# Patient Record
Sex: Female | Born: 1980 | Race: White | Hispanic: Yes | Marital: Married | Smoking: Never smoker
Health system: Southern US, Community
[De-identification: ages and names within clinical notes are randomized; demographics above are authoritative.]

## PROBLEM LIST (undated history)

## (undated) DIAGNOSIS — D649 Anemia, unspecified: Secondary | ICD-10-CM

## (undated) DIAGNOSIS — IMO0002 Reserved for concepts with insufficient information to code with codable children: Secondary | ICD-10-CM

## (undated) DIAGNOSIS — M329 Systemic lupus erythematosus, unspecified: Secondary | ICD-10-CM

---

## 2002-04-29 ENCOUNTER — Encounter (HOSPITAL_COMMUNITY): Admission: RE | Admit: 2002-04-29 | Discharge: 2002-05-01 | Payer: Self-pay | Admitting: *Deleted

## 2002-05-02 ENCOUNTER — Inpatient Hospital Stay (HOSPITAL_COMMUNITY): Admission: AD | Admit: 2002-05-02 | Discharge: 2002-05-05 | Payer: Self-pay | Admitting: Obstetrics and Gynecology

## 2004-11-24 DIAGNOSIS — Z8742 Personal history of other diseases of the female genital tract: Secondary | ICD-10-CM

## 2004-11-24 HISTORY — DX: Personal history of other diseases of the female genital tract: Z87.42

## 2005-10-08 ENCOUNTER — Ambulatory Visit (HOSPITAL_COMMUNITY): Admission: RE | Admit: 2005-10-08 | Discharge: 2005-10-08 | Payer: Self-pay | Admitting: *Deleted

## 2005-11-24 HISTORY — PX: DILATION AND CURETTAGE OF UTERUS: SHX78

## 2006-02-17 ENCOUNTER — Ambulatory Visit: Payer: Self-pay | Admitting: Obstetrics and Gynecology

## 2006-02-17 ENCOUNTER — Inpatient Hospital Stay (HOSPITAL_COMMUNITY): Admission: AD | Admit: 2006-02-17 | Discharge: 2006-02-19 | Payer: Self-pay | Admitting: *Deleted

## 2006-11-23 ENCOUNTER — Ambulatory Visit: Payer: Self-pay | Admitting: Gynecology

## 2006-11-23 ENCOUNTER — Encounter (INDEPENDENT_AMBULATORY_CARE_PROVIDER_SITE_OTHER): Payer: Self-pay | Admitting: *Deleted

## 2006-11-23 ENCOUNTER — Ambulatory Visit (HOSPITAL_COMMUNITY): Admission: AD | Admit: 2006-11-23 | Discharge: 2006-11-23 | Payer: Self-pay | Admitting: Obstetrics & Gynecology

## 2006-12-09 ENCOUNTER — Ambulatory Visit: Payer: Self-pay | Admitting: Obstetrics & Gynecology

## 2007-11-25 HISTORY — PX: LAPAROSCOPIC CHOLECYSTECTOMY: SUR755

## 2008-06-08 ENCOUNTER — Ambulatory Visit: Payer: Self-pay | Admitting: Nurse Practitioner

## 2008-06-08 ENCOUNTER — Ambulatory Visit: Payer: Self-pay | Admitting: *Deleted

## 2008-06-08 DIAGNOSIS — K5900 Constipation, unspecified: Secondary | ICD-10-CM | POA: Insufficient documentation

## 2008-06-08 DIAGNOSIS — K219 Gastro-esophageal reflux disease without esophagitis: Secondary | ICD-10-CM | POA: Insufficient documentation

## 2008-06-13 DIAGNOSIS — Z8711 Personal history of peptic ulcer disease: Secondary | ICD-10-CM

## 2008-06-13 LAB — CONVERTED CEMR LAB
AST: 17 units/L (ref 0–37)
Albumin: 4.6 g/dL (ref 3.5–5.2)
Alkaline Phosphatase: 79 units/L (ref 39–117)
Basophils Absolute: 0 10*3/uL (ref 0.0–0.1)
Basophils Relative: 0 % (ref 0–1)
CO2: 20 meq/L (ref 19–32)
Calcium: 9 mg/dL (ref 8.4–10.5)
Glucose, Bld: 85 mg/dL (ref 70–99)
HCT: 40.3 % (ref 36.0–46.0)
Helicobacter Pylori Antibody-IgG: 6.3 — ABNORMAL HIGH
Hemoglobin: 13.4 g/dL (ref 12.0–15.0)
Lymphocytes Relative: 31 % (ref 12–46)
Lymphs Abs: 2.9 10*3/uL (ref 0.7–4.0)
MCHC: 33.3 g/dL (ref 30.0–36.0)
MCV: 87.6 fL (ref 78.0–100.0)
Monocytes Relative: 5 % (ref 3–12)
Neutro Abs: 5.8 10*3/uL (ref 1.7–7.7)
Neutrophils Relative %: 63 % (ref 43–77)
Platelets: 201 10*3/uL (ref 150–400)
Potassium: 4.9 meq/L (ref 3.5–5.3)
RBC: 4.6 M/uL (ref 3.87–5.11)
Sodium: 139 meq/L (ref 135–145)
TSH: 1.099 microintl units/mL (ref 0.350–4.50)
Total Protein: 8 g/dL (ref 6.0–8.3)
WBC: 9.1 10*3/uL (ref 4.0–10.5)

## 2008-06-15 ENCOUNTER — Ambulatory Visit: Payer: Self-pay | Admitting: Nurse Practitioner

## 2008-06-21 ENCOUNTER — Ambulatory Visit (HOSPITAL_COMMUNITY): Admission: RE | Admit: 2008-06-21 | Discharge: 2008-06-21 | Payer: Self-pay | Admitting: Internal Medicine

## 2008-06-29 ENCOUNTER — Ambulatory Visit: Payer: Self-pay | Admitting: Nurse Practitioner

## 2008-08-21 ENCOUNTER — Ambulatory Visit (HOSPITAL_COMMUNITY): Admission: RE | Admit: 2008-08-21 | Discharge: 2008-08-21 | Payer: Self-pay | Admitting: General Surgery

## 2008-08-21 ENCOUNTER — Encounter (INDEPENDENT_AMBULATORY_CARE_PROVIDER_SITE_OTHER): Payer: Self-pay | Admitting: General Surgery

## 2008-08-21 DIAGNOSIS — Z9089 Acquired absence of other organs: Secondary | ICD-10-CM

## 2008-11-01 ENCOUNTER — Ambulatory Visit: Payer: Self-pay | Admitting: Nurse Practitioner

## 2008-11-01 LAB — CONVERTED CEMR LAB: Helicobacter Pylori Antibody-IgG: 3.1 — ABNORMAL HIGH

## 2008-11-02 ENCOUNTER — Encounter (INDEPENDENT_AMBULATORY_CARE_PROVIDER_SITE_OTHER): Payer: Self-pay | Admitting: Nurse Practitioner

## 2008-11-02 LAB — CONVERTED CEMR LAB: Helicobacter pylori, IgM: <0.89 (ref <0.89)

## 2008-11-06 ENCOUNTER — Encounter (INDEPENDENT_AMBULATORY_CARE_PROVIDER_SITE_OTHER): Payer: Self-pay | Admitting: Nurse Practitioner

## 2009-03-30 ENCOUNTER — Ambulatory Visit: Payer: Self-pay | Admitting: Nurse Practitioner

## 2009-03-30 DIAGNOSIS — J309 Allergic rhinitis, unspecified: Secondary | ICD-10-CM | POA: Insufficient documentation

## 2009-05-23 ENCOUNTER — Ambulatory Visit: Payer: Self-pay | Admitting: Nurse Practitioner

## 2009-05-23 ENCOUNTER — Encounter (INDEPENDENT_AMBULATORY_CARE_PROVIDER_SITE_OTHER): Payer: Self-pay | Admitting: Nurse Practitioner

## 2009-05-23 LAB — CONVERTED CEMR LAB
Bilirubin Urine: NEGATIVE
Blood in Urine, dipstick: NEGATIVE
Glucose, Urine, Semiquant: NEGATIVE
KOH Prep: NEGATIVE
Ketones, urine, test strip: NEGATIVE
Nitrite: NEGATIVE
Protein, U semiquant: NEGATIVE
Specific Gravity, Urine: >=1.03
Urobilinogen, UA: 0.2
WBC Urine, dipstick: NEGATIVE
pH: 5

## 2009-05-24 LAB — CONVERTED CEMR LAB
ALT: 16 units/L (ref 0–35)
AST: 17 units/L (ref 0–37)
Albumin: 4.4 g/dL (ref 3.5–5.2)
Alkaline Phosphatase: 78 units/L (ref 39–117)
BUN: 13 mg/dL (ref 6–23)
Basophils Absolute: 0 10*3/uL (ref 0.0–0.1)
Basophils Relative: 0 % (ref 0–1)
CO2: 23 meq/L (ref 19–32)
Calcium: 8.3 mg/dL — ABNORMAL LOW (ref 8.4–10.5)
Chlamydia, DNA Probe: NEGATIVE
Chloride: 107 meq/L (ref 96–112)
Cholesterol: 153 mg/dL (ref 0–200)
Creatinine, Ser: 0.62 mg/dL (ref 0.40–1.20)
Eosinophils Absolute: 0.1 10*3/uL (ref 0.0–0.7)
Eosinophils Relative: 2 % (ref 0–5)
GC Probe Amp, Genital: NEGATIVE
Glucose, Bld: 91 mg/dL (ref 70–99)
HCT: 36.6 % (ref 36.0–46.0)
HDL: 39 mg/dL — ABNORMAL LOW (ref >39)
Hemoglobin: 12.4 g/dL (ref 12.0–15.0)
LDL Cholesterol: 92 mg/dL (ref 0–99)
Lymphocytes Relative: 42 % (ref 12–46)
Lymphs Abs: 3.2 10*3/uL (ref 0.7–4.0)
MCHC: 33.9 g/dL (ref 30.0–36.0)
MCV: 85.5 fL (ref 78.0–100.0)
Monocytes Absolute: 0.4 10*3/uL (ref 0.1–1.0)
Monocytes Relative: 5 % (ref 3–12)
Neutro Abs: 3.9 10*3/uL (ref 1.7–7.7)
Neutrophils Relative %: 51 % (ref 43–77)
Platelets: 198 10*3/uL (ref 150–400)
Potassium: 4 meq/L (ref 3.5–5.3)
RBC: 4.28 M/uL (ref 3.87–5.11)
RDW: 13.3 % (ref 11.5–15.5)
Sodium: 140 meq/L (ref 135–145)
TSH: 1.41 microintl units/mL (ref 0.350–4.500)
Total Bilirubin: 0.3 mg/dL (ref 0.3–1.2)
Total CHOL/HDL Ratio: 3.9
Total Protein: 7.4 g/dL (ref 6.0–8.3)
Triglycerides: 111 mg/dL (ref <150)
VLDL: 22 mg/dL (ref 0–40)
WBC: 7.6 10*3/uL (ref 4.0–10.5)

## 2009-05-25 ENCOUNTER — Encounter (INDEPENDENT_AMBULATORY_CARE_PROVIDER_SITE_OTHER): Payer: Self-pay | Admitting: Nurse Practitioner

## 2009-05-29 ENCOUNTER — Encounter (INDEPENDENT_AMBULATORY_CARE_PROVIDER_SITE_OTHER): Payer: Self-pay | Admitting: Nurse Practitioner

## 2009-05-29 DIAGNOSIS — E559 Vitamin D deficiency, unspecified: Secondary | ICD-10-CM | POA: Insufficient documentation

## 2009-05-29 LAB — CONVERTED CEMR LAB: Vit D, 25-Hydroxy: 23 ng/mL — ABNORMAL LOW (ref 30–89)

## 2009-10-11 ENCOUNTER — Ambulatory Visit: Payer: Self-pay | Admitting: Nurse Practitioner

## 2009-10-12 ENCOUNTER — Encounter (INDEPENDENT_AMBULATORY_CARE_PROVIDER_SITE_OTHER): Payer: Self-pay | Admitting: Nurse Practitioner

## 2009-10-12 LAB — CONVERTED CEMR LAB: Vit D, 25-Hydroxy: 41 ng/mL (ref 30–89)

## 2010-02-14 ENCOUNTER — Ambulatory Visit: Payer: Self-pay | Admitting: Nurse Practitioner

## 2010-06-03 ENCOUNTER — Ambulatory Visit: Payer: Self-pay | Admitting: Nurse Practitioner

## 2010-06-03 ENCOUNTER — Other Ambulatory Visit: Admission: RE | Admit: 2010-06-03 | Discharge: 2010-06-03 | Payer: Self-pay | Admitting: Internal Medicine

## 2010-06-03 LAB — CONVERTED CEMR LAB
Bilirubin Urine: NEGATIVE
Blood in Urine, dipstick: NEGATIVE
Glucose, Urine, Semiquant: NEGATIVE
KOH Prep: NEGATIVE
Ketones, urine, test strip: NEGATIVE
Nitrite: NEGATIVE
Protein, U semiquant: NEGATIVE
pH: 5

## 2010-06-05 ENCOUNTER — Encounter (INDEPENDENT_AMBULATORY_CARE_PROVIDER_SITE_OTHER): Payer: Self-pay | Admitting: Nurse Practitioner

## 2010-06-05 LAB — CONVERTED CEMR LAB
ALT: 21 units/L (ref 0–35)
AST: 23 units/L (ref 0–37)
Alkaline Phosphatase: 90 units/L (ref 39–117)
Basophils Absolute: 0 10*3/uL (ref 0.0–0.1)
Basophils Relative: 0 % (ref 0–1)
CO2: 23 meq/L (ref 19–32)
Calcium: 9.2 mg/dL (ref 8.4–10.5)
Chlamydia, DNA Probe: NEGATIVE
Chloride: 105 meq/L (ref 96–112)
Creatinine, Ser: 0.55 mg/dL (ref 0.40–1.20)
Eosinophils Absolute: 0.1 10*3/uL (ref 0.0–0.7)
Eosinophils Relative: 1 % (ref 0–5)
GC Probe Amp, Genital: NEGATIVE
Glucose, Bld: 83 mg/dL (ref 70–99)
HCT: 38 % (ref 36.0–46.0)
Hemoglobin: 12.7 g/dL (ref 12.0–15.0)
MCHC: 33.4 g/dL (ref 30.0–36.0)
MCV: 88.4 fL (ref 78.0–100.0)
Monocytes Absolute: 0.5 10*3/uL (ref 0.1–1.0)
Monocytes Relative: 6 % (ref 3–12)
Neutro Abs: 4.4 10*3/uL (ref 1.7–7.7)
Neutrophils Relative %: 54 % (ref 43–77)
Pap Smear: NEGATIVE
Potassium: 4.5 meq/L (ref 3.5–5.3)
RBC: 4.3 M/uL (ref 3.87–5.11)
RDW: 13 % (ref 11.5–15.5)
Sodium: 139 meq/L (ref 135–145)
TSH: 1.112 microintl units/mL (ref 0.350–4.500)
Total Bilirubin: 0.4 mg/dL (ref 0.3–1.2)
Total Protein: 7.6 g/dL (ref 6.0–8.3)
Vit D, 25-Hydroxy: 18 ng/mL — ABNORMAL LOW (ref 30–89)
WBC: 8.3 10*3/uL (ref 4.0–10.5)

## 2010-06-07 ENCOUNTER — Encounter (INDEPENDENT_AMBULATORY_CARE_PROVIDER_SITE_OTHER): Payer: Self-pay | Admitting: *Deleted

## 2010-09-24 ENCOUNTER — Ambulatory Visit (HOSPITAL_COMMUNITY): Admission: RE | Admit: 2010-09-24 | Discharge: 2010-09-24 | Payer: Self-pay | Admitting: Family Medicine

## 2010-12-24 NOTE — Assessment & Plan Note (Signed)
Summary: Complete Physical Exam   Vital Signs:  Patient profile:   30 year old female LMP:     05/20/2010 Height:      60. inches Weight:      130.0 pounds BMI:     25.48 BSA:     1.56 Temp:     97.8 degrees F oral Pulse rate:   56 / minute Pulse rhythm:   regular Resp:     16 per minute BP sitting:   132 / 63  (left arm) Cuff size:   regular  Vitals Entered By: Levon Hedger (June 03, 2010 2:32 PM) CC: CPP Is Patient Diabetic? No Pain Assessment Patient in pain? no       Does patient need assistance? Functional Status Self care Ambulation Normal LMP (date): 05/20/2010 LMP - Character: normal     Menstrual flow (days): 5 Enter LMP: 05/20/2010 Last PAP Result  Specimen Adequacy: Satisfactory for evaluation.   Interpretation/Result:Negative for intraepithelial Lesion or Malignancy.      CC:  CPP.  History of Present Illness:  Pt into the office for a complete physical exam   PAP - last done 1 year ago in this office -normal. never had an abnormal pap 2 children ages 81 and 2.  Pt would like another child. married, no current contraception  Mammogram - never had a mammogram. No family history of breast cancer  Optho - no current eye glasses  Dental - no current dental exam - last 4 years ago.  Pt is doing well today - no acute complaints  Community liason/Spanish interpreter present today with pt -   Habits & Providers  Alcohol-Tobacco-Diet     Alcohol drinks/day: 0     Tobacco Status: never  Exercise-Depression-Behavior     Does Patient Exercise: no     Have you felt down or hopeless? no     Have you felt little pleasure in things? no     Depression Counseling: not indicated; screening negative for depression     Drug Use: no     Seat Belt Use: 100     Sun Exposure: occasionally  Comments: PHQ- 3  Allergies (verified): No Known Drug Allergies  Review of Systems General:  Denies fever. Eyes:  Denies blurring. ENT:  Denies  earache. CV:  Denies chest pain or discomfort. Resp:  Denies cough. GI:  Denies abdominal pain, indigestion, nausea, and vomiting. GU:  Denies discharge. MS:  Denies joint pain. Derm:  Denies rash. Neuro:  Denies headaches. Psych:  Denies anxiety and depression.  Physical Exam  General:  alert.   Head:  normocephalic.  hair evenly distributed over scalp Eyes:  pupils equal, pupils round, and pupils reactive to light.   Ears:  R ear normal and L ear normal.  bil TM with bony landmarks present Nose:  no nasal discharge.   Mouth:  pharynx pink and moist.   Neck:  supple.   Chest Wall:  no mass.   Breasts:  skin/areolae normal, no masses, and no abnormal thickening.   Lungs:  normal breath sounds.   Heart:  normal rate and regular rhythm.   Abdomen:  striae BS c 4, nontender Rectal:  no external abnormalities.   Msk:  normal ROM.   Pulses:  R radial normal.   Extremities:  no edema Neurologic:  alert & oriented X3.   Skin:  color normal.   Psych:  Oriented X3.    Pelvic Exam  Vulva:      normal  appearance.   Urethra and Bladder:      Urethra--normal.   Vagina:      physiologic discharge.   Cervix:      midposition.   Uterus:      smooth.   Adnexa:      nontender bilaterally.      Impression & Recommendations:  Problem # 1:  ROUTINE GYNECOLOGICAL EXAMINATION (ICD-V72.31) labs done except lipids as pt is NOT fasting PAP done self breast exams reviewed with pt rec optho and dental exams PHQ- 9 = 3 Orders: UA Dipstick w/o Micro (manual) (96045) KOH/ WET Mount [phone]) Pap Smear, Thin Prep ( Collection of) [phone]) T- GC Chlamydia (82956) T-Comprehensive Metabolic Panel (21308-65784) T-CBC w/Diff (69629-52841) T-HIV Antibody  (Reflex) (32440-10272) T-Syphilis Test (RPR) (53664-40347) T-TSH (42595-63875)  Complete Medication List: 1)  Allegra 180 Mg Tabs (Fexofenadine hcl) .... One tablet by mouth daily 2)  Fluticasone Propionate 50 Mcg/act Susp (Fluticasone  propionate) .... One spray in each nostril two times a day  Other Orders: T-Vitamin D 25-Hydroxy & 1,25 Dihydroxy (6433)  Patient Instructions: 1)  You will be notified of the results. 2)  Follow up as needed.  Laboratory Results   Urine Tests  Date/Time Received: June 03, 2010 2:49 PM   Routine Urinalysis   Glucose: negative   (Normal Range: Negative) Bilirubin: negative   (Normal Range: Negative) Ketone: negative   (Normal Range: Negative) Spec. Gravity: 1.010   (Normal Range: 1.003-1.035) Blood: negative   (Normal Range: Negative) pH: 5.0   (Normal Range: 5.0-8.0) Protein: negative   (Normal Range: Negative) Urobilinogen: 0.2   (Normal Range: 0-1) Nitrite: negative   (Normal Range: Negative) Leukocyte Esterace: trace   (Normal Range: Negative)    Date/Time Received: June 03, 2010 3:48 PM   Wet Mount/KOH Source: vaginal WBC/hpf: 1-5 Bacteria/hpf: rare Clue cells/hpf: none Yeast/hpf: none Trichomonas/hpf: none

## 2010-12-24 NOTE — Assessment & Plan Note (Signed)
Summary: Acute - Allergic Rhintis   Vital Signs:  Patient profile:   30 year old female Weight:      126.3 pounds BMI:     24.76 BSA:     1.54 Pulse rate:   70 / minute Pulse rhythm:   regular BP sitting:   109 / 73  (left arm) Cuff size:   regular  Vitals Entered By: Levon Hedger (February 14, 2010 9:54 AM) CC: left ear pain and blurry vision x 20 days ago Is Patient Diabetic? No Pain Assessment Patient in pain? no       Does patient need assistance? Functional Status Self care Ambulation Normal Comments pt is not taking any medications   CC:  left ear pain and blurry vision x 20 days ago.  History of Present Illness:  Pt into the office with c/o left ear pain. start 3 - 4 weeks ago no drainage no hearing loss +dizziness Left eye tearing at times but no pain +sneeze -cough slight headache  slight nasal congestion no fever Pt takes tylenol as needed for pain Pt seen in 03/2009 with similar symptoms. improved with medications and symtpoms returned about 1 month ago.  Social - still employed at [employer]  Allergies (verified): No Known Drug Allergies  Review of Systems General:  Denies fever. Eyes:  Complains of itching; left eye. ENT:  Complains of earache and nasal congestion; denies hoarseness and sore throat. CV:  Denies chest pain or discomfort. Resp:  Denies cough. Allergy:  Complains of sneezing.  Physical Exam  General:  alert.   Head:  normocephalic.   Eyes:  injected conjunctiva Ears:  bil ears with clear fluid - no erythema Mouth:  fair dentition.   Lungs:  normal breath sounds.   Heart:  normal rate and regular rhythm.   Msk:  normal ROM.   Neurologic:  alert & oriented X3.     Impression & Recommendations:  Problem # 1:  ALLERGIC RHINITIS (ICD-477.9)  advised pt of dx she can take allegra otc also can use nasal spray two times a day   Her updated medication list for this problem includes:    Allegra 180 Mg Tabs (Fexofenadine  hcl) ..... One tablet by mouth daily    Fluticasone Propionate 50 Mcg/act Susp (Fluticasone propionate) ..... One spray in each nostril two times a day  Complete Medication List: 1)  Allegra 180 Mg Tabs (Fexofenadine hcl) .... One tablet by mouth daily 2)  Fluticasone Propionate 50 Mcg/act Susp (Fluticasone propionate) .... One spray in each nostril two times a day  Patient Instructions: 1)  Symptoms are from allergies. This time of the year with flowers blooming, trees budding and pollen getting ready to fall cause some irritation to your sinuses. 2)  Allegra 180mg  by mouth daily - you can buy this over the counter 3)  Flonase - one spray in each nostril two times a day (hold head down) 4)  Follow up as needed Prescriptions: FLUTICASONE PROPIONATE 50 MCG/ACT SUSP (FLUTICASONE PROPIONATE) One spray in each nostril two times a day  #1 x 1   Entered and Authorized by:   Lehman Prom FNP   Signed by:   Lehman Prom FNP on 02/14/2010   Method used:   Print then Give to Patient   RxID:   [id number]

## 2010-12-24 NOTE — Letter (Signed)
Summary: Handout Printed  Printed Handout:  - Seasonal (Allergic) Rhinitis 

## 2010-12-24 NOTE — Letter (Signed)
Summary: *HSN Results Follow up  [facility]  [address] [place], [place] [address]   Phone: [phone]  Fax: [phone]      06/07/2010   Kyree BARBER [address] [place], [place]  [address]   Dear  Ms. Yvonda GUERRERO-VAZQUEZ,                            ____S.Drinkard,FNP   ____D. Gore,FNP       ____B. McPherson,MD   ____V. Rankins,MD    ____E. Mulberry,MD    ____N. Daphine Deutscher, FNP  ____D. Reche Dixon, MD    ____K. Philipp Deputy, MD    ____Other     This letter is to inform you that your recent test(s):  _______Pap Smear    _______Lab Test     _______X-ray    _______ is within acceptable limits  ___X____ requires a medication change  _______ requires a follow-up lab visit  _______ requires a follow-up visit with your provider   Comments:  We have been trying to reach you at [phone].  Please contact the office at your earliest convenience.       _________________________________________________________ If you have any questions, please contact our office                     Sincerely,  Levon Hedger [facility]

## 2010-12-24 NOTE — Progress Notes (Signed)
Summary: Office Visit/DEPRESSION SCREENING  Office Visit/DEPRESSION SCREENING   Imported By: Arta Bruce 06/04/2010 09:58:28  _____________________________________________________________________  External Attachment:    Type:   Image     Comment:   External Document

## 2010-12-24 NOTE — Letter (Signed)
Summary: *HSN Results Follow up  [facility]  [address] [place], [place] [address]   Phone: [phone]  Fax: [phone]      06/05/2010   Audrey [name] [address] [place], [place]  [address]   Dear  [name],                            ____S.Drinkard,FNP   ____D. Gore,FNP       ____B. McPherson,MD   ____V. Rankins,MD    ____E. Mulberry,MD    __X__N. Daphine Deutscher, FNP  ____D. Reche Dixon, MD    ____K. Philipp Deputy, MD    ____Other     This letter is to inform you that your recent test(s):  ___X____Pap Smear    ___X____Lab Test     _______X-ray    ___X____ is within acceptable limits  _______ requires a medication change  _______ requires a follow-up lab visit  _______ requires a follow-up visit with your provider   Comments: Labs done during recent office visit show that your vitamin D level is low.  You can take over the counter vitamin D supplement by mouth  two times a day.  Pap Smear results normal.     _________________________________________________________ If you have any questions, please contact our office [phone].                    Sincerely,    Lehman Prom FNP [facility]

## 2011-02-18 ENCOUNTER — Inpatient Hospital Stay (HOSPITAL_COMMUNITY)
Admission: AD | Admit: 2011-02-18 | Discharge: 2011-02-20 | DRG: 775 | Disposition: A | Discharge status: Home / Self Care | Payer: Medicaid Other | Source: Ambulatory Visit | Attending: Obstetrics & Gynecology | Admitting: Obstetrics & Gynecology

## 2011-02-18 ENCOUNTER — Inpatient Hospital Stay (HOSPITAL_COMMUNITY): Admission: AD | Admit: 2011-02-18 | Payer: Self-pay | Admitting: Family Medicine

## 2011-02-18 LAB — CBC
HCT: 40.2 % (ref 36.0–46.0)
Hemoglobin: 13.9 g/dL (ref 12.0–15.0)
MCHC: 34.6 g/dL (ref 30.0–36.0)
MCV: 87.8 fL (ref 78.0–100.0)
Platelets: 184 10*3/uL (ref 150–400)
RBC: 4.58 MIL/uL (ref 3.87–5.11)
RDW: 13.8 % (ref 11.5–15.5)
WBC: 9.4 10*3/uL (ref 4.0–10.5)

## 2011-02-18 LAB — RPR: RPR Ser Ql: REACTIVE — AB

## 2011-02-18 LAB — RPR TITER: RPR Titer: 1:1 {titer}

## 2011-02-19 LAB — T.PALLIDUM AB, IGG: T pallidum Antibodies (TP-PA): 0.03 S/CO (ref <0.90)

## 2011-04-08 NOTE — Op Note (Signed)
Audrey [name], Audrey [name]      ACCOUNT NO.:  [id number]   MEDICAL RECORD NO.:  [id number]          PATIENT TYPE:  AMB   LOCATION:  DAY                          FACILITY:  [facility]   PHYSICIAN:  Almond Lint, MD       DATE OF BIRTH:  05/24/1981   DATE OF PROCEDURE:  08/20/2008  DATE OF DISCHARGE:                               OPERATIVE REPORT   PREOPERATIVE DIAGNOSIS:  Symptomatic cholelithiasis.   POSTOPERATIVE DIAGNOSIS:  Chronic cholecystitis   PROCEDURE:  Laparoscopic cholecystectomy with intraoperative  cholangiogram.   SURGEON:  Almond Lint, MD   ASSISTANT:  Dr. Lurene Shadow.   ANESTHESIA:  General and local.   FINDINGS:  Chronically inflamed gallbladder with a large stone lodged in  the neck and pus in the gallbladder.   SPECIMEN:  Gallbladder to pathology.   ESTIMATED BLOOD LOSS:  25 mL.   COMPLICATIONS:  None.   DESCRIPTION OF PROCEDURE:  [name] is a 30 year old female who was  identified in the holding area and taken to the operating room where she  was placed supine on the operating room table.  General anesthesia was  induced.  Her abdomen was prepped and draped in a sterile fashion.  The  infraumbilical skin was infiltrated with local anesthesia.  A vertical  incision was made in the midline.  A Tresa Endo was used to spread the skin  and a curved clamp used to elevate the umbilical stalk.  A Kocher was  then placed on either side of the midline and the fascia entered sharply  with a knife.  A hemostat was inserted into the fascial opening and it  was ascertained that still the peritoneum that had been not yet been  entered.  This was elevated with two hemostats and the peritoneum was  then divided.  A pursestring suture was created with a 0 Vicryl on a UR6  needle.  The [facility] trocar was then advanced into the abdomen and  pneumoperitoneum was achieved to a pressure of 50 mmHg.  An 11 mm  epigastric port and two 5 mm ports in the right upper quadrant were  placed under direct visualization after infiltration with lidocaine.   The gallbladder was grasped and elevated superiorly at the fundus.  It  was noted to be extremely inflamed and quite tense.  The infundibulum  was then retracted laterally.  The [place] dissector was used to strip  the peritoneum at the infundibulum of the gallbladder.  A tubular  structure directly under the gallbladder was identified at the  infundibulum.  This structure was skeletonized in order to see it  directly entering the gallbladder.  This was clipped on the specimen  side and a ductotomy was made with endoshears.  The catheter was  advanced through the abdominal wall and into the ductotomy.  This was  clipped into place and a cholangiogram was shot after allowing  pneumoperitoneum to evacuate and the patient to be placed flat.  The  cholangiogram demonstrated right and left hepatic ducts and a reasonably  short cystic duct.  The common bile duct filled to the duodenum.   The pneumoperitoneum was again achieved and the  clip was removed holding  the cholangiocatheter in place.  The cystic duct was then clipped twice  on the patient's side.  The scissors were used to transect the duct the  rest of the way and the hook cautery was used to dissect further up the  gallbladder toward the gallbladder fossa.  The artery was located and  was clipped twice on the patient's side and once on the specimen side.  The hook electrocautery was used to take the gallbladder from the  gallbladder fossa.  The gallbladder was entered at one point and there  was noted to be very purulent bile in the gallbladder.  This was also  noted to be quite hyperemic in several spots had to be coagulated with  the Bovie.  Once the gallbladder was completely removed from the  gallbladder fossa, it was placed in an EndoCatch bag and removed through  the umbilicus.  The [facility] trocar was reintroduced after doing this and  the gallbladder fossa  inspected.  There was one site of bleeding in the  gallbladder fossa which was coagulated.  This was then irrigated  copiously, as well as over the liver.  There was no additional bleeding  seen.  The pursestring was used to close the fascial defect at the  umbilicus and digital palpation confirmed that this indeed did the  trick.  The skin was closed using 4-0 Monocryl.  The incisions were  cleaned and dried and then dressed with Dermabond.  The patient was  awakened from anesthesia and taken to the PACU in stable condition.      Almond Lint, MD  Electronically Signed     FB/MEDQ  D:  08/21/2008  T:  08/21/2008  Job:  [id number]

## 2011-04-11 NOTE — Op Note (Signed)
Audrey [name], Audrey [name] NO.:  [id number]   MEDICAL RECORD NO.:  [id number]          PATIENT TYPE:  MAT   LOCATION:  [facility]C                          [facility]ILITY:  [facility]   PHYSICIAN:  Ginger Carne, MD  DATE OF BIRTH:  Mar 10, 1981   DATE OF PROCEDURE:  11/23/2006  DATE OF DISCHARGE:                               OPERATIVE REPORT   .   PREOPERATIVE DIAGNOSIS:  Second trimester incomplete abortion.   POSTOPERATIVE DIAGNOSIS:  Second trimester incomplete abortion.   PROCEDURE:  Aspiration, dilatation and extraction.   SURGEON:  Blima Rich, M.D.   ASSISTANT:  None.   COMPLICATIONS:  None immediate.   ESTIMATED BLOOD LOSS:  150-200 mL.   ANESTHESIA:  General.   OPERATIVE FINDINGS:  The fetus on observation was approximately 18 weeks  in size was at the perineum. The umbilical cord was attached, and this  was cut to facilitate removal of said fetus.  Products of conception  including placenta were in the uterus which palpated to 18 weeks.  Both  adnexa palpable and found to be normal.  The cervix smooth without  erosions or lesions.  Products of conception and fetus and placenta to  pathology.   OPERATIVE PROCEDURE:  The patient prepped and draped in usual fashion  and placed in the lithotomy position.  Betadine solution used for  antiseptic, and the patient was catheterized prior to procedure.  After  adequate general anesthesia, tenaculum placed on the anterior lip of the  cervix.  The fetus was delivered without difficulty after cutting the  umbilical cord.  Using a #16 suction curette, suction and sharp  curettage followed.  All products of conception including placenta were  removed.  Cavity smooth at the end of the procedure.  No active bleeding  noted.  No injuries noted.  The patient tolerated the procedure well and  returned to post anesthesia recovery room in excellent condition.  All  specimen to pathology.      Ginger Carne, MD  Electronically Signed     SHB/MEDQ  D:  11/23/2006  T:  11/23/2006  Job:  [id number]

## 2011-04-11 NOTE — Group Therapy Note (Signed)
NAME:  Audrey [name], Audrey [name] NO.:  [id number]   MEDICAL RECORD NO.:  [id number]          PATIENT TYPE:  WOC   LOCATION:  [facility]                   FACILITY:  [facility]   PHYSICIAN:  Dorthula Perfect, MD     DATE OF BIRTH:  07-29-1981   DATE OF SERVICE:  12/09/2006                                  CLINIC NOTE   A 30 year old Hispanic female who is back for a post op surgical check.  She is gravida 3, para 2 and now aborta 1. She was admitted here  December 31, with an inevitable second trimester abortion. She presented  with vaginal bleeding and the placenta in the cervical os. She underwent  an aspiration dilatation and extraction by Dr. Mia Creek.   Vaginal bleeding post operatively only lasted a few days. She is  asymptomatic today. She has not had sexual intercourse.   PHYSICAL EXAMINATION:  Height 5 feet, 1 inches. Weight 123, blood  pressure 112/72.  ABDOMEN: Soft and nontender. No masses are felt.  PELVIC: External genitalia and BUS glands are normal. Vaginal vault is  epithelialized. Cervix is epithelialized. There is no bleeding noted.  The uterus is in midline and is of normal size and shape. The adnexal  structures were normal.   IMPRESSION:  Status post D&E.   DISPOSITION:  The patient will return in 4 weeks for a Mirena IUD at her  request. She and her husband are advised to use condoms in the interim.           ______________________________  Dorthula Perfect, MD     ER/MEDQ  D:  12/09/2006  T:  12/09/2006  Job:  [id number]

## 2011-08-25 LAB — PREGNANCY, URINE: Preg Test, Ur: NEGATIVE

## 2011-08-25 LAB — HEMOGLOBIN AND HEMATOCRIT, BLOOD
HCT: 39.2
Hemoglobin: 13.4

## 2015-03-11 ENCOUNTER — Emergency Department (HOSPITAL_COMMUNITY)
Admission: EM | Admit: 2015-03-11 | Discharge: 2015-03-11 | Disposition: A | Discharge status: Home / Self Care | Payer: Worker's Compensation | Attending: Emergency Medicine | Admitting: Emergency Medicine

## 2015-03-11 ENCOUNTER — Encounter (HOSPITAL_COMMUNITY): Payer: Self-pay | Admitting: Emergency Medicine

## 2015-03-11 DIAGNOSIS — Y288XXA Contact with other sharp object, undetermined intent, initial encounter: Secondary | ICD-10-CM | POA: Insufficient documentation

## 2015-03-11 DIAGNOSIS — Y93G1 Activity, food preparation and clean up: Secondary | ICD-10-CM | POA: Insufficient documentation

## 2015-03-11 DIAGNOSIS — Z23 Encounter for immunization: Secondary | ICD-10-CM | POA: Diagnosis not present

## 2015-03-11 DIAGNOSIS — Y990 Civilian activity done for income or pay: Secondary | ICD-10-CM | POA: Diagnosis not present

## 2015-03-11 DIAGNOSIS — Y92000 Kitchen of unspecified non-institutional (private) residence as  the place of occurrence of the external cause: Secondary | ICD-10-CM | POA: Diagnosis not present

## 2015-03-11 DIAGNOSIS — L988 Other specified disorders of the skin and subcutaneous tissue: Secondary | ICD-10-CM | POA: Insufficient documentation

## 2015-03-11 DIAGNOSIS — S6992XA Unspecified injury of left wrist, hand and finger(s), initial encounter: Secondary | ICD-10-CM | POA: Diagnosis present

## 2015-03-11 DIAGNOSIS — S61211A Laceration without foreign body of left index finger without damage to nail, initial encounter: Secondary | ICD-10-CM | POA: Insufficient documentation

## 2015-03-11 DIAGNOSIS — T798XXA Other early complications of trauma, initial encounter: Secondary | ICD-10-CM

## 2015-03-11 MED ORDER — SULFAMETHOXAZOLE-TRIMETHOPRIM 800-160 MG PO TABS: 1.0000 | ORAL_TABLET | Freq: Two times a day (BID) | ORAL | Status: AC

## 2015-03-11 MED ORDER — TETANUS-DIPHTH-ACELL PERTUSSIS 5-2.5-18.5 LF-MCG/0.5 IM SUSP: 0.5000 mL | Freq: Once | INTRAMUSCULAR | Status: DC

## 2015-03-11 MED ORDER — SULFAMETHOXAZOLE-TRIMETHOPRIM 800-160 MG PO TABS
1.0000 | ORAL_TABLET | Freq: Once | ORAL | Status: AC
  Administered 2015-03-11: 1 via ORAL
  Filled 2015-03-11: qty 1

## 2015-03-11 MED ORDER — TETANUS-DIPHTH-ACELL PERTUSSIS 5-2.5-18.5 LF-MCG/0.5 IM SUSP
0.5000 mL | Freq: Once | INTRAMUSCULAR | Status: AC
  Administered 2015-03-11: 0.5 mL via INTRAMUSCULAR
  Filled 2015-03-11: qty 0.5

## 2015-03-11 NOTE — ED Notes (Signed)
C/o wound infection to L hand. 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  Area is red, painful, and swollen.  Difficulty bending fingers today.

## 2015-03-11 NOTE — ED Provider Notes (Signed)
CSN: [id number]     Arrival date & time 03/11/15  2002 History   First MD Initiated Contact with Patient 03/11/15 2033     Chief Complaint  Patient presents with  . Wound Infection     (Consider location/radiation/quality/duration/timing/severity/associated sxs/prior Treatment) Patient is a 34 y.o. female presenting with skin laceration. The history is provided by the patient. No language interpreter was used.  Laceration Location:  Finger Finger laceration location:  L index finger Length (cm):  1cm Depth:  Through dermis Time since incident:  3 days Pain details:    Quality:  Aching   Severity:  Moderate   Timing:  Constant   Progression:  Worsening Foreign body present:  No foreign bodies Relieved by:  Nothing Worsened by:  Nothing tried Ineffective treatments:  None tried   History reviewed. No pertinent past medical history. History reviewed. No pertinent past surgical history. No family history on file. History  Substance Use Topics  . Smoking status: Never Smoker   . Smokeless tobacco: Not on file  . Alcohol Use: No   OB History    No data available     Review of Systems  Skin: Positive for wound.  All other systems reviewed and are negative.     Allergies  Review of patient's allergies indicates not on file.  Home Medications   Prior to Admission medications   Not on File   BP 129/61 mmHg  Pulse 74  Temp(Src) 97.7 F (36.5 C) (Oral)  Resp 16  Ht 5\' 2"  (1.575 m)  Wt 141 lb (63.957 kg)  BMI 25.78 kg/m2  SpO2 100%  LMP 03/10/2015 Physical Exam  Constitutional: She is oriented to person, place, and time. She appears well-developed and well-nourished.  HENT:  Head: Normocephalic.  Musculoskeletal: She exhibits tenderness.  1cm laceration left index finger, slight erythema,  No obvious abscess,   Pt has some discomfort with making a fist.   nv and ns intact  Neurological: She is alert and oriented to person, place, and time. She has normal  reflexes.  Skin: There is erythema.  Psychiatric: She has a normal mood and affect.  Nursing note and vitals reviewed.   ED Course  Procedures (including critical care time) Labs Review Labs Reviewed - No data to display  Imaging Review No results found.   EKG Interpretation None      MDM no fever, no foreign body history, no sign of abscess Pt counseled on infection and need for follow up.  Pt advised to recheck    Final diagnoses:  Laceration of left index finger w/o foreign body w/o damage to nail, initial encounter  Wound infection, initial encounter    Bactrim     Elson AreasLeslie K Sofia, PA-C 03/11/15 2100  Jerelyn ScottMartha Linker, MD 03/11/15 2101

## 2018-02-26 LAB — POCT LIPID PANEL
HDL: 30
LDL: 52
TC: 132
TRG: 248

## 2018-02-26 LAB — GLUCOSE, POCT (MANUAL RESULT ENTRY): POC Glucose: 108 mg/dl — AB (ref 70–99)

## 2018-07-06 ENCOUNTER — Other Ambulatory Visit: Payer: Self-pay

## 2018-07-06 ENCOUNTER — Emergency Department (HOSPITAL_COMMUNITY)
Admission: EM | Admit: 2018-07-06 | Discharge: 2018-07-07 | Disposition: A | Discharge status: Home / Self Care | Payer: Self-pay | Attending: Emergency Medicine | Admitting: Emergency Medicine

## 2018-07-06 ENCOUNTER — Encounter (HOSPITAL_COMMUNITY): Payer: Self-pay

## 2018-07-06 DIAGNOSIS — J029 Acute pharyngitis, unspecified: Secondary | ICD-10-CM | POA: Insufficient documentation

## 2018-07-06 DIAGNOSIS — Z9049 Acquired absence of other specified parts of digestive tract: Secondary | ICD-10-CM | POA: Insufficient documentation

## 2018-07-06 DIAGNOSIS — M7918 Myalgia, other site: Secondary | ICD-10-CM | POA: Insufficient documentation

## 2018-07-06 LAB — CBC WITH DIFFERENTIAL/PLATELET
Abs Immature Granulocytes: 0 10*3/uL (ref 0.0–0.1)
Basophils Absolute: 0 10*3/uL (ref 0.0–0.1)
Basophils Relative: 0 %
EOS ABS: 0 10*3/uL (ref 0.0–0.7)
EOS PCT: 0 %
HEMATOCRIT: 34.7 % — AB (ref 36.0–46.0)
Hemoglobin: 11.4 g/dL — ABNORMAL LOW (ref 12.0–15.0)
IMMATURE GRANULOCYTES: 0 %
LYMPHS ABS: 1.1 10*3/uL (ref 0.7–4.0)
Lymphocytes Relative: 20 %
MCH: 27.1 pg (ref 26.0–34.0)
MCHC: 32.9 g/dL (ref 30.0–36.0)
MCV: 82.6 fL (ref 78.0–100.0)
MONO ABS: 0.2 10*3/uL (ref 0.1–1.0)
Monocytes Relative: 3 %
Neutro Abs: 4.1 10*3/uL (ref 1.7–7.7)
Neutrophils Relative %: 77 %
Platelets: 234 10*3/uL (ref 150–400)
RBC: 4.2 MIL/uL (ref 3.87–5.11)
RDW: 13.1 % (ref 11.5–15.5)
WBC: 5.3 10*3/uL (ref 4.0–10.5)

## 2018-07-06 LAB — COMPREHENSIVE METABOLIC PANEL
ALT: 21 U/L (ref 0–44)
AST: 26 U/L (ref 15–41)
Albumin: 3.5 g/dL (ref 3.5–5.0)
Alkaline Phosphatase: 77 U/L (ref 38–126)
Anion gap: 9 (ref 5–15)
BILIRUBIN TOTAL: 0.7 mg/dL (ref 0.3–1.2)
BUN: 8 mg/dL (ref 6–20)
CO2: 23 mmol/L (ref 22–32)
Calcium: 8.4 mg/dL — ABNORMAL LOW (ref 8.9–10.3)
Chloride: 99 mmol/L (ref 98–111)
Creatinine, Ser: 0.7 mg/dL (ref 0.44–1.00)
GFR calc Af Amer: >60 mL/min (ref >60)
Glucose, Bld: 94 mg/dL (ref 70–99)
POTASSIUM: 4.3 mmol/L (ref 3.5–5.1)
Sodium: 131 mmol/L — ABNORMAL LOW (ref 135–145)
Total Protein: 8.4 g/dL — ABNORMAL HIGH (ref 6.5–8.1)

## 2018-07-06 LAB — URINALYSIS, ROUTINE W REFLEX MICROSCOPIC
Bilirubin Urine: NEGATIVE
Glucose, UA: NEGATIVE mg/dL
Ketones, ur: 5 mg/dL — AB
Leukocytes, UA: NEGATIVE
Nitrite: NEGATIVE
PROTEIN: NEGATIVE mg/dL
SPECIFIC GRAVITY, URINE: 1.012 (ref 1.005–1.030)
pH: 6 (ref 5.0–8.0)

## 2018-07-06 LAB — PREGNANCY, URINE: Preg Test, Ur: NEGATIVE

## 2018-07-06 LAB — GROUP A STREP BY PCR: Group A Strep by PCR: NOT DETECTED

## 2018-07-06 MED ORDER — SODIUM CHLORIDE 0.9 % IV BOLUS
1000.0000 mL | Freq: Once | INTRAVENOUS | Status: AC
  Administered 2018-07-06: 1000 mL via INTRAVENOUS

## 2018-07-06 MED ORDER — ACETAMINOPHEN 500 MG PO TABS
500.0000 mg | ORAL_TABLET | Freq: Once | ORAL | Status: AC
  Administered 2018-07-06: 500 mg via ORAL
  Filled 2018-07-06: qty 1

## 2018-07-06 MED ORDER — DEXAMETHASONE SODIUM PHOSPHATE 10 MG/ML IJ SOLN
10.0000 mg | Freq: Once | INTRAMUSCULAR | Status: AC
  Administered 2018-07-06: 10 mg via INTRAVENOUS
  Filled 2018-07-06: qty 1

## 2018-07-06 MED ORDER — KETOROLAC TROMETHAMINE 15 MG/ML IJ SOLN
15.0000 mg | Freq: Once | INTRAMUSCULAR | Status: AC
  Administered 2018-07-06: 15 mg via INTRAVENOUS
  Filled 2018-07-06: qty 1

## 2018-07-06 NOTE — ED Provider Notes (Addendum)
[facility] Provider Note   CSN: [id number] Arrival date & time: 07/06/18  1729     History   Chief Complaint Chief Complaint  Patient presents with  . Sore Throat  . Generalized Body Aches    HPI [employer] translation services were used during this visit. Audrey [name] is a 37 y.o. female presenting for 5 days of sore throat and body aches.  Patient states that the pain began gradually and describes it as a sharp burning that is worse with swallowing.  Patient also endorses chills however denies measuring a fever at home.  Patient states that she has taken ibuprofen for her pain with some relief.  Patient describes her pain as 6/10 in severity at this time.  Patient states that she has had nausea with some vomiting this morning, she denies hematemesis, she states that she had a small amount of throw up.  She states that she has been eating and drinking since vomiting this morning without difficulty.  Denies voice changes, drooling, facial or neck swelling.  HPI  History reviewed. No pertinent past medical history.  Patient Active Problem List   Diagnosis Date Noted  . VITAMIN D DEFICIENCY 05/29/2009  . ALLERGIC RHINITIS 03/30/2009  . CHOLECYSTECTOMY, HX OF 08/21/2008  . HELICOBACTER PYLORI INFECTION, HX OF 06/13/2008  . GERD 06/08/2008  . CONSTIPATION 06/08/2008    History reviewed. No pertinent surgical history.   OB History   None      Home Medications    Prior to Admission medications   Not on File    Family History History reviewed. No pertinent family history.  Social History Social History   Tobacco Use  . Smoking status: Never Smoker  Substance Use Topics  . Alcohol use: No  . Drug use: No     Allergies   Patient has no known allergies.   Review of Systems Review of Systems  Constitutional: Positive for chills. Negative for fatigue and fever.  HENT: Positive for sore throat. Negative for  drooling, facial swelling, rhinorrhea and voice change.   Eyes: Negative.  Negative for visual disturbance.  Respiratory: Negative.  Negative for cough and shortness of breath.   Cardiovascular: Negative.  Negative for chest pain.  Gastrointestinal: Positive for nausea and vomiting. Negative for abdominal pain, blood in stool and diarrhea.  Genitourinary: Negative.  Negative for dysuria, hematuria, pelvic pain, vaginal bleeding and vaginal discharge.  Musculoskeletal: Negative.  Negative for arthralgias and myalgias.  Skin: Negative.  Negative for rash.  Neurological: Negative.  Negative for dizziness, syncope, weakness, light-headedness and headaches.     Physical Exam Updated Vital Signs BP 100/64   Pulse 81   Temp 99 F (37.2 C) (Oral)   Resp 16   LMP  (LMP Unknown)   SpO2 100%   Physical Exam  Constitutional: She is oriented to person, place, and time. She appears well-developed and well-nourished. No distress.  HENT:  Head: Normocephalic and atraumatic.  Right Ear: Hearing, tympanic membrane, external ear and ear canal normal.  Left Ear: Hearing, tympanic membrane, external ear and ear canal normal.  Nose: Nose normal.  Mouth/Throat: Uvula is midline and mucous membranes are normal. No uvula swelling. No posterior oropharyngeal edema, posterior oropharyngeal erythema or tonsillar abscesses. Tonsils are 3+ on the right. Tonsils are 1+ on the left. Tonsillar exudate.  Patient with 3+ swelling to the right tonsil, tonsils not touching the uvula, exudates present on right tonsil.   Eyes: Pupils are equal, round,  and reactive to light. EOM are normal.  Neck: Trachea normal and normal range of motion. Neck supple. No tracheal deviation present.  Cardiovascular: Normal rate, regular rhythm, normal heart sounds and intact distal pulses.  Pulmonary/Chest: Effort normal and breath sounds normal. No respiratory distress.  Abdominal: Soft. There is no tenderness. There is no rebound and  no guarding.  Musculoskeletal: Normal range of motion.  Lymphadenopathy:    She has cervical adenopathy.  Neurological: She is alert and oriented to person, place, and time.  Skin: Skin is warm and dry.  Psychiatric: She has a normal mood and affect. Her behavior is normal.     ED Treatments / Results  Labs (all labs ordered are listed, but only abnormal results are displayed) Labs Reviewed  CBC WITH DIFFERENTIAL/PLATELET - Abnormal; Notable for the following components:      Result Value   Hemoglobin 11.4 (*)    HCT 34.7 (*)    All other components within normal limits  COMPREHENSIVE METABOLIC PANEL - Abnormal; Notable for the following components:   Sodium 131 (*)    Calcium 8.4 (*)    Total Protein 8.4 (*)    All other components within normal limits  URINALYSIS, ROUTINE W REFLEX MICROSCOPIC - Abnormal; Notable for the following components:   Hgb urine dipstick SMALL (*)    Ketones, ur 5 (*)    Bacteria, UA RARE (*)    All other components within normal limits  GROUP A STREP BY PCR  CULTURE, GROUP A STREP [facility])  PREGNANCY, URINE    EKG None  Radiology Ct Soft Tissue Neck W Contrast  Result Date: 07/07/2018 CLINICAL DATA:  Sore throat for 5 days, chills and fever. Tachycardia. EXAM: CT NECK WITH CONTRAST TECHNIQUE: Multidetector CT imaging of the neck was performed using the standard protocol following the bolus administration of intravenous contrast. CONTRAST:  75mL OMNIPAQUE IOHEXOL 300 MG/ML  SOLN COMPARISON:  None. FINDINGS: PHARYNX AND LARYNX: Normal.  Widely patent airway. SALIVARY GLANDS: Symmetric aplastic submandibular glands. Normal remaining major salivary glands. THYROID: Normal. LYMPH NODES: Greater than expected number of borderline enlarged cervical lymph nodes. VASCULAR: Normal. LIMITED INTRACRANIAL: Normal. VISUALIZED ORBITS: Normal. MASTOIDS AND VISUALIZED PARANASAL SINUSES: Trace maxillary sinus mucosal thickening without air-fluid levels. Mastoid air  cells are well aerated. SKELETON: Nonacute. UPPER CHEST: Lung apices are clear. No superior mediastinal lymphadenopathy. OTHER: None. IMPRESSION: 1. No acute tonsillitis or epiglottitis.  Patent airway. 2. Borderline cervical lymphadenopathy is likely reactive. 3. Symmetric aplastic submandibular glands. Electronically Signed   By: Awilda Metro M.D.   On: 07/07/2018 00:48    Procedures Procedures (including critical care time)  Medications Ordered in ED Medications  sodium chloride 0.9 % bolus 1,000 mL (0 mLs Intravenous Stopped 07/06/18 2123)  acetaminophen (TYLENOL) tablet 500 mg (500 mg Oral Given 07/06/18 2038)  dexamethasone (DECADRON) injection 10 mg (10 mg Intravenous Given 07/06/18 2218)  ketorolac (TORADOL) 15 MG/ML injection 15 mg (15 mg Intravenous Given 07/06/18 2218)  sodium chloride 0.9 % bolus 1,000 mL (0 mLs Intravenous Stopped 07/07/18 0023)  iohexol (OMNIPAQUE) 300 MG/ML solution 75 mL (75 mLs Intravenous Contrast Given 07/07/18 0026)     Initial Impression / Assessment and Plan / ED Course  I have reviewed the triage vital signs and the nursing notes.  Pertinent labs & imaging results that were available during my care of the patient were reviewed by me and considered in my medical decision making (see chart for details).  Clinical Course as of Jul 08 123  Tue Jul 06, 2018  2159 Discussed with Dr. Rush Landmarkegeler, Decadron and Toradol for symptomatic relief.  Questionable PTA on right side, CT soft tissue neck with contrast ordered.   [BM]  Wed Jul 07, 2018  0017 On reevaluation patient states that she is feeling much better, denying any no pain at this time.  Awaiting CT scan.   [BM]  0106 IMPRESSION: 1. No acute tonsillitis or epiglottitis. Patent airway. 2. Borderline cervical lymphadenopathy is likely reactive. 3. Symmetric aplastic submandibular glands.   Electronically Signed By: Awilda Metroourtnay Bloomer M.D. On: 07/07/2018 00:48   [BM]    Clinical Course User  Index [BM] Bill SalinasMorelli, Cobey Raineri A, PA-C   Patient presenting with Sore Throat for 5 days.  Patient is afebrile, negative strep.  Patient treated with Decadron and Toradol, fluid boluses and Tylenol here in department.  Patient is resting comfortably in bed, denying any and all pain upon reevaluation.  Airway intact, patient tolerating oral liquids without difficulty.  Presents with mild cervical lymphadenopathy & dysphagia; diagnosis of viral pharyngitis. No antibiotics indicated at this time.  Strep culture culture has been sent off, patient will be contacted by [facility] if positive results.  CMP nonacute, CBC nonacute, urinalysis nonacute, pregnancy negative.  Patient does not appear dehydrated, but did discuss importance of water rehydration.  Imaging negative for tonsillitis or epiglottitis. Patient is able to drink water in ED without difficulty with intact air way.  Patient counseled on conservative home remedies for sore throat.  Patient informed that she may continue to use Tylenol for her pain.  Patient informed to drink plenty of water to avoid dehydration.  At this time there does not appear to be any evidence of an acute emergency medical condition and the patient appears stable for discharge with appropriate outpatient follow up. Diagnosis was discussed with patient who verbalizes understanding of care plan and is agreeable to discharge. I have discussed return precautions with patient and family who verbalize understanding of return precautions. Patient strongly encouraged to follow-up with their PCP. All questions answered.  Patient ambulatory at time of discharge no acute distress.  Patient case discussed with Dr. Eudelia Bunchardama after shift change from Dr. Rush Landmarkegeler.  Patient case discussed with Dr. Eudelia Bunchardama, who agrees with strep culture, discharged with Tylenol OTC and PCP follow-up.  [employer] translation services were used during this visit.  Note: Portions of this report may have been transcribed  using voice recognition software. Every effort was made to ensure accuracy; however, inadvertent computerized transcription errors may still be present.   Final Clinical Impressions(s) / ED Diagnoses   Final diagnoses:  Sore throat    ED Discharge Orders    None       Bill SalinasMorelli, Alacia Rehmann A, PA-C 07/07/18 0139    Bill SalinasMorelli, Ardell Makarewicz A, PA-C 07/07/18 0159    Bill SalinasMorelli, Thierry Dobosz A, PA-C 07/07/18 0159    Tegeler, Canary Brimhristopher J, MD 07/07/18 [phone]

## 2018-07-06 NOTE — ED Notes (Signed)
PA explained tests results to pt. / family and plan of care .

## 2018-07-06 NOTE — ED Provider Notes (Signed)
Patient placed in Quick Look pathway, seen and evaluated   Chief Complaint: sore throat bodyache  HPI:   Pt complains of a sore throat and trouble swallowing.  Pt reports body aches for over a month  ROS: fever, abdominal pain  Physical Exam:   Gen: No distress  Neuro: Awake and Alert  Skin: Warm    Focused Exam: erythema throat Lungs clear Heart rrr   Initiation of care has begun. The patient has been counseled on the process, plan, and necessity for staying for the completion/evaluation, and the remainder of the medical screening examination   Osie CheeksSofia, Tonni Mansour K, PA-C 07/06/18 Lyn [id number]    Zavitz, Joshua, MD 07/11/18 1531

## 2018-07-06 NOTE — ED Triage Notes (Signed)
Pt endorses generalized bodyaches x 1 month with sore throat x 5 days, chills and fever. 100.2 oral temp in triage. Tachy.

## 2018-07-07 ENCOUNTER — Emergency Department (HOSPITAL_COMMUNITY): Payer: Self-pay

## 2018-07-07 MED ORDER — IOHEXOL 300 MG/ML  SOLN
75.0000 mL | Freq: Once | INTRAMUSCULAR | Status: AC | PRN
  Administered 2018-07-07: 75 mL via INTRAVENOUS

## 2018-07-07 NOTE — ED Notes (Signed)
Patient transported to CT 

## 2018-07-07 NOTE — Discharge Instructions (Addendum)
Please follow-up with your primary care provider regarding your visit today.  If you do not have a primary care provider please see the resources attached ability to establish one. Please return to the emergency department for any new or worsening symptoms or if your symptoms do not improve. Please continue to use over-the-counter Tylenol for your pain as directed on the packaging. Your throat culture has been sent off to see if it grows bacteria, if it grows bacteria that require antibiotics he will be contacted by [facility]. Please be sure to drink plenty of water to avoid dehydration.  Google translation of English to Spanish, may contain errors: 1. Haga un seguimiento con su proveedor de atencin primaria con respecto a su visita de hoy. Si no tiene un proveedor de atencin primaria, consulte la capacidad adjunta de recursos para Clinical research associateestablecer uno. 2. Regrese al departamento de emergencias por cualquier sntoma nuevo o que empeore o si sus sntomas no mejoran. 3. Contine usando Tylenol de venta libre para Chief Technology Officerel dolor segn lo indicado en el paquete. 4. Su cultivo de garganta ha sido enviado para ver si crece bacterias, si crece bacterias que requieren antibiticos, [facility] se pondr en contacto con l. 5. Asegrese de beber mucha agua para evitar la deshidratacin.   SOLICITE AYUDA SI: Tiene bultos grandes y dolorosos al tacto en el cuello. Tiene una erupcin cutnea. Cuando tose elimina una expectoracin verde, amarillo amarronado o con [place]ang[place].  SOLICITE AYUDA DE INMEDIATO SI: Presenta rigidez en el cuello. Babea o no puede tragar lquidos. Vomita o no puede retener los American International Groupmedicamentos ni los lquidos. Siente un dolor intenso que no se alivia con medicamentos. Tiene problemas para Industrial/product designerrespirar (y no debido a la nariz tapada).  RESOURCE GUIDE  Chronic Pain Problems: Contact [facility]  [phone] Patients need to be referred by their primary care doctor.  Insufficient Money for  Medicine: Contact United Way:  call "211" or [facility] [phone].  No Primary Care Doctor: Call [facility] Connect  [phone] - can help you locate a primary care doctor that  accepts your insurance, provides certain services, etc. Physician Referral Service- [phone]  Agencies that provide inexpensive medical care: [facility]  [phone] [facility]  [phone] [facility]  [phone] [facility]  [phone] [facility]  [phone] [facility]  [phone]  Medicaid-accepting [place] Providers: [facility]- [address] [place] [address], [address]  [phone], Mon-Fri 9am-7pm, Sat 9am-1pm [facility]- [address], [address] [place]  [phone] [facility]- [address], [address] [place]  [phone] [facility]- [address] [place] [address]  [phone] Renaye RakersVeita Bland- [address], [address], [phone]  Only accepts [place] patients after they have their name  applied to their card  Self Pay (no insurance) in [place] [facility] [place]: Sickle Cell Patients: Dr Willey BladeEric Dean, [facility]  [address], [phone] [facility]- [address]  [phone]       Redge Gainer-     [facility] [place]- [address], [address]       -     [facility]- see information above (Speak to CitigroupPam H if you do not have insurance)       -  [facility]- [address], [phone]       -  [facility] [phone],  [phone]       -  [facility]- [address] [place] [address], [phone]       -  Dr Julio Sickssei-Bonsu-  [address], [address], [place], [phone]       -  [facility]- [address], [phone]       -  [facility] [place]- [address] [place] [address], [phone], also [address], [phone]       -    [facility]- [address], [phone], 1st & 3rd  Saturday   every month, 10am-1pm  1) Find a Doctor and Pay Out of Pocket Although you won't have to find out who is covered by your insurance plan, it is a good idea to ask around and get recommendations. You will then need to call the office and see if the doctor you have chosen will accept you as a new patient and what types of options they offer for patients who are self-pay. Some doctors offer discounts or will set up payment plans for their patients who do not have insurance, but you will need to ask so you aren't surprised when you get to your appointment.  2) Contact Your Local Health Department Not all health departments have doctors that can see patients for sick visits, but many do, so it is worth a call to see if yours does. If you don't know where your local health department is, you can check in your phone book. The CDC also has a tool to help you locate your state's health department, and many state websites also have listings of all of their local health departments.  3) Find a Walk-in Clinic If your illness is not likely to be very severe or complicated, you may want to try a walk in clinic. These are popping up all over the country in pharmacies, drugstores, and shopping centers. They're usually staffed by nurse practitioners or physician assistants that have been trained to treat common illnesses and complaints. They're usually fairly quick and inexpensive. However, if you have serious medical issues or chronic medical problems, these are probably not your best option  STD Testing [facility] Department of [facility] [place], STD Clinic, [address], [place], phone [phone] or [phone].  Monday - Friday, call for an appointment. [facility] Department of [facility] [place], STD Clinic, [place], [place], phone [phone] or [phone].  Monday - Friday, call for an appointment.  Abuse/Neglect: [facility] Child Abuse Hotline 269-667-0992(336)  805-745-2434 [facility] Child Abuse Hotline [phone] (After Hours)  Emergency Shelter:  [facility] [phone]) [phone]  Maternity Homes: [facility] [phone] Rebeca AlertFlorence [facility] Services [phone]  MRSA Hotline #:   670-082-7515(715) 578-8798  [facility] Resources  [facility] [place]  United Way [facility]. [address]                 [address]         [address]  [address]                                               [place]  [facility] Phone:  [phone]                                  Phone:  [phone]                   Phone:  [phone]  [facility], [place]- [phone]       -     [facility] in [place], [address]                                  [phone], [place] [phone] or [phone] (After Hours)   [place]  Substance Abuse Resources: Alcohol and Drug Services  [phone] [place] [phone] [facility] [place] [phone] [phone] Residential & Outpatient Substance Abuse Program  [phone]  Psychological Services: [place]  [phone] [place], [place]. [address], [place], [place]: [phone] or [phone], PicCapture.uy  Dental Assistance  If unable to pay or uninsured, contact:  [facility] or [facility]. to become qualified for the adult dental clinic.  Patients with Medicaid: [facility] [phone] [address], [place] [address], [phone]  If unable to pay, or uninsured, contact [facility] [phone]) or [place] [phone] in  [place], [place] in [facility]) to become qualified for the adult dental clinic   Other [place]- [place], [place], [place], [address], [place], [place], 2nd and 4th Thursday of the month at 6:30am.  10 clients each day by appointment, can sometimes see walk-in patients if someone does not show for an appointment. [facility]- [address] [place], [place], [address], [place], [place], [place], [address], [place] Department- [phone] [facility] [phone]

## 2018-07-09 LAB — CULTURE, GROUP A STREP (THRC)

## 2018-07-20 ENCOUNTER — Ambulatory Visit: Payer: Self-pay | Admitting: Internal Medicine

## 2018-07-20 ENCOUNTER — Encounter: Payer: Self-pay | Admitting: Internal Medicine

## 2018-07-20 VITALS — BP 102/62 | HR 70 | Resp 12 | Ht 59.5 in | Wt 129.0 lb

## 2018-07-20 DIAGNOSIS — K219 Gastro-esophageal reflux disease without esophagitis: Secondary | ICD-10-CM

## 2018-07-20 DIAGNOSIS — M6281 Muscle weakness (generalized): Secondary | ICD-10-CM

## 2018-07-20 DIAGNOSIS — M791 Myalgia, unspecified site: Secondary | ICD-10-CM

## 2018-07-20 MED ORDER — FAMOTIDINE 20 MG PO TABS: ORAL_TABLET | ORAL | 2 refills | Status: DC

## 2018-07-20 NOTE — Progress Notes (Signed)
Subjective:    Patient ID: Audrey [name], female    DOB: 1981/07/09, 37 y.o.   MRN: [id number]  HPI   Here to establish  Was seen in ED 2 weeks ago with body aches and sore throat.  Started with pain in body for 2 months.  Feels like her muscles were and are sore in her thighs and lower leg muscles.  Started in her arms, however. Also in her upper arms and feels numbness into her hands.  Actually feels like the numbness starts on posterior shoulder with pain and extends down radial arms bilaterally. Was taking Ibuprofen 400 mg 3 times daily.  Sometimes without meals or food.  Was taking this for 2 months regularly. Subsequently developed pain in throat.  Noted problem swallowing foods and saliva--felt like she has balls in throat.  She would also note pain in her chest when swallowing food.   She states the throat discomfort is better.   States she was given medication in an IV in ED and for 3 days afterward, she felt better.  Received Dexamethasone 10 mg IV and Toradol. Also underwent CT of neck and soft tissues which was really unremarkable other than some reactive cervical lymphadenopathy and bilateral aplastic submandibular glands. She states her pains in her legs and arms are back.  She has to lie down at times as she cannot handle the pain. Her throat no longer hurts, though she still feels like she has "little balls in her throat"  Like something is in the way when she swallows.    Difficulties sitting in low chair and getting back up.  Difficulties putting up her hair due to the arm discomfort.  Has noted hair loss, dry tender skin, possibly some swelling of lower legs at times.  Occasional constipation.   No travel in recent months.  Not a camping family.  She is not outdoors much.  No history of rash or tick bite.  Does not work outside the home.  No new exposures in past months. No known family history of inflammatory arthritis or connective tissue type  disease.  Current Meds  Medication Sig  . Cholecalciferol (VITAMIN D3) 2000 units TABS Take by mouth daily.  Marland Kitchen ibuprofen (ADVIL,MOTRIN) 200 MG tablet Take 200 mg by mouth every 6 (six) hours as needed.    No Known Allergies   History reviewed. No pertinent past medical history.   Past Surgical History:  Procedure Laterality Date  . CHOLECYSTECTOMY  2009   Laparoscopic  . DILATION AND CURETTAGE OF UTERUS  2007   following SAB    Social History   Socioeconomic History  . Marital status: Single    Spouse name: Not on file  . Number of children: 3  . Years of education: Not on file  . Highest education level: Not on file  Occupational History  . Occupation: Housewife  Social Needs  . Financial resource strain: Not on file  . Food insecurity:    Worry: Not on file    Inability: Not on file  . Transportation needs:    Medical: Not on file    Non-medical: Not on file  Tobacco Use  . Smoking status: Never Smoker  . Smokeless tobacco: Never Used  Substance and Sexual Activity  . Alcohol use: No  . Drug use: No  . Sexual activity: Not on file  Lifestyle  . Physical activity:    Days per week: Not on file    Minutes per session: Not on  file  . Stress: Not on file  Relationships  . Social connections:    Talks on phone: Not on file    Gets together: Not on file    Attends religious service: Not on file    Active member of club or organization: Not on file    Attends meetings of clubs or organizations: Not on file    Relationship status: Not on file  . Intimate partner violence:    Fear of current or ex partner: Not on file    Emotionally abused: Not on file    Physically abused: Not on file    Forced sexual activity: Not on file  Other Topics Concern  . Not on file  Social History Narrative   Lives with husband and 3 children kids.   Review of Systems     Objective:   Physical Exam  Mild discomfort HEENT: Face puffy, maybe a bit flushed.  PERRL, EOMI, TMs  pearly gray, throat without injection or exudate.  Tonsils not enlarged.   Neck:  Supple, No adenopathy, no thyromegaly Chest:  CTA CV:  RRR with normal S1 and S2, No S3, S4 or murmur.  Carotid, radial and DP/PT pulses normal and equal Abd:  S, NT, No HSM or mass, + BS MS:  Tender on palpation of proximal musculature of arms and legs.  No erythema, rash or swelling.  Mild difficulty in standing from low sitting position. No swelling or redness of joints.  Full ROM Neuro:  A & O x 3, CN  II-XII grossly intact.  Unable to obtain DTRs.  Difficulty relaxing limbs for exam.  Motor 4/5 throughout, but appears limited due to discomfort. Skin:  No rash.      Assessment & Plan:  1.  ? Primary muscular inflammatory process.  Appears to possibly be affecting proximal muscles more.  CK, Sed rate, ANA, TSH  2.  GERD:  Vs gastritis with NSAID intake for pain:  Famotidine 40 mg once daily at bedtime.  Return in 1 week for follow up

## 2018-07-20 NOTE — Progress Notes (Signed)
Patient ID: Audrey [name], female   DOB: 1981-03-03, 37 y.o.   MRN: [id number]  Met briefly to introduce self to patient and share that LCSW provides counseling services at the clinic. Reviewed her PHQ-9 with her and reflected that she had checked "several days" for several symptoms; patient reported that none of the symptoms were urgent.

## 2018-07-21 LAB — CK: CK TOTAL: 21 U/L — AB (ref 24–173)

## 2018-07-21 LAB — ANA: Anti Nuclear Antibody(ANA): POSITIVE — AB

## 2018-07-21 LAB — TSH: TSH: 1.46 u[IU]/mL (ref 0.450–4.500)

## 2018-07-21 LAB — SEDIMENTATION RATE: SED RATE: 39 mm/h — AB (ref 0–32)

## 2018-07-29 ENCOUNTER — Encounter: Payer: Self-pay | Admitting: Internal Medicine

## 2018-07-29 ENCOUNTER — Ambulatory Visit: Payer: Self-pay | Admitting: Internal Medicine

## 2018-07-29 VITALS — BP 120/68 | HR 86 | Temp 98.8°F | Resp 12 | Ht 59.5 in | Wt 128.0 lb

## 2018-07-29 DIAGNOSIS — M1990 Unspecified osteoarthritis, unspecified site: Secondary | ICD-10-CM

## 2018-07-29 DIAGNOSIS — M791 Myalgia, unspecified site: Secondary | ICD-10-CM

## 2018-07-29 DIAGNOSIS — R768 Other specified abnormal immunological findings in serum: Secondary | ICD-10-CM

## 2018-07-29 DIAGNOSIS — K219 Gastro-esophageal reflux disease without esophagitis: Secondary | ICD-10-CM

## 2018-07-29 MED ORDER — PREDNISONE 20 MG PO TABS: ORAL_TABLET | ORAL | 1 refills | Status: DC

## 2018-07-29 NOTE — Progress Notes (Signed)
   Subjective:    Patient ID: Audrey [name], female    DOB: July 14, 1981, 37 y.o.   MRN: [id number]  HPI   1.  GERD:  Continuing with the ibuprofen for muscle pain.  She did get the Famotidine immediately and taking 40 mg at bedtime.  She does not note any improvement of sense of food and acid coming up after eating.   Sleeps flat currently. She has only been on meds for a week. No difficulty swallowing.  2.  Muscle pain and weakness--Previous history seemed to place the problem more prominently in proximal muscle groups, especially her thighs. However, today, clarifies she has had pain in her hands with stiffness since the beginning.  The joint discomfort seems to wax and wane. Describes morning stiffness as well for about 2 hours.   Bilateral PIP and DIPs hurt, but MCPs do not.  Her wrists and knees as well as ankles and  toes hurt as well.   At times hurts to walk The joints appear swollen to her, but not red.    Labs from last week with low CK level at 21 Sed Rate mildly increased at 39 TSH normal at 1.460 ANA without reflex +   Current Meds  Medication Sig  . Cholecalciferol (VITAMIN D3) 2000 units TABS Take by mouth daily.  . famotidine (PEPCID) 20 MG tablet 2 tabs by mouth at bedtime  . ibuprofen (ADVIL,MOTRIN) 200 MG tablet Take 200 mg by mouth every 6 (six) hours as needed.   No Known Allergies        Review of Systems     Objective:   Physical Exam  Mild discomfort HEENT:  Face puffy, flushed Neck:  Supple, No adenopathy Chest:  CTA CV:  RRR without murmur or rub.  Radial and DP pulses normal and equal MS:  Mildly decreased ROM of wrists with possibly mild synovial thickening.  No erythema.  MCPs of hands and MTPs of feet without swelling or erythema.  Possibly mild synovial thickening of PIPs of fingers/toes.  Mild erythema. Tender over PIPs diffusely (fingers) Ankle NT without palpable effusion or erythema     Assessment & Plan:  1.  Inflammatory  joint disease with + ANA. Cost prohibition with ENA panel.  Will start with ANA titre and fluorescence, as well as DS DNA and  RF Prednisone 20 mg daily. Likely referral to Rheumatology at [facility] Follow up in 1 week.   2.  GERD:  Continue with Famotidine and Stop NSAIDS

## 2018-07-30 LAB — ANA: ANA: POSITIVE — AB

## 2018-07-30 LAB — ANTI-DNA ANTIBODY, DOUBLE-STRANDED: dsDNA Ab: 13 IU/mL — ABNORMAL HIGH (ref 0–9)

## 2018-07-30 LAB — RHEUMATOID FACTOR: Rhuematoid fact SerPl-aCnc: <10 IU/mL (ref 0.0–13.9)

## 2018-08-06 ENCOUNTER — Other Ambulatory Visit: Payer: Self-pay

## 2018-08-30 ENCOUNTER — Other Ambulatory Visit: Payer: Self-pay

## 2018-08-30 ENCOUNTER — Telehealth: Payer: Self-pay | Admitting: Internal Medicine

## 2018-08-30 MED ORDER — PREDNISONE 20 MG PO TABS: ORAL_TABLET | ORAL | 1 refills | Status: DC

## 2018-08-30 NOTE — Telephone Encounter (Signed)
Rx  sent  to  [facility]

## 2018-08-30 NOTE — Telephone Encounter (Signed)
Patient needs refill of Rx prednisone (DELTASONE) 20 mg tablet

## 2018-10-07 ENCOUNTER — Ambulatory Visit: Payer: Self-pay | Admitting: Internal Medicine

## 2018-10-07 ENCOUNTER — Encounter: Payer: Self-pay | Admitting: Internal Medicine

## 2018-10-07 VITALS — BP 124/82 | HR 88 | Resp 12 | Ht 59.5 in | Wt 128.0 lb

## 2018-10-07 DIAGNOSIS — R739 Hyperglycemia, unspecified: Secondary | ICD-10-CM

## 2018-10-07 DIAGNOSIS — M1990 Unspecified osteoarthritis, unspecified site: Secondary | ICD-10-CM | POA: Insufficient documentation

## 2018-10-07 MED ORDER — PREDNISONE 20 MG PO TABS: ORAL_TABLET | ORAL | 1 refills | Status: DC

## 2018-10-07 NOTE — Progress Notes (Signed)
Subjective:    Patient ID: Audrey [name], female    DOB: 11/28/1980, 37 y.o.   MRN: [id number]  HPI   Here as she is having worsening inflammation of hands and feet for past week.  Has been taking 20 mg prednisone since 07/28/2018, awaiting referral for Rheumatology.  She states the prednisone has helped her pain and swelling significantly.  Had a bit of discomfort in her feet prior to a week ago, but 1 week ago, gradually developed increasing pain and swelling of 2nd and 3rd MCPs of bilateral hands, her knees bilaterally, and at MTPs diffusely of toes bilaterally. No definite fever, but has had sense of being hot and cold.  Mild throat pain.  No cough.  No dyspnea.  No dysuria.  No gross hematuria.  No rash.  She may get redness over maxillary and glabellar areas of face at times. No one is ill at home. What appears to be an inflammatory process of soft tissue of hands and feet and discomfort in proximal muscle groups.   Was referred [facility] in September, but she states she has not heard anything. Initial presentation was that of sore throat and muscle aches.  Strep testing negative in ED. Had normal WBC and mild normocytic anemia with hemoglobin 11.4 . Established here subsequently end of August.  Sed Rate mildly elevated then at 39 and CK actually low at 21.  Was complaining more so of proximal muscle pain and weakness. Returned in September with more complaints of hands and feet, soft tissue and joints.  ANA returned elevated.  Attempted to order titre and fluorescence, but the same qualitative ANA performed instead. DS DNA elevated at 13 (normal to 9) RF negative. UA with small Hgb  Looking back in chart, noted to have a + RPR with titre 1:1 in 2012, but T pallidum Ab negative, so false + RPR.  Patient does not remember this.  She did give birth at that time.  Current Meds  Medication Sig  . Cholecalciferol (VITAMIN D3) 2000 units TABS Take by mouth daily.  .  famotidine (PEPCID) 20 MG tablet 2 tabs by mouth at bedtime  . Multiple Vitamin (MULTIVITAMIN) tablet Take 1 tablet by mouth daily.  . predniSONE (DELTASONE) 20 MG tablet 1 tab by mouth daily with breakfast    No Known Allergies  Review of Systems     Objective:   Physical Exam NAD HEENT: PERRL, EOMI, conjunctivae without injection. TMs pearly gray, throat without injection or exudate.  Possibly a subtle malar erythema. Neck:  Supple No adenopathy Chest:  CTA CV:   MS:  2nd and 3rd MCPs of right hand, 2nd, 3rd, and 5th MCPs of left with redness, effusion/synovial thickening and tenderness.  Decreased flexion with pain.  Tender with minimal synovial thickening and decreased ROM bilateral wrists.  No erythema at wrists. Elbows tender over olecranon bilaterally, but no erythema, limitation of ROM or swelling. Knees with tenderness over medial and lateral joint lines.  No erythema or effusion appreciated. Tender with decreased ROM of bilateral ankles, but no effusion or erythema. Swelling and erythema diffusely across MTPs bilaterally with feet. Walks gingerly due to pain in feet.  Skin:  No rash other than mild erythema of malar area.       Assessment & Plan:  Inflammatory arthritis with flare on 20 mg prednisone.  This is first time MCPs/MTPs involved. Increase to 30 mg of prednisone and check CBC, CMP.  Menstruating, so hold on repeat  UA. Somehow did not get referral to Rheumatology completed. Arranging with [facility] now. Cost of ENA panel prohibitive.  Hopefully will be able to get at [facility] with financial assistance. With + DS DNA leaning toward SLE as diagnosis. Follow up in 1 1/2 weeks and will see if can decrease prednisone.  To call for new symptoms.

## 2018-10-07 NOTE — Addendum Note (Signed)
Addended by: Marcene DuosMULBERRY, Dandy Lazaro M on: 10/07/2018 01:21 PM   Modules accepted: Orders

## 2018-10-08 LAB — CBC WITH DIFFERENTIAL/PLATELET
BASOS ABS: 0 10*3/uL (ref 0.0–0.2)
Basos: 0 %
EOS (ABSOLUTE): 0 10*3/uL (ref 0.0–0.4)
EOS: 0 %
HEMATOCRIT: 36.7 % (ref 34.0–46.6)
Hemoglobin: 12 g/dL (ref 11.1–15.9)
IMMATURE GRANULOCYTES: 1 %
Immature Grans (Abs): 0 10*3/uL (ref 0.0–0.1)
Lymphocytes Absolute: 0.5 10*3/uL — ABNORMAL LOW (ref 0.7–3.1)
Lymphs: 11 %
MCH: 26.7 pg (ref 26.6–33.0)
MCHC: 32.7 g/dL (ref 31.5–35.7)
MCV: 82 fL (ref 79–97)
MONOCYTES: 3 %
MONOS ABS: 0.2 10*3/uL (ref 0.1–0.9)
Neutrophils Absolute: 3.9 10*3/uL (ref 1.4–7.0)
Neutrophils: 85 %
Platelets: 245 10*3/uL (ref 150–450)
RBC: 4.49 x10E6/uL (ref 3.77–5.28)
RDW: 13.7 % (ref 12.3–15.4)
WBC: 4.6 10*3/uL (ref 3.4–10.8)

## 2018-10-08 LAB — COMPREHENSIVE METABOLIC PANEL
ALK PHOS: 72 IU/L (ref 39–117)
ALT: 19 IU/L (ref 0–32)
AST: 27 IU/L (ref 0–40)
Albumin/Globulin Ratio: 1 — ABNORMAL LOW (ref 1.2–2.2)
Albumin: 3.7 g/dL (ref 3.5–5.5)
BUN/Creatinine Ratio: 18 (ref 9–23)
BUN: 9 mg/dL (ref 6–20)
Bilirubin Total: 0.3 mg/dL (ref 0.0–1.2)
CALCIUM: 8.8 mg/dL (ref 8.7–10.2)
CO2: 22 mmol/L (ref 20–29)
CREATININE: 0.49 mg/dL — AB (ref 0.57–1.00)
Chloride: 99 mmol/L (ref 96–106)
GFR calc Af Amer: 144 mL/min/{1.73_m2} (ref >59)
GFR, EST NON AFRICAN AMERICAN: 125 mL/min/{1.73_m2} (ref >59)
GLOBULIN, TOTAL: 3.6 g/dL (ref 1.5–4.5)
GLUCOSE: 120 mg/dL — AB (ref 65–99)
Potassium: 4.2 mmol/L (ref 3.5–5.2)
SODIUM: 136 mmol/L (ref 134–144)
Total Protein: 7.3 g/dL (ref 6.0–8.5)

## 2018-10-14 LAB — SPECIMEN STATUS REPORT

## 2018-10-14 LAB — HGB A1C W/O EAG: Hgb A1c MFr Bld: 5.7 % — ABNORMAL HIGH (ref 4.8–5.6)

## 2018-10-19 ENCOUNTER — Ambulatory Visit (INDEPENDENT_AMBULATORY_CARE_PROVIDER_SITE_OTHER): Payer: Self-pay | Admitting: Internal Medicine

## 2018-10-19 ENCOUNTER — Encounter: Payer: Self-pay | Admitting: Internal Medicine

## 2018-10-19 VITALS — BP 122/72 | HR 78 | Resp 12 | Ht 59.5 in | Wt 125.0 lb

## 2018-10-19 DIAGNOSIS — R768 Other specified abnormal immunological findings in serum: Secondary | ICD-10-CM

## 2018-10-19 DIAGNOSIS — Z23 Encounter for immunization: Secondary | ICD-10-CM

## 2018-10-19 DIAGNOSIS — M1990 Unspecified osteoarthritis, unspecified site: Secondary | ICD-10-CM

## 2018-10-19 DIAGNOSIS — M791 Myalgia, unspecified site: Secondary | ICD-10-CM

## 2018-10-19 MED ORDER — HYDROXYCHLOROQUINE SULFATE 200 MG PO TABS: ORAL_TABLET | ORAL | 3 refills | Status: AC

## 2018-10-19 MED ORDER — PREDNISONE 20 MG PO TABS: ORAL_TABLET | ORAL | 2 refills | Status: DC

## 2018-10-19 NOTE — Progress Notes (Signed)
   Subjective:    Patient ID: Audrey [name], female    DOB: 12/27/1980, 37 y.o.   MRN: [id number]  HPI   [facility] and working diagnosis of SLE with labs and physical findings/history. Was initiated on Hydroxychloroquine 400 mg daily, [facility], so she did not pick up. She is taking prednisone 30 mg daily Is having spreading rashes now on arms and chest and continues with joint discomfort.  Her joints feel a bit better than when seen by Rheum.   She is to start weaning her prednisone once she has been on Plaquenil for 1 week She is aware she will need every 6 months eye exam and [facility] is trying to get her set up with those.  Found information that she was to call the number for the clinic to get set up with that examination herself.  Current Meds  Medication Sig  . Cholecalciferol (VITAMIN D3) 2000 units TABS Take by mouth daily.  . famotidine (PEPCID) 20 MG tablet 2 tabs by mouth at bedtime  . ibuprofen (ADVIL,MOTRIN) 200 MG tablet Take 200 mg by mouth every 6 (six) hours as needed.  . Multiple Vitamin (MULTIVITAMIN) tablet Take 1 tablet by mouth daily.  . predniSONE (DELTASONE) 20 MG tablet 11/2 tab by mouth daily with breakfast  . [DISCONTINUED] predniSONE (DELTASONE) 20 MG tablet 1 tab by mouth daily with breakfast    Review of Systems     Objective:   Physical Exam  NAD HEENT: PERRL, EOMI, malar type redness Neck:  Supple, No adenopathy Chest:  CTA CV:  RRR without murmur or rub.  Radial pulses normal and equal Abd:  S, NT, No HSM or mass, + BS Skin:  Scattered raised red macules all over chest, some coalescent and on arms MS:  MCPs of 2nd and 3rd fingers with erythema and swelling and stiffness.       Assessment & Plan:  1.  Inflammatory arthritis, muscle pain, likely due to SLE with + ANA and now with rash likely associated.  Able to obtain hydroxychloroquine at an affordable price through [facility] at [employer].  Sent.   Discussed may not be able to wean prednisone so quickly after starting plaquenil.   Will have her follow up in a couple weeks to see how she is doing first.    Discussed need to ask questions at her visits if she is not understanding the plan. Gave phone number to [facility].    2.  HM:  Influenza vaccine.

## 2018-10-19 NOTE — Patient Instructions (Signed)
[id number]--hablar para examinacion de ojos --muy importante

## 2018-11-04 ENCOUNTER — Ambulatory Visit: Payer: Self-pay | Admitting: Internal Medicine

## 2018-11-04 ENCOUNTER — Encounter: Payer: Self-pay | Admitting: Internal Medicine

## 2018-11-04 VITALS — BP 122/70 | HR 76 | Resp 12 | Ht 59.5 in | Wt 122.0 lb

## 2018-11-04 DIAGNOSIS — M328 Other forms of systemic lupus erythematosus: Secondary | ICD-10-CM

## 2018-11-04 DIAGNOSIS — M1990 Unspecified osteoarthritis, unspecified site: Secondary | ICD-10-CM

## 2018-11-04 MED ORDER — PREDNISONE 10 MG PO TABS: ORAL_TABLET | ORAL | 1 refills | Status: DC

## 2018-11-04 NOTE — Progress Notes (Signed)
   Subjective:   Patient ID: Audrey [name], female    DOB: 05/23/1981, 37 y.o.   MRN: [id number]  HPI  Symmetric inflammatory arthritis of multiple joints, including fingers, toes, wrists, elbows, shoulders, ankles and knees.  She states her joint pain is better. The rash for which she was seen on 11/26 has faded on upper arms, face and chest, but she is now showing a rash on her dorsal and palmar fingers and onto distal hands overlying both dorsal and palmar MCP areas.  She states the skin lesions are not painful or pruritic.    Continues on 30 mg of prednisone and did get started on Hydroxycholoroquine on 10/19/2018  Discussed her A1C shows she is in the early prediabetic range with blood sugars. She is hungry much of the time, but has lost weight.   [facility]2017. [facility] exam on [facility]/02/2018 [facility].  She is eager to start the wean of her prednisone.  Would like to go down at least to 25 mg daily.   Current Meds  Medication Sig  . Cholecalciferol (VITAMIN D3) 2000 units TABS Take by mouth daily.  . famotidine (PEPCID) 20 MG tablet 2 tabs by mouth at bedtime  . hydroxychloroquine (PLAQUENIL) 200 MG tablet 2 tabs by mouth once daily  . Multiple Vitamin (MULTIVITAMIN) tablet Take 1 tablet by mouth daily.  . predniSONE (DELTASONE) 20 MG tablet 11/2 tab by mouth daily with breakfast   No Known Allergies    Review of Systems    Objective:  Physical Exam No definite synovial thickening or effusions of joints of fingers, wrists, elbows, ankles. New violaceous erythema overlying dorsal hands starting over MCPs of all digits, extending over entire length of dorsal fingers.  Patchy areas of erythema on palmar surfaces of hand and fingers.  Fingers with somewhat diffuse swelling. Also with patchy violaceous erythema of upper lateral arms. Lungs:  CTA CV:  RRR without murmur or rub.       Assessment & Plan:  SLE with inflammatory joint and  skin involvement:  Concerned she will not tolerate a quick wean of prednisone.  She is adamant she would like to start a taper. Went over written instructions to gradually taper by 5 mg every 7 days.   To start wean in the morning down to 25 mg daily. Has follow up with Rheumatology in January. To call if worsening symptoms.

## 2018-11-04 NOTE — Patient Instructions (Signed)
En la manana, baja la prednisone a 25 mg (una tableta de 20 mg y media tableta de 10 mg al [employer]ismo tiempo)  todos los dias.  En siete dias, baja la prednisone a 20 mg (solamente una tableta de 20 mg) todos los dias.  En 2 semanas, baja la prednisone a 15 mg (1 1/2 tabletas de 10 mg) todos los dias  En 3 semanas, baja la prednisone a 10 mg (1 tableta de 10 mg or media tableta de 20 mg, la cual que tiene mas) todos low dias.   En 4 semanas, baja la prednisone a 5 mg (1/2 tableta de 10 mg ) todos los dias.  En 5 semanas, tome 5 mg (1/2 tableta de 10 mg ) cada otro dia.  Deja de usar prednisone despues 5 semanas  llame a la clinica si hay problemas o preguntas

## 2018-11-22 ENCOUNTER — Telehealth: Payer: Self-pay

## 2018-11-22 NOTE — Telephone Encounter (Signed)
Patient called with 2 week updated. Patient states the swelling and inflammation in hands have gone down, but now she has leg pain, dry mouth and bad breath. Patient states she has follow up with rheumatology on 12/16/18. Per Dr. Delrae AlfredMulberry make sure patient keeps appointment with rheumatology for the 23rd. Patient verbalized understanding

## 2018-12-01 ENCOUNTER — Inpatient Hospital Stay (HOSPITAL_COMMUNITY)
Admission: EM | Admit: 2018-12-01 | Discharge: 2018-12-04 | DRG: 546 | Disposition: A | Discharge status: Home / Self Care | Payer: Self-pay | Attending: Internal Medicine | Admitting: Internal Medicine

## 2018-12-01 ENCOUNTER — Emergency Department (HOSPITAL_COMMUNITY): Payer: Self-pay

## 2018-12-01 ENCOUNTER — Encounter (HOSPITAL_COMMUNITY): Payer: Self-pay

## 2018-12-01 DIAGNOSIS — R109 Unspecified abdominal pain: Secondary | ICD-10-CM

## 2018-12-01 DIAGNOSIS — N83201 Unspecified ovarian cyst, right side: Secondary | ICD-10-CM | POA: Diagnosis present

## 2018-12-01 DIAGNOSIS — Z9049 Acquired absence of other specified parts of digestive tract: Secondary | ICD-10-CM

## 2018-12-01 DIAGNOSIS — D649 Anemia, unspecified: Secondary | ICD-10-CM

## 2018-12-01 DIAGNOSIS — R000 Tachycardia, unspecified: Secondary | ICD-10-CM

## 2018-12-01 DIAGNOSIS — Z7952 Long term (current) use of systemic steroids: Secondary | ICD-10-CM

## 2018-12-01 DIAGNOSIS — Z975 Presence of (intrauterine) contraceptive device: Secondary | ICD-10-CM

## 2018-12-01 DIAGNOSIS — M329 Systemic lupus erythematosus, unspecified: Principal | ICD-10-CM | POA: Diagnosis present

## 2018-12-01 DIAGNOSIS — Z800 Family history of malignant neoplasm of digestive organs: Secondary | ICD-10-CM

## 2018-12-01 DIAGNOSIS — R102 Pelvic and perineal pain: Secondary | ICD-10-CM

## 2018-12-01 DIAGNOSIS — D638 Anemia in other chronic diseases classified elsewhere: Secondary | ICD-10-CM | POA: Diagnosis present

## 2018-12-01 DIAGNOSIS — N760 Acute vaginitis: Secondary | ICD-10-CM | POA: Diagnosis present

## 2018-12-01 DIAGNOSIS — Z79899 Other long term (current) drug therapy: Secondary | ICD-10-CM

## 2018-12-01 DIAGNOSIS — Z791 Long term (current) use of non-steroidal anti-inflammatories (NSAID): Secondary | ICD-10-CM

## 2018-12-01 DIAGNOSIS — D591 Other autoimmune hemolytic anemias: Secondary | ICD-10-CM | POA: Diagnosis present

## 2018-12-01 DIAGNOSIS — Z833 Family history of diabetes mellitus: Secondary | ICD-10-CM

## 2018-12-01 HISTORY — DX: Systemic lupus erythematosus, unspecified: M32.9

## 2018-12-01 HISTORY — DX: Anemia, unspecified: D64.9

## 2018-12-01 HISTORY — DX: Reserved for concepts with insufficient information to code with codable children: IMO0002

## 2018-12-01 LAB — CBC
HCT: 30 % — ABNORMAL LOW (ref 36.0–46.0)
Hemoglobin: 9.6 g/dL — ABNORMAL LOW (ref 12.0–15.0)
MCH: 26.4 pg (ref 26.0–34.0)
MCHC: 32 g/dL (ref 30.0–36.0)
MCV: 82.6 fL (ref 80.0–100.0)
Platelets: 240 10*3/uL (ref 150–400)
RBC: 3.63 MIL/uL — ABNORMAL LOW (ref 3.87–5.11)
RDW: 13.8 % (ref 11.5–15.5)
WBC: 4.6 10*3/uL (ref 4.0–10.5)
nRBC: 0 % (ref 0.0–0.2)

## 2018-12-01 LAB — URINALYSIS, ROUTINE W REFLEX MICROSCOPIC
Bilirubin Urine: NEGATIVE
Glucose, UA: NEGATIVE mg/dL
Hgb urine dipstick: NEGATIVE
Ketones, ur: NEGATIVE mg/dL
Nitrite: NEGATIVE
Protein, ur: NEGATIVE mg/dL
Specific Gravity, Urine: 1.013 (ref 1.005–1.030)
pH: 6 (ref 5.0–8.0)

## 2018-12-01 LAB — BASIC METABOLIC PANEL
Anion gap: 9 (ref 5–15)
BUN: 9 mg/dL (ref 6–20)
CO2: 24 mmol/L (ref 22–32)
Calcium: 8.4 mg/dL — ABNORMAL LOW (ref 8.9–10.3)
Chloride: 99 mmol/L (ref 98–111)
Creatinine, Ser: 0.65 mg/dL (ref 0.44–1.00)
GFR calc non Af Amer: >60 mL/min (ref >60)
Glucose, Bld: 124 mg/dL — ABNORMAL HIGH (ref 70–99)
Potassium: 4.3 mmol/L (ref 3.5–5.1)
Sodium: 132 mmol/L — ABNORMAL LOW (ref 135–145)

## 2018-12-01 LAB — I-STAT BETA HCG BLOOD, ED (MC, WL, AP ONLY): HCG, QUANTITATIVE: 164.2 m[IU]/mL — AB (ref <5)

## 2018-12-01 LAB — I-STAT TROPONIN, ED: Troponin i, poc: 0.02 ng/mL (ref 0.00–0.08)

## 2018-12-01 MED ORDER — SODIUM CHLORIDE 0.9 % IV BOLUS
1000.0000 mL | Freq: Once | INTRAVENOUS | Status: AC
  Administered 2018-12-01: 1000 mL via INTRAVENOUS

## 2018-12-01 NOTE — ED Provider Notes (Signed)
[facility] Provider Note   CSN: [id number] Arrival date & time: 12/01/18  1921     History   Chief Complaint Chief Complaint  Patient presents with  . Weakness  . Chest Pain  . Dizziness    HPI Audrey [name] is a 38 y.o. female.  The history is provided by the patient. A language interpreter was used.  She has history of lupus and comes in because of weakness and abdominal pain.  She denies fever but has had some chills.  She has not had any sweats.  There has been some mild nausea but no vomiting.  There has been a cold cough which has been nonproductive.  There has been some pain in her chest.  Abdominal pain is across the lower abdomen.  Symptoms started about 2 days ago and are getting worse.  Last menses was December 27 and normal.  She has an IUD for contraception.  She denies any vaginal discharge.  There has been some slight vaginal spotting.  Past Medical History:  Diagnosis Date  . Lupus [facility](HCC)     Patient Active Problem List   Diagnosis Date Noted  . Inflammatory arthritis 10/07/2018  . VITAMIN D DEFICIENCY 05/29/2009  . ALLERGIC RHINITIS 03/30/2009  . CHOLECYSTECTOMY, HX OF 08/21/2008  . HELICOBACTER PYLORI INFECTION, HX OF 06/13/2008  . GERD 06/08/2008  . CONSTIPATION 06/08/2008    Past Surgical History:  Procedure Laterality Date  . CHOLECYSTECTOMY  2009   Laparoscopic  . DILATION AND CURETTAGE OF UTERUS  2007   following SAB     OB History   No obstetric history on file.      Home Medications    Prior to Admission medications   Medication Sig Start Date End Date Taking? Authorizing Provider  Cholecalciferol (VITAMIN D3) 2000 units TABS Take by mouth daily.    [provider]  famotidine (PEPCID) 20 MG tablet 2 tabs by mouth at bedtime 07/20/18   Julieanne MansonMulberry, Elizabeth, MD  hydroxychloroquine (PLAQUENIL) 200 MG tablet 2 tabs by mouth once daily 10/19/18   Julieanne MansonMulberry, Elizabeth, MD  ibuprofen  (ADVIL,MOTRIN) 200 MG tablet Take 200 mg by mouth every 6 (six) hours as needed.    [provider]  Multiple Vitamin (MULTIVITAMIN) tablet Take 1 tablet by mouth daily.    [provider]  predniSONE (DELTASONE) 10 MG tablet Taper prednisone weekly as ordered in your patient information sheets 11/04/18   Julieanne MansonMulberry, Elizabeth, MD  predniSONE (DELTASONE) 20 MG tablet 11/2 tab by mouth daily with breakfast 10/19/18   Julieanne MansonMulberry, Elizabeth, MD    Family History No family history on file.  Social History Social History   Tobacco Use  . Smoking status: Never Smoker  . Smokeless tobacco: Never Used  Substance Use Topics  . Alcohol use: No  . Drug use: No     Allergies   Patient has no known allergies.   Review of Systems Review of Systems  All other systems reviewed and are negative.    Physical Exam Updated Vital Signs BP 123/79 (BP Location: Right Wrist)   Pulse (!) 115   Temp 98.4 F (36.9 C) (Oral)   Resp 18   LMP 11/01/2018   SpO2 99%   Physical Exam Vitals signs and nursing note reviewed.    38 year old female, resting comfortably and in no acute distress. Vital signs are significant for rapid heart rate. Oxygen saturation is 99%, which is normal. Head is normocephalic and atraumatic. PERRLA,  EOMI. Oropharynx is clear. Neck is nontender and supple without adenopathy or JVD. Back is nontender and there is no CVA tenderness. Lungs are clear without rales, wheezes, or rhonchi. Chest is nontender. Heart has regular rate and rhythm without murmur. Abdomen is soft, flat, with moderate tenderness across the suprapubic area.  There is no rebound or guarding.  There are no masses or hepatosplenomegaly and peristalsis is hypoactive. Pelvic: Normal external female genitalia.  Cervix has bluish hue with moderate mucus and some very small amount of blood.  IUD string is visible.  On bimanual exam, fundus is about 6 weeks size and tender.  There is diffuse  adnexal fullness and tenderness. Extremities have no cyanosis or edema, full range of motion is present. Skin is warm and dry without rash. Neurologic: Mental status is normal, cranial nerves are intact, there are no motor or sensory deficits.  ED Treatments / Results  Labs (all labs ordered are listed, but only abnormal results are displayed) Labs Reviewed  WET PREP, GENITAL - Abnormal; Notable for the following components:      Result Value   Clue Cells Wet Prep HPF POC PRESENT (*)    WBC, Wet Prep HPF POC MANY (*)    All other components within normal limits  BASIC METABOLIC PANEL - Abnormal; Notable for the following components:   Sodium 132 (*)    Glucose, Bld 124 (*)    Calcium 8.4 (*)    All other components within normal limits  CBC - Abnormal; Notable for the following components:   RBC 3.63 (*)    Hemoglobin 9.6 (*)    HCT 30.0 (*)    All other components within normal limits  URINALYSIS, ROUTINE W REFLEX MICROSCOPIC - Abnormal; Notable for the following components:   Leukocytes, UA TRACE (*)    Bacteria, UA RARE (*)    All other components within normal limits  HEMOGLOBIN AND HEMATOCRIT, BLOOD - Abnormal; Notable for the following components:   Hemoglobin 8.2 (*)    HCT 25.3 (*)    All other components within normal limits  I-STAT BETA HCG BLOOD, ED ([facility], [facility], [facility] ONLY) - Abnormal; Notable for the following components:   I-stat hCG, quantitative 164.2 (*)    All other components within normal limits  HCG, QUANTITATIVE, PREGNANCY  RPR  HIV ANTIBODY (ROUTINE TESTING W REFLEX)  I-STAT TROPONIN, ED  POC OCCULT BLOOD, ED  POC OCCULT BLOOD, ED  TYPE AND SCREEN  ABO/RH  GC/CHLAMYDIA PROBE AMP ([place]) NOT AT [facility]    EKG EKG Interpretation  Date/Time:  Wednesday December 01 2018 20:00:44 EST Ventricular Rate:  114 PR Interval:  122 QRS Duration: 64 QT Interval:  306 QTC Calculation: 421 R Axis:   71 Text Interpretation:  Sinus tachycardia Possible Left  atrial enlargement Cannot rule out Anterior infarct , age undetermined Abnormal ECG No old tracing to compare Confirmed by Dione Booze ([id number]) on 12/01/2018 10:57:29 PM   EKG Interpretation  Date/Time:  Wednesday December 01 2018 23:28:43 EST Ventricular Rate:  112 PR Interval:  122 QRS Duration: 72 QT Interval:  316 QTC Calculation: 432 R Axis:   56 Text Interpretation:  Sinus tachycardia Probable left atrial enlargement Low voltage, precordial leads Borderline T abnormalities, anterior leads When compared with ECG of EARLIER SAME DATE No significant change was found Confirmed by Dione Booze ([id number]) on 12/01/2018 11:47:02 PM       Radiology Ct Abdomen Pelvis W Contrast  Result Date: 12/02/2018 CLINICAL DATA:  Dropping hemoglobin. EXAM: CT ABDOMEN AND PELVIS WITH CONTRAST TECHNIQUE: Multidetector CT imaging of the abdomen and pelvis was performed using the standard protocol following bolus administration of intravenous contrast. CONTRAST:  100mL OMNIPAQUE IOHEXOL 300 MG/ML  SOLN COMPARISON:  None. FINDINGS: Lower chest: Hepatobiliary: Top of the liver is not included. This is unlikely to be significant in this clinical presentation. Visualized portions of the liver appear normal. Previous cholecystectomy. Pancreas: Normal Spleen: Normal Adrenals/Urinary Tract: Adrenal glands are normal. Kidneys are normal. Bladder is normal. Stomach/Bowel: No abnormal bowel finding. No evidence of obstruction, inflammatory change or mass lesion. Previous appendectomy. No evidence of diverticulosis or diverticulitis. Vascular/Lymphatic: Normal Reproductive: Normal. IUD in place. No pelvic mass. Other: No free fluid or air. Musculoskeletal: Normal IMPRESSION: No acute or significant finding. No cause bleeding/dropping hemoglobin identified. Electronically Signed   By: Paulina FusiMark  Shogry M.D.   On: 12/02/2018 06:43   [place] Ob Less Than 14 Weeks With Ob Transvaginal  Result Date: 12/02/2018 CLINICAL DATA:  Initial evaluation  for acute pelvic pain, early pregnancy. EXAM: OBSTETRIC <14 WK US AND TRANSVAGINAL OB US TECHNIQUE: Both transabdominal and transvaginal ultrasound examinations were performed for complete evaluation of the gestation as well as the maternal uterus, adnexal regions, and pelvic cul-de-sac. Transvaginal technique was performed to assess early pregnancy. COMPARISON:  None. FINDINGS: Intrauterine gestational sac: Negative. Yolk sac:  Negative. Embryo:  Negative. Cardiac Activity: N/A Heart Rate: N/A  bpm Subchorionic hemorrhage:  None visualized. Maternal uterus/adnexae: IUD noted within the endometrial cavity. 7 mm hypoechoic cystic lesion with echogenic rim within the right ovary, most suggestive of a possible small corpus luteal cyst. This appears to be intra-ovarian in location. Right ovary otherwise unremarkable. Left ovary within normal limits. 7 mm hyperechoic lesion noted, indeterminate, but of doubtful significance in the acute setting. No free fluid within the pelvis. IMPRESSION: 1. Early pregnancy with no discrete IUP or adnexal mass identified. Finding is consistent with a pregnancy of unknown anatomic location. Differential considerations include IUP to early to visualize, recent SAB, or possibly occult ectopic pregnancy. Close clinical monitoring with serial beta HCGs and close interval follow-up ultrasound recommended as clinically warranted. 2. 7 mm hypoechoic cystic lesion with echogenic rim within the right ovary, favored to reflect a small corpus luteal cyst. Attention at follow-up recommended. 3. IUD in place within the endometrial cavity. Electronically Signed   By: Rise MuBenjamin  McClintock M.D.   On: 12/02/2018 00:49    Procedures Procedures  CRITICAL CARE Performed by: Dione Boozeavid Daxton Nydam Total critical care time: 60 minutes Critical care time was exclusive of separately billable procedures and treating other patients. Critical care was necessary to treat or prevent imminent or life-threatening  deterioration. Critical care was time spent personally by me on the following activities: development of treatment plan with patient and/or surrogate as well as nursing, discussions with consultants, evaluation of patient's response to treatment, examination of patient, obtaining history from patient or surrogate, ordering and performing treatments and interventions, ordering and review of laboratory studies, ordering and review of radiographic studies, pulse oximetry and re-evaluation of patient's condition.  Medications Ordered in ED Medications  sodium chloride 0.9 % bolus 1,000 mL (0 mLs Intravenous Stopped 12/02/18 0157)  iohexol (OMNIPAQUE) 300 MG/ML solution 100 mL (100 mLs Intravenous Contrast Given 12/02/18 0615)     Initial Impression / Assessment and Plan / ED Course  I have reviewed the triage vital signs and the nursing notes.  Pertinent labs & imaging results that were available during my care of  the patient were reviewed by me and considered in my medical decision making (see chart for details).  Multiple complaints that does sound like a influenza-like illness.  However, point-of-care hCG is positive at 164, and hemoglobin has dropped from 12.0 on 11/14-9.6 today.  This is very worrisome for ruptured ectopic pregnancy.  Ultrasound is ordered.  ECG was obtained and shows sinus tachycardia.  Old records are reviewed confirming she is followed in family medicine office for lupus.  11:45 PM Discussed with Dr. Elroy Channel, on-call for gynecology.  She is aware of possible ectopic pregnancy, will recontact after ultrasound report.  Main lab hCG has come back less than 1.  Ultrasound shows no intrauterine abnormalities seen, IUD in place.  Right ovarian cyst noted.  No free fluid.  Additional history obtained from patient, last menses was very heavy and lasted 9 days.  She also denies melena.  Does admit to some blood when she wipes if she is constipated.  Rectal exam was done and showed no stool  in the rectal vault.  Orthostatic vital signs showed no significant change in pulse or blood pressure.  Hemoglobin was rechecked and has fallen another 1.4 g to 8.2.  She was sent for CT scanning of abdomen and pelvis to look for evidence of occult blood loss with no findings to explain drop in hemoglobin.  Case is discussed with Dr. Lovenia Kim of internal medicine teaching service who agrees to admit the patient.  Final Clinical Impressions(s) / ED Diagnoses   Final diagnoses:  Pelvic pain  Symptomatic anemia    ED Discharge Orders    None       Dione Booze, MD 12/02/18 [phone]

## 2018-12-01 NOTE — ED Triage Notes (Signed)
Pt reports intermittent dizziness for 2 weeks, generalized weakness, nausea and headaches. Pt ill appearing in triage. Pt has hx of lupus.

## 2018-12-02 ENCOUNTER — Inpatient Hospital Stay (HOSPITAL_COMMUNITY): Payer: Self-pay

## 2018-12-02 ENCOUNTER — Emergency Department (HOSPITAL_COMMUNITY): Payer: Self-pay

## 2018-12-02 DIAGNOSIS — M329 Systemic lupus erythematosus, unspecified: Principal | ICD-10-CM | POA: Insufficient documentation

## 2018-12-02 DIAGNOSIS — Z7952 Long term (current) use of systemic steroids: Secondary | ICD-10-CM

## 2018-12-02 DIAGNOSIS — D500 Iron deficiency anemia secondary to blood loss (chronic): Secondary | ICD-10-CM

## 2018-12-02 DIAGNOSIS — D649 Anemia, unspecified: Secondary | ICD-10-CM

## 2018-12-02 DIAGNOSIS — R109 Unspecified abdominal pain: Secondary | ICD-10-CM

## 2018-12-02 DIAGNOSIS — R000 Tachycardia, unspecified: Secondary | ICD-10-CM

## 2018-12-02 DIAGNOSIS — Z975 Presence of (intrauterine) contraceptive device: Secondary | ICD-10-CM

## 2018-12-02 DIAGNOSIS — Z79899 Other long term (current) drug therapy: Secondary | ICD-10-CM

## 2018-12-02 LAB — RETICULOCYTES
Immature Retic Fract: 9.5 % (ref 2.3–15.9)
RBC.: 3.23 MIL/uL — ABNORMAL LOW (ref 3.87–5.11)
RETIC CT PCT: 1 % (ref 0.4–3.1)
Retic Count, Absolute: 32 10*3/uL (ref 19.0–186.0)

## 2018-12-02 LAB — HEPATIC FUNCTION PANEL
ALK PHOS: 35 U/L — AB (ref 38–126)
ALT: 33 U/L (ref 0–44)
AST: 48 U/L — ABNORMAL HIGH (ref 15–41)
Albumin: 2.4 g/dL — ABNORMAL LOW (ref 3.5–5.0)
Bilirubin, Direct: 0.1 mg/dL (ref 0.0–0.2)
Indirect Bilirubin: 0.4 mg/dL (ref 0.3–0.9)
TOTAL PROTEIN: 6.2 g/dL — AB (ref 6.5–8.1)
Total Bilirubin: 0.5 mg/dL (ref 0.3–1.2)

## 2018-12-02 LAB — GC/CHLAMYDIA PROBE AMP (~~LOC~~) NOT AT ARMC
Chlamydia: NEGATIVE
Neisseria Gonorrhea: NEGATIVE

## 2018-12-02 LAB — TYPE AND SCREEN
ABO/RH(D): O POS
Antibody Screen: NEGATIVE

## 2018-12-02 LAB — IRON AND TIBC
Iron: 63 ug/dL (ref 28–170)
SATURATION RATIOS: 38 % — AB (ref 10.4–31.8)
TIBC: 167 ug/dL — ABNORMAL LOW (ref 250–450)
UIBC: 104 ug/dL

## 2018-12-02 LAB — WET PREP, GENITAL
Sperm: NONE SEEN
Trich, Wet Prep: NONE SEEN
Yeast Wet Prep HPF POC: NONE SEEN

## 2018-12-02 LAB — HIV ANTIBODY (ROUTINE TESTING W REFLEX): HIV SCREEN 4TH GENERATION: NONREACTIVE

## 2018-12-02 LAB — BILIRUBIN, FRACTIONATED(TOT/DIR/INDIR)
Bilirubin, Direct: 0.2 mg/dL (ref 0.0–0.2)
Indirect Bilirubin: 0.5 mg/dL (ref 0.3–0.9)
Total Bilirubin: 0.7 mg/dL (ref 0.3–1.2)

## 2018-12-02 LAB — DIRECT ANTIGLOBULIN TEST (NOT AT ARMC)
DAT, IgG: POSITIVE
DAT, complement: NEGATIVE

## 2018-12-02 LAB — SAVE SMEAR (SSMR)

## 2018-12-02 LAB — LACTATE DEHYDROGENASE: LDH: 352 U/L — ABNORMAL HIGH (ref 98–192)

## 2018-12-02 LAB — ABO/RH: ABO/RH(D): O POS

## 2018-12-02 LAB — RPR: RPR Ser Ql: NONREACTIVE

## 2018-12-02 LAB — POC OCCULT BLOOD, ED: Fecal Occult Bld: NEGATIVE

## 2018-12-02 LAB — HCG, QUANTITATIVE, PREGNANCY: hCG, Beta Chain, Quant, S: <1 m[IU]/mL (ref <5)

## 2018-12-02 LAB — CK: Total CK: 48 U/L (ref 38–234)

## 2018-12-02 LAB — SEDIMENTATION RATE: Sed Rate: 58 mm/hr — ABNORMAL HIGH (ref 0–22)

## 2018-12-02 LAB — HEMOGLOBIN AND HEMATOCRIT, BLOOD
HCT: 25.3 % — ABNORMAL LOW (ref 36.0–46.0)
Hemoglobin: 8.2 g/dL — ABNORMAL LOW (ref 12.0–15.0)

## 2018-12-02 LAB — FERRITIN: Ferritin: 327 ng/mL — ABNORMAL HIGH (ref 11–307)

## 2018-12-02 MED ORDER — ACETAMINOPHEN 325 MG PO TABS: 650.0000 mg | ORAL_TABLET | Freq: Four times a day (QID) | ORAL | Status: DC | PRN

## 2018-12-02 MED ORDER — SODIUM CHLORIDE 0.9 % IV SOLN
INTRAVENOUS | Status: AC
  Administered 2018-12-02: 15:00:00 via INTRAVENOUS
  Administered 2018-12-02: 1000 mL via INTRAVENOUS

## 2018-12-02 MED ORDER — PREDNISONE 20 MG PO TABS
10.0000 mg | ORAL_TABLET | Freq: Every day | ORAL | Status: DC
  Administered 2018-12-02: 10 mg via ORAL
  Filled 2018-12-02: qty 1

## 2018-12-02 MED ORDER — PREDNISONE 20 MG PO TABS: 40.0000 mg | ORAL_TABLET | Freq: Once | ORAL | Status: DC

## 2018-12-02 MED ORDER — PREDNISONE 20 MG PO TABS: 20.0000 mg | ORAL_TABLET | Freq: Every day | ORAL | Status: DC

## 2018-12-02 MED ORDER — PREDNISONE 20 MG PO TABS: 10.0000 mg | ORAL_TABLET | Freq: Once | ORAL | Status: DC

## 2018-12-02 MED ORDER — HYDROXYCHLOROQUINE SULFATE 200 MG PO TABS
400.0000 mg | ORAL_TABLET | Freq: Every day | ORAL | Status: DC
  Administered 2018-12-02 – 2018-12-04 (×3): 400 mg via ORAL
  Filled 2018-12-02 (×3): qty 2

## 2018-12-02 MED ORDER — IOHEXOL 300 MG/ML  SOLN
100.0000 mL | Freq: Once | INTRAMUSCULAR | Status: AC | PRN
  Administered 2018-12-02: 100 mL via INTRAVENOUS

## 2018-12-02 MED ORDER — SENNOSIDES-DOCUSATE SODIUM 8.6-50 MG PO TABS: 1.0000 | ORAL_TABLET | Freq: Every evening | ORAL | Status: DC | PRN

## 2018-12-02 MED ORDER — METRONIDAZOLE 500 MG PO TABS
500.0000 mg | ORAL_TABLET | Freq: Two times a day (BID) | ORAL | Status: DC
  Administered 2018-12-02 – 2018-12-04 (×4): 500 mg via ORAL
  Filled 2018-12-02 (×4): qty 1

## 2018-12-02 MED ORDER — ACETAMINOPHEN 650 MG RE SUPP: 650.0000 mg | Freq: Four times a day (QID) | RECTAL | Status: DC | PRN

## 2018-12-02 MED ORDER — PREDNISONE 50 MG PO TABS
50.0000 mg | ORAL_TABLET | Freq: Every day | ORAL | Status: DC
  Administered 2018-12-03 – 2018-12-04 (×2): 50 mg via ORAL
  Filled 2018-12-02 (×2): qty 1

## 2018-12-02 NOTE — Progress Notes (Signed)
Called for SCDs, none available at present but will bring when available.  Got pt drinks and crackers, she feels warn to touch, temp 99.2.

## 2018-12-02 NOTE — ED Notes (Signed)
Pt ambulated to bathroom with standby assist. No difficulty noted. Given OJ per request. Resting comfortably.

## 2018-12-02 NOTE — Progress Notes (Signed)
Pt admitted to 6N13 from ED with lupus, oriented to room and dept, called cardiac monitoring to set up, done and verifed with second person.  Rash noted bilateral hands and face, pt states back as well.

## 2018-12-02 NOTE — ED Notes (Signed)
Pt. reports significant increased dizziness when changing positions during orthostatic VS

## 2018-12-02 NOTE — H&P (Addendum)
Date: 12/02/2018               Patient Name:  Audrey [name] MRN: [id number]  DOB: 04-14-1981 Age / Sex: 38 y.o., female   PCP: Audrey Hook, MD         Medical Service: Internal Medicine Teaching Service         Attending Physician: Dr. Annia Belt, MD    First Contact: Dr. Annie [name] Pager: [phone]  Second Contact: Dr. Tarri [name] Pager: [phone]       After Hours (After 5p/  First Contact Pager: [phone]  weekends / holidays): Second Contact Pager: [phone]   Chief Complaint: Weakness  History of Present Illness: Audrey [name] is a 38 year old female with past medical history of Lupus on hydoxychloroquine and prednisone who presents with one-week history of progressively worsening weakness.  Patient states that for the past 3 days she has been lying in bed with no energy to do her activities of daily living.  She has associated symptoms of dizziness,  abdominal pain, rash and joint pain.  She reports nausea with no vomiting and had an episode of chest pain while eating breakfast that resolved spontaneously.  She denies fever/chills, shortness of breath, vision changes, diarrhea, dysuria or leg swelling.  She reports being diagnosed with lupus in August 2019. She reports symptoms of hot flashes and joint swelling and diffuse rash that comes and goes. When she was diagnosed she was started on a prednisone taper and hydroxychloroquine. She reports she is currently taking prednisone 10 mg a day and will decrease the dose to 5 mg on 1/12. She reports strict adherence to her prednisone taper regimen and hydroxychloroquine regimen.    She reports the first day of her last period was December 27 and states it was heavier than usual.  She has an IUD in place.    Meds:  Current Meds  Medication Sig  . hydroxychloroquine (PLAQUENIL) 200 MG tablet 2 tabs by mouth once daily (Patient taking differently: Take 400 mg by mouth daily. )  . ibuprofen (ADVIL,MOTRIN) 200  MG tablet Take 400 mg by mouth every 6 (six) hours as needed for fever.   Marland Kitchen levonorgestrel (MIRENA) 20 MCG/24HR IUD 1 each by Intrauterine route once.  . predniSONE (DELTASONE) 10 MG tablet Taper prednisone weekly as ordered in your patient information sheets (Patient taking differently: Take 10 mg by mouth daily with breakfast. )     Allergies: Allergies as of 12/01/2018  . (No Known Allergies)   Past Medical History:  Diagnosis Date  . Lupus ([place])     Family History: Mother: Diabetes, esophageal cancer  Social History: Tobacco use: Denies Alcohol use: Denies Illicit drug use: Denies  Review of Systems: A complete ROS was negative except as per HPI.   Physical Exam: Blood pressure 102/68, pulse (!) 114, temperature 98.4 F (36.9 C), temperature source Oral, resp. rate 16, SpO2 100 %. Physical Exam  Constitutional: She is oriented to person, place, and time.  HENT:  Head: Normocephalic and atraumatic.  Malar rash  Neck: Normal range of motion. Neck supple.  Cardiovascular: Regular rhythm, normal heart sounds and intact distal pulses. Exam reveals no gallop and no friction rub.  No murmur heard. Tachycardic  Pulmonary/Chest: Effort normal and breath sounds normal. No respiratory distress. She has no wheezes. She has no rales.  Abdominal: Soft. Bowel sounds are normal. She exhibits no distension and no mass. There is no abdominal tenderness. There is no rebound  and no guarding.  Rash Left lower quadrant with mild tenderness  Musculoskeletal:        General: No edema.  Neurological: She is alert and oriented to person, place, and time.  Skin:  Rash on face, hands, back, and abdomen  Psychiatric: Mood, memory, affect and judgment normal.    EKG: personally reviewed my interpretation is sinus tachycardia   Assessment & Plan by Problem: Principal Problem:   Weakness Active Problems:   Sinus tachycardia   Normocytic anemia   Abdominal pain   SLE (systemic lupus  erythematosus related syndrome) [facility])  [name] is a 38 year old female with past medical history of systemic lupus erythematosus That presents with a one-week history of progressive weakness.  She was found to be anemic at 8.2.  Systemic lupus erythematosus flare Patient has a rash on face, hands, abdomen and back and likely is an acute mild lupus flare.  The rash is neither pruritic or painful. She is currently on a prednisone taper by her primary care provider.  She has decreased her prednisone from 30 mg to now 10 mg daily.  Per clinic notes on 12/12 patient was on 30 mg of prednisone and rash had faded and joint pain had improved greatly.  Patient appears to have a mild flare with a decrease in prednisone. Will increase while in the hospital.  Will also obtain chest x-ray due to episode of chest pain.  Initial troponin negative.  Will also obtain CK for possible myopathy causing weakness due to a potential side effect of hydroxychloroquine.  -Increase prednisone to 50 mg daily -Continue hydroxychloroquine -chest x-ray -esr -complement 3,4 -ds-dna  Normocytic anemia Sinus tachycardia Patient has had a significant drop in her hemoglobin over a 2 month period from 12 to 9.6.  In 24 hours she went down from 9.6 to 8.2.  No signs of bleeding, FOBT neg.  CT abd/pelvis without acute abnormalities. Will obtain a hemolysis workup along with iron studies. She has received a liter fluid in the ED and will continue with maintenance fluids.  -LDH, and direct bilirubin, haptoglobin, peripheral blood smear, reticulocyte, iron/TIBC, and ferritin -IV fluids -CK -cbc   Abdominal pain With hemoglobin drop, positive point-of-care pregnancy test and abdominal pain there was concern for ectopic pregnancy the patient was a valued in the ED.  Transvaginal ultrasound was ordered as well as serum beta hCG.  Serum beta-HCG was negative for pregnancy.  Ultrasound did not show an ectopic mass.  CT abdomen  and pelvis did not show any acute abnormality to explain her pain. GC/Chlamydia and wet prep was done. Wet prep showed clue cells.  Will treat for BV. -metronidazole  Dispo: Admit patient to Inpatient with expected length of stay greater than 2 midnights.  Signed: Valinda Party, DO 12/02/2018, 9:20 AM  Pager: [phone]

## 2018-12-02 NOTE — ED Notes (Signed)
Patient currently at ultrasound .  

## 2018-12-03 ENCOUNTER — Other Ambulatory Visit: Payer: Self-pay

## 2018-12-03 ENCOUNTER — Encounter (HOSPITAL_COMMUNITY): Payer: Self-pay | Admitting: General Practice

## 2018-12-03 DIAGNOSIS — D72819 Decreased white blood cell count, unspecified: Secondary | ICD-10-CM

## 2018-12-03 DIAGNOSIS — D649 Anemia, unspecified: Secondary | ICD-10-CM

## 2018-12-03 DIAGNOSIS — N760 Acute vaginitis: Secondary | ICD-10-CM

## 2018-12-03 HISTORY — DX: Anemia, unspecified: D64.9

## 2018-12-03 LAB — BASIC METABOLIC PANEL
ANION GAP: 9 (ref 5–15)
BUN: <5 mg/dL — ABNORMAL LOW (ref 6–20)
CO2: 23 mmol/L (ref 22–32)
Calcium: 7.4 mg/dL — ABNORMAL LOW (ref 8.9–10.3)
Chloride: 101 mmol/L (ref 98–111)
Creatinine, Ser: 0.66 mg/dL (ref 0.44–1.00)
GFR calc Af Amer: >60 mL/min (ref >60)
GFR calc non Af Amer: >60 mL/min (ref >60)
Glucose, Bld: 82 mg/dL (ref 70–99)
Potassium: 4.1 mmol/L (ref 3.5–5.1)
Sodium: 133 mmol/L — ABNORMAL LOW (ref 135–145)

## 2018-12-03 LAB — CBC
HCT: 25.2 % — ABNORMAL LOW (ref 36.0–46.0)
Hemoglobin: 8.2 g/dL — ABNORMAL LOW (ref 12.0–15.0)
MCH: 27.3 pg (ref 26.0–34.0)
MCHC: 32.5 g/dL (ref 30.0–36.0)
MCV: 84 fL (ref 80.0–100.0)
Platelets: 228 10*3/uL (ref 150–400)
RBC: 3 MIL/uL — ABNORMAL LOW (ref 3.87–5.11)
RDW: 14.2 % (ref 11.5–15.5)
WBC: 3 10*3/uL — ABNORMAL LOW (ref 4.0–10.5)
nRBC: 0 % (ref 0.0–0.2)

## 2018-12-03 LAB — PATHOLOGIST SMEAR REVIEW

## 2018-12-03 LAB — C3 COMPLEMENT: C3 Complement: 50 mg/dL — ABNORMAL LOW (ref 82–167)

## 2018-12-03 LAB — C4 COMPLEMENT: COMPLEMENT C4, BODY FLUID: 7 mg/dL — AB (ref 14–44)

## 2018-12-03 LAB — ANTI-DNA ANTIBODY, DOUBLE-STRANDED: ds DNA Ab: 4 IU/mL (ref 0–9)

## 2018-12-03 LAB — HAPTOGLOBIN: Haptoglobin: 251 mg/dL (ref 33–278)

## 2018-12-03 MED ORDER — CALCIUM CARBONATE-VITAMIN D 500-200 MG-UNIT PO TABS
2.0000 | ORAL_TABLET | Freq: Every day | ORAL | Status: DC
  Administered 2018-12-04: 2 via ORAL
  Filled 2018-12-03: qty 2

## 2018-12-03 MED ORDER — ENSURE ENLIVE PO LIQD
237.0000 mL | Freq: Two times a day (BID) | ORAL | Status: DC
  Administered 2018-12-04: 237 mL via ORAL

## 2018-12-03 NOTE — Plan of Care (Signed)
  Problem: Elimination: Goal: Will not experience complications related to bowel motility Outcome: Progressing Goal: Will not experience complications related to urinary retention Outcome: Progressing   Problem: Pain Managment: Goal: General experience of comfort will improve Outcome: Progressing   Problem: Safety: Goal: Ability to remain free from injury will improve Outcome: Progressing   

## 2018-12-03 NOTE — Progress Notes (Addendum)
   Subjective: No overnight events. Patient reports that she feels better than yesterday. Her abdominal pain has resolved. All of her questions were answered.  Objective:  Vital signs in last 24 hours: Vitals:   12/02/18 1501 12/02/18 1815 12/02/18 2134 12/03/18 0542  BP: 102/63 (!) 106/59 104/63 (!) 103/57  Pulse: 100 94 94 (!) 104  Resp:   16 16  Temp: 100.2 F (37.9 C) 99.2 F (37.3 C) 99.1 F (37.3 C) 99 F (37.2 C)  TempSrc: Oral Oral Oral   SpO2: 100% 100% 100% 100%   Gen: Laying comfortably in bed. No distress. Pulm: CTAB CV: RRR, no murmurs Ext: No edema. Skin: There is a papular erythematous eruption along the malar face, bilateral dorsal and palmar hands, chest, back, and upper thighs. The malar rash appears less red than yesterday.  Assessment/Plan:  Principal Problem:   Acute systemic lupus erythematosus (HCC) Active Problems:   Sinus tachycardia   Normocytic anemia   Abdominal pain   SLE (systemic lupus erythematosus related syndrome) [facility])  Mrs. Spadafora is a 38 year old female with a medical history of SLE on plaquenil and prednisone who presented with two weeks of fatigue, weakness, joint pain, and skin rash while tapering her prednisone dose. She was admitted for acute SLE flare.   Acute SLE flare - Patient feels improved today - CXR without signs of pericardial effusion. UA without signs of renal injury. LFTs without sign of liver damage. - RPR and HIV non-reactive - ESR 58 (was 38 four months ago). C4 and C3 are decreased. CK wnl.  - Spoke with Dr. Elza Rafter of [facility] rheumatology. She agreed with the diagnosis of acute lupus flare. She advised that the Plaquenil can take 3 months to show effect and 6 months to show full effect. In the meantime, the patient may require more prednisone to control her symptoms. She recommended discharge on 38m prednisone with another trial of a taper down to 169m Taper schedule: 2047m5 days, 54m79m days,  10mg58meir rheumatology office will call her on Monday to set up a hospital f/u appointment. Dr. KlionDagoberto Ligas recommended Vitamin D and Ca supplementation for chronic prednisone use. Plan - Prednisone 50mg 55my - Continue plaquenil at home dose - Start Calcium-Vit D supplement - Contact her rheumatologist Dr. SwansoNorberto Sorensonmocytic Anemia - Hb decreased from 9.6 on admission to 8.2 today. Normal iron. Also leukopenic to 3. Platelets normal at 228. - DAT: complement negative, IgG positive. However, her bilirubin is normal, haptoglobin is normal, and type and cross was completed without issue. 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  - Her anemia is more likely 2/2 anemia of chronic disease.  Plan - Daily CBC - Repeat LDH  Bacterial Vaginosis - Clue cells on admission wet prep Plan - Metronidazole 500mg B31mor 7 days (to end 1/16 am)  Dispo: Anticipated discharge in approximately 1-2 day(s).   Alila Sotero, DeborahAndree Elk10/2020, 6:36 AM Pager: [phone]

## 2018-12-04 LAB — CBC
HCT: 25.5 % — ABNORMAL LOW (ref 36.0–46.0)
Hemoglobin: 8.1 g/dL — ABNORMAL LOW (ref 12.0–15.0)
MCH: 26.4 pg (ref 26.0–34.0)
MCHC: 31.8 g/dL (ref 30.0–36.0)
MCV: 83.1 fL (ref 80.0–100.0)
Platelets: 197 10*3/uL (ref 150–400)
RBC: 3.07 MIL/uL — ABNORMAL LOW (ref 3.87–5.11)
RDW: 14 % (ref 11.5–15.5)
WBC: 2 10*3/uL — ABNORMAL LOW (ref 4.0–10.5)
nRBC: 0 % (ref 0.0–0.2)

## 2018-12-04 LAB — BASIC METABOLIC PANEL
Anion gap: 8 (ref 5–15)
BUN: 6 mg/dL (ref 6–20)
CALCIUM: 8.2 mg/dL — AB (ref 8.9–10.3)
CO2: 26 mmol/L (ref 22–32)
Chloride: 103 mmol/L (ref 98–111)
Creatinine, Ser: 0.63 mg/dL (ref 0.44–1.00)
GFR calc Af Amer: >60 mL/min (ref >60)
GFR calc non Af Amer: >60 mL/min (ref >60)
Glucose, Bld: 92 mg/dL (ref 70–99)
Potassium: 3.9 mmol/L (ref 3.5–5.1)
Sodium: 137 mmol/L (ref 135–145)

## 2018-12-04 LAB — LACTATE DEHYDROGENASE: LDH: 368 U/L — ABNORMAL HIGH (ref 98–192)

## 2018-12-04 MED ORDER — CALCIUM CARBONATE-VITAMIN D 500-200 MG-UNIT PO TABS: 2.0000 | ORAL_TABLET | Freq: Every day | ORAL | Status: AC

## 2018-12-04 MED ORDER — METRONIDAZOLE 500 MG PO TABS: 500.0000 mg | ORAL_TABLET | Freq: Two times a day (BID) | ORAL | 0 refills | Status: DC

## 2018-12-04 MED ORDER — PREDNISONE 10 MG PO TABS: ORAL_TABLET | ORAL | 1 refills | Status: DC

## 2018-12-04 NOTE — Progress Notes (Signed)
   Subjective: Patient states that she is feeling better overall but continues to have some shaking in her hands. She has been able to ambulate without difficulty and feels less weak. We discussed that we have coordinated her care with rheumatology and they will contact her Monday to schedule a follow-up. She will be discharged on Prednisone 20 mg QD and her current dose of Plaquenil. We discussed discharge home. Patient agrees. All questions and concerns addressed.   Objective: Vital signs in last 24 hours: Vitals:   12/03/18 0542 12/03/18 1453 12/03/18 2200 12/04/18 0606  BP: (!) 103/57 100/67  108/64  Pulse: (!) 104 (!) 104 88 83  Resp: 16 17  16   Temp: 99 F (37.2 C)   (!) 97.5 F (36.4 C)  TempSrc:    Oral  SpO2: 100% 100%  99%   General: Well nourished female in no acute distress CV: RRR, no murmurs, no rubs  Skin: Warm and dry, malar rash improving, rash on the hands improving.    Assessment/Plan:  Principal Problem:   Acute systemic lupus erythematosus (HCC) Active Problems:   Sinus tachycardia   Normocytic anemia   Abdominal pain   SLE (systemic lupus erythematosus related syndrome) [facility])  Mrs. Evangelista is a 38 year old female with a medical history of SLE on plaquenil and prednisone who presented with two weeks of fatigue, weakness, joint pain, and skin rash while tapering her prednisone dose.She was admitted for acute SLE flare.   Acute SLE flare - Clinical status has improved significantly - dsDNA antibody was negative Plan - Prednisone 50mg  daily, with plans to transition to 20 mg on discharge followed by a taper  - Continue plaquenil at home dose - Calcium-Vit D supplement - Hospital f/u with [facility] rheumatology  Normocytic Anemia - Hb stable at 8.1 today. - Repeat LDH slightly elevated today  - Her anemia is likely 2/2 anemia of chronic disease.  Plan - Repeat CBC with rheumatology   Bacterial Vaginosis - Clue cells on admission wet  prep Plan - Metronidazole 500mg  BID for 7 days (to end 1/16 am)  Dispo: Discharge today.   Levora Dredge, MD 12/04/2018, 10:10 AM

## 2018-12-04 NOTE — Care Management (Signed)
Spoke with patient regarding filling prescriptions.  Flagyl and Prednisone are a total of $15 with Good RX coupons.  Pt has an orange card for scripts so will probably be less.  Pt states she can pay up to $15 for prescriptions and will f/u with [facility].

## 2018-12-04 NOTE — Progress Notes (Signed)
Audrey [name] to be D/C'd  per MD order. Discussed with the patient and all questions fully answered.  VSS, Skin clean, dry and intact without evidence of skin break down, no evidence of skin tears noted.  IV catheter discontinued intact. Site without signs and symptoms of complications. Dressing and pressure applied.  An After Visit Summary was printed and given to the patient. Patient received prescription.  D/c education completed with patient/family including follow up instructions, medication list, d/c activities limitations if indicated, with other d/c instructions as indicated by MD - patient able to verbalize understanding, all questions fully answered.   Patient instructed to return to ED, call 911, or call MD for any changes in condition.   Patient to be escorted via WC, and D/C home via private auto.

## 2018-12-04 NOTE — Discharge Summary (Signed)
Name: Audrey [name] MRN: [id number] DOB: [birth year] 38 y.o. PCP: Julieanne Manson, MD  Date of Admission: 12/01/2018  7:38 PM Date of Discharge: 12/04/2018 Attending Physician: Dr. Mikey Bussing  Discharge Diagnosis: 1. Acute SLE flare 2. Normocytic anemia 3. Bacterial vaginosis  Discharge Medications: Allergies as of 12/04/2018   No Known Allergies     Medication List    STOP taking these medications   famotidine 20 MG tablet Commonly known as:  PEPCID     TAKE these medications   calcium-vitamin D 500-200 MG-UNIT tablet Commonly known as:  OSCAL WITH D Take 2 tablets by mouth daily with breakfast. Start taking on:  December 05, 2018   hydroxychloroquine 200 MG tablet Commonly known as:  PLAQUENIL 2 tabs by mouth once daily What changed:    how much to take  how to take this  when to take this  additional instructions   ibuprofen 200 MG tablet Commonly known as:  ADVIL,MOTRIN Take 400 mg by mouth every 6 (six) hours as needed for fever.   levonorgestrel 20 MCG/24HR IUD Commonly known as:  MIRENA 1 each by Intrauterine route once.   metroNIDAZOLE 500 MG tablet Commonly known as:  FLAGYL Take 1 tablet (500 mg total) by mouth every 12 (twelve) hours.   predniSONE 10 MG tablet Commonly known as:  DELTASONE Taper prednisone every 5 days as described in your discharge instructions. What changed:    additional instructions  Another medication with the same name was removed. Continue taking this medication, and follow the directions you see here.       Disposition and follow-up:   Audrey [name] was discharged from [facility] in Good condition.  At the hospital follow up visit please address:  1. Acute SLE flare - Presented with worsening rash, arthralgias, fatigue, weakness on plaquenil and prednisone 10mg  - Received two days of prednisone 50mg . Discharged on prednisone 20mg  with instructions to taper down to 10mg   over the next 10 days. - Continued on plaquenil without dose adjustment - Ensure appropriate f/u with rheumatology   2. Normocytic anemia - Hb 8.1 on discharge - Likely anemia of chronic disease - Repeat CBC  3. Bacterial vaginosis - Discharged with 7 day course of metronidazole  4.  Labs / imaging needed at time of follow-up: CBC  5.  Pending labs/ test needing follow-up: none  Follow-up Appointments:   Hospital Course by problem list: 1. Acute SLE flare: [name] is a 38 year old female with a medical history of SLE on plaquenil and prednisone who presented with two weeks of fatigue, weakness, joint pain, and skin rash while on plaquenil and a prednisone taper (from 30mg  to 10mg ). She was admitted for acute SLE flare. There were no signs of end organ damage as her kidney and liver function were normal and her neurologic status was intact. No signs of pericardial effusion. She received two days of 50mg  prednisone. Her symptoms improved and skin eruption faded. Her rheumatology team at [facility] was contacted. They recommended continuing the Plaquenil as it takes 6 months to show full effect and attempting another prednisone taper starting at 20mg . Patient was discharged on a prednisone taper from 20mg  to 10mg  over 10 days. She as also started on a calcium and vitamin D supplement for chronic glucocorticoids. She will f/u with [facility] rheumatology. Of note, dsDNA was negative.  2. Normocytic anemia: Hb 8.1 on discharge. Normal iron studies. Increased ferritin (acute phase reactant). Positive Coombs IgG with normal bilirubin,  haptoglobin, and type and cross. Elevated LDH. 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 Needs CBC for Hb check on f/u.  3. Bacterial vaginosis: Clue cells on wet prep on admission. Hx of abdominal pain. Treated with 7 days of flagyl. Pain  resolved before discharge.  Discharge Vitals:   BP 108/64 (BP Location: Left Arm)   Pulse 83   Temp (!) 97.5 F (36.4 C) (Oral)   Resp 16   SpO2 99%   Pertinent Labs, Studies, and Procedures:  CBC Latest Ref Rng & Units 12/04/2018 12/03/2018 12/02/2018  WBC 4.0 - 10.5 K/uL 2.0(L) 3.0(L) -  Hemoglobin 12.0 - 15.0 g/dL 8.1(L) 8.2(L) 8.2(L)  Hematocrit 36.0 - 46.0 % 25.5(L) 25.2(L) 25.3(L)  Platelets 150 - 400 K/uL 197 228 -   CMP Latest Ref Rng & Units 12/04/2018 12/03/2018 12/02/2018  Glucose 70 - 99 mg/dL 92 82 -  BUN 6 - 20 mg/dL 6 <3(B) -  Creatinine 0.48 - 1.00 mg/dL 8.89 1.69 -  Sodium 450 - 145 mmol/L 137 133(L) -  Potassium 3.5 - 5.1 mmol/L 3.9 4.1 -  Chloride 98 - 111 mmol/L 103 101 -  CO2 22 - 32 mmol/L 26 23 -  Calcium 8.9 - 10.3 mg/dL 8.2(L) 7.4(L) -  Total Protein 6.5 - 8.1 g/dL - - 6.2(L)  Total Bilirubin 0.3 - 1.2 mg/dL - - 0.5  Alkaline Phos 38 - 126 U/L - - 35(L)  AST 15 - 41 U/L - - 48(H)  ALT 0 - 44 U/L - - 33   CT abdomen 12/02/2018 No acute or significant finding. No cause bleeding/dropping hemoglobin identified.  Discharge Instructions: Discharge Instructions    Discharge instructions   Complete by:  As directed    It was a pleasure taking care of you, [name]!  1. You were hospitalized for an acute lupus flare. We increased the dose of your prednisone and your symptoms got better.  2. For your prednisone - Starting tomorrow, take 20mg  daily for 5 days - Then take 15mg  daily for 5 days - Then take 10mg  daily for 5 days and continue on 10mg . - You should take a Vitamin D and calcium supplement because you are on steroids. You can buy one over the counter.  3. Continue taking your Plaquenil  3. [facility] rheumatology will call you on Monday to schedule a follow-up appointment with them.   4. Continue taking metronidazole for your vaginal infection. Take this medication twice a day. Your last dose will be the morning of 12/09/2018.  Feel  free to call our clinic at [phone] if you have any questions.  Thanks, Dr. Avie Arenas   Increase activity slowly   Complete by:  As directed       Signed: Lalitha Ilyas, Cathleen Corti, MD 12/04/2018, 10:14 AM   Pager: [phone]

## 2019-10-12 ENCOUNTER — Other Ambulatory Visit: Payer: Self-pay

## 2019-10-12 DIAGNOSIS — Z20822 Contact with and (suspected) exposure to covid-19: Secondary | ICD-10-CM

## 2019-10-14 LAB — NOVEL CORONAVIRUS, NAA: SARS-CoV-2, NAA: NOT DETECTED

## 2019-10-15 ENCOUNTER — Telehealth: Payer: Self-pay | Admitting: General Practice

## 2019-10-15 NOTE — Telephone Encounter (Signed)
Negative COVID results given. Patient results "NOT Detected." Caller expressed understanding. ° °

## 2019-11-30 ENCOUNTER — Other Ambulatory Visit: Payer: Self-pay

## 2019-11-30 ENCOUNTER — Other Ambulatory Visit (INDEPENDENT_AMBULATORY_CARE_PROVIDER_SITE_OTHER): Payer: Self-pay

## 2019-11-30 DIAGNOSIS — N898 Other specified noninflammatory disorders of vagina: Secondary | ICD-10-CM

## 2019-11-30 DIAGNOSIS — R300 Dysuria: Secondary | ICD-10-CM

## 2019-11-30 LAB — POCT URINALYSIS DIPSTICK
Bilirubin, UA: NEGATIVE
Blood, UA: NEGATIVE
Glucose, UA: NEGATIVE
Ketones, UA: NEGATIVE
Nitrite, UA: NEGATIVE
Protein, UA: NEGATIVE
Spec Grav, UA: <=1.005 — AB (ref 1.010–1.025)
Urobilinogen, UA: 0.2 E.U./dL
pH, UA: 6.5 (ref 5.0–8.0)

## 2019-11-30 MED ORDER — METRONIDAZOLE 500 MG PO TABS: 500.0000 mg | ORAL_TABLET | Freq: Two times a day (BID) | ORAL | 0 refills | Status: DC

## 2019-11-30 NOTE — Progress Notes (Signed)
Patient here for UA due to dysuria, but admits to also having mild vaginal discharge and itching.  No odor or definitive pelvic pain Has an IUD Husband without symptoms. UA with + Leuks and negative nitrites. Self swab:   Wet prep with + clue cells and mild Whiff.  No trich or yeast seen. Metronidazole 500 mg twice daily for 7 days. Send urine for culture, though doubt infection.

## 2019-11-30 NOTE — Addendum Note (Signed)
Addended by: Kenly Xiao on: 11/30/2019 04:03 PM   Modules accepted: Orders  

## 2019-12-02 LAB — URINE CULTURE: Organism ID, Bacteria: NO GROWTH

## 2020-02-08 ENCOUNTER — Other Ambulatory Visit: Payer: Self-pay

## 2020-02-08 ENCOUNTER — Ambulatory Visit: Payer: Self-pay | Admitting: Internal Medicine

## 2020-02-08 ENCOUNTER — Encounter: Payer: Self-pay | Admitting: Internal Medicine

## 2020-02-08 ENCOUNTER — Other Ambulatory Visit: Payer: Self-pay | Admitting: Internal Medicine

## 2020-02-08 VITALS — BP 118/70 | HR 78 | Resp 12 | Ht 59.5 in | Wt 142.0 lb

## 2020-02-08 DIAGNOSIS — N898 Other specified noninflammatory disorders of vagina: Secondary | ICD-10-CM

## 2020-02-08 DIAGNOSIS — Z124 Encounter for screening for malignant neoplasm of cervix: Secondary | ICD-10-CM

## 2020-02-08 DIAGNOSIS — Z0000 Encounter for general adult medical examination without abnormal findings: Secondary | ICD-10-CM

## 2020-02-08 LAB — POCT WET PREP WITH KOH
Clue Cells Wet Prep HPF POC: NEGATIVE
KOH Prep POC: NEGATIVE
RBC Wet Prep HPF POC: NEGATIVE
Trichomonas, UA: NEGATIVE
Yeast Wet Prep HPF POC: NEGATIVE

## 2020-02-08 NOTE — Progress Notes (Signed)
Subjective:    Patient ID: Audrey [name], female   DOB: 05-22-1981, 39 y.o.   MRN: [id number]   HPI   CPE with pap  1.  Pap:  Last pap 3 years ago at [facility] and normal.    2.  Mammogram:  Never.  No family history of breast cancer.    3.  Osteoprevention:  She eats yogurt sometimes.  Willing to drink 3 cups of milk daily.  Her SLE limits her ability to be physically active, but would be willing to walk in a pool if she finds that available or use a stationary bike.    4.  Guaiac Cards:  Never.   5.  Colonoscopy:  Never.    6.  Immunizations:   Immunization History  Administered Date(s) Administered  . Influenza Inj Mdck Quad Pf 10/19/2018  . Influenza,inj,Quad PF,6+ Mos 02/26/2018  . Influenza-Unspecified 09/25/2019  . Td 05/23/2009  . Tdap 03/11/2015     7.  Glucose/Cholesterol:  History of elevated blood glucose and prediabetes when on Prednisone for her SLE.  Has been off prednisone completely since January of 2021.   Has not had a complete cholesterol panel done since 2010.    Current Meds  Medication Sig  . calcium-vitamin D (OSCAL WITH D) 500-200 MG-UNIT tablet Take 2 tablets by mouth daily with breakfast.  . hydroxychloroquine (PLAQUENIL) 200 MG tablet 2 tabs by mouth once daily (Patient taking differently: Take 400 mg by mouth daily. )  . ibuprofen (ADVIL,MOTRIN) 200 MG tablet Take 400 mg by mouth every 6 (six) hours as needed for fever.   Marland Kitchen levonorgestrel (MIRENA) 20 MCG/24HR IUD 1 each by Intrauterine route once.   No Known Allergies  Past Medical History:  Diagnosis Date  . Lupus (HCC)   . Symptomatic anemia 12/03/2018    Past Surgical History:  Procedure Laterality Date  . DILATION AND CURETTAGE OF UTERUS  2007   following SAB  . LAPAROSCOPIC CHOLECYSTECTOMY  2009    Family History  Problem Relation Age of Onset  . Cancer Mother        Esophageal.  . Diabetes Mother   . Hypertension Father   . Irritable bowel syndrome Sister   .  Hyperlipidemia Son     Social History   Socioeconomic History  . Marital status: Married    Spouse name: Clyda Hurdle  . Number of children: 3  . Years of education: 9  . Highest education level: Not on file  Occupational History  . Occupation: Housewife  Tobacco Use  . Smoking status: Never Smoker  . Smokeless tobacco: Never Used  Vaping Use  . Vaping Use: Never used  Substance and Sexual Activity  . Alcohol use: No  . Drug use: Never  . Sexual activity: Yes    Birth control/protection: I.U.D.  Other Topics Concern  . Not on file  Social History Narrative   Lives with husband and 3 children kids.   Social Determinants of Health   Financial Resource Strain:   . Difficulty of Paying Living Expenses:   Food Insecurity:   . Worried About Programme researcher, broadcasting/film/video in the Last Year:   . Barista in the Last Year:   Transportation Needs:   . Freight forwarder (Medical):   Marland Kitchen Lack of Transportation (Non-Medical):   Physical Activity:   . Days of Exercise per Week:   . Minutes of Exercise per Session:   Stress:   . Feeling of Stress :  Social Connections:   . Frequency of Communication with Friends and Family:   . Frequency of Social Gatherings with Friends and Family:   . Attends Religious Services:   . Active Member of Clubs or Organizations:   . Attends Banker Meetings:   Marland Kitchen Marital Status:   Intimate Partner Violence:   . Fear of Current or Ex-Partner:   . Emotionally Abused:   Marland Kitchen Physically Abused:   . Sexually Abused:      Review of Systems  Constitutional: Positive for fatigue (some days not great--when having more pain from SLE). Negative for appetite change.  HENT: Negative for dental problem, hearing loss, rhinorrhea and sore throat.   Eyes: Negative for visual disturbance (Wears glasses.  Had eye check with [facility] for long term use of hydroxychloroquine.  ).  Respiratory: Negative for cough and shortness of breath.     Cardiovascular: Negative for chest pain, palpitations and leg swelling.  Gastrointestinal: Positive for constipation (At times.). Negative for abdominal pain, blood in stool (No melena) and diarrhea.  Genitourinary: Positive for vaginal discharge (Clear.  No odor.  No itching. ). Negative for dysuria and menstrual problem.  Musculoskeletal: Positive for arthralgias (Still with some joint pain, mainly smaller joints of hands and feet, though larger joints at times as well.  Rashes on her upper arms persist ast well.  Due to SLE.  ).  Skin: Positive for rash (See in joint information.).  Neurological: Negative for weakness and numbness.  Psychiatric/Behavioral: Negative for dysphoric mood. The patient is not nervous/anxious.       Objective:   BP 118/70 (BP Location: Left Arm, Patient Position: Sitting, Cuff Size: Normal)   Pulse 78   Resp 12   Ht 4' 11.5" (1.511 m)   Wt 142 lb (64.4 kg)   LMP 01/27/2020   BMI 28.20 kg/m   Physical Exam Constitutional:      Appearance: Normal appearance.  HENT:     Head: Normocephalic and atraumatic.     Right Ear: Hearing, tympanic membrane, ear canal and external ear normal.     Left Ear: Hearing, tympanic membrane, ear canal and external ear normal.     Nose: Nose normal.     Mouth/Throat:     Mouth: Mucous membranes are moist.     Pharynx: Oropharynx is clear. Uvula midline.  Eyes:     Extraocular Movements: Extraocular movements intact.     Conjunctiva/sclera: Conjunctivae normal.     Pupils: Pupils are equal, round, and reactive to light.  Cardiovascular:     Rate and Rhythm: Normal rate and regular rhythm.     Heart sounds: S1 normal and S2 normal. No murmur heard.  No friction rub. No S3 or S4 sounds.      Comments: Carotid, radial, femoral, DP and PT pulses normal and equal.  Pulmonary:     Effort: Pulmonary effort is normal.     Breath sounds: Normal breath sounds and air entry.  Chest:     Breasts:        Right: No swelling,  inverted nipple, mass, nipple discharge or tenderness.        Left: No swelling, inverted nipple, mass, nipple discharge or tenderness.  Abdominal:     General: Bowel sounds are normal.     Palpations: Abdomen is soft. There is no hepatomegaly, splenomegaly or mass.     Tenderness: There is no abdominal tenderness.     Hernia: No hernia is present.  Genitourinary:  Comments: Normal external female genitalia Cervix with copious mucousy yellow discharge.  IUD strings in place No uterine or adnexal mass or tendernss.  Musculoskeletal:        General: Normal range of motion.     Cervical back: Full passive range of motion without pain, normal range of motion and neck supple.  Lymphadenopathy:     Head:     Right side of head: No submental or submandibular adenopathy.     Left side of head: No submental or submandibular adenopathy.     Cervical: No cervical adenopathy.     Upper Body:     Right upper body: No supraclavicular or axillary adenopathy.     Left upper body: No supraclavicular or axillary adenopathy.     Lower Body: No right inguinal adenopathy. No left inguinal adenopathy.  Skin:    General: Skin is warm.     Capillary Refill: Capillary refill takes less than 2 seconds.  Neurological:     Mental Status: She is alert and oriented to person, place, and time.     Cranial Nerves: Cranial nerves are intact.     Sensory: Sensation is intact.     Motor: Motor function is intact.     Coordination: Coordination is intact.     Gait: Gait is intact.     Deep Tendon Reflexes: Reflexes are normal and symmetric.  Psychiatric:        Mood and Affect: Mood normal.        Behavior: Behavior normal.        Thought Content: Thought content normal.        Judgment: Judgment normal.      Wet prep:  Negative trich, neg KOH, neg clue or whiff.  Lots of WBCs  Few bacteria.  Assessment & Plan  1.  CPE with pap Return 1 week for fasting labs:  FLP, CBC, CmP, A1C (history of  prediabetes) Encouraged COVID and influenza vaccination-the latter inf fall.  2.  Vaginal Discharge:  Wet prep normal save for white cells.  GC/Chlamydia pending.

## 2020-02-09 LAB — CYTOLOGY - PAP

## 2020-02-13 LAB — GC/CHLAMYDIA PROBE AMP
Chlamydia trachomatis, NAA: NEGATIVE
Neisseria Gonorrhoeae by PCR: NEGATIVE

## 2020-02-16 ENCOUNTER — Other Ambulatory Visit: Payer: Self-pay

## 2020-02-16 ENCOUNTER — Other Ambulatory Visit (INDEPENDENT_AMBULATORY_CARE_PROVIDER_SITE_OTHER): Payer: Self-pay

## 2020-02-16 DIAGNOSIS — R739 Hyperglycemia, unspecified: Secondary | ICD-10-CM

## 2020-02-16 DIAGNOSIS — Z79899 Other long term (current) drug therapy: Secondary | ICD-10-CM

## 2020-02-16 DIAGNOSIS — Z13220 Encounter for screening for lipoid disorders: Secondary | ICD-10-CM

## 2020-02-17 LAB — COMPREHENSIVE METABOLIC PANEL
ALT: 14 IU/L (ref 0–32)
AST: 31 IU/L (ref 0–40)
Albumin/Globulin Ratio: 1.3 (ref 1.2–2.2)
Albumin: 4.1 g/dL (ref 3.8–4.8)
Alkaline Phosphatase: 94 IU/L (ref 39–117)
BUN/Creatinine Ratio: 15 (ref 9–23)
BUN: 9 mg/dL (ref 6–20)
Bilirubin Total: 0.2 mg/dL (ref 0.0–1.2)
CO2: 20 mmol/L (ref 20–29)
Calcium: 9.2 mg/dL (ref 8.7–10.2)
Chloride: 104 mmol/L (ref 96–106)
Creatinine, Ser: 0.6 mg/dL (ref 0.57–1.00)
GFR calc Af Amer: 134 mL/min/{1.73_m2} (ref >59)
GFR calc non Af Amer: 116 mL/min/{1.73_m2} (ref >59)
Globulin, Total: 3.1 g/dL (ref 1.5–4.5)
Glucose: 84 mg/dL (ref 65–99)
Potassium: 4.4 mmol/L (ref 3.5–5.2)
Sodium: 136 mmol/L (ref 134–144)
Total Protein: 7.2 g/dL (ref 6.0–8.5)

## 2020-02-17 LAB — CBC WITH DIFFERENTIAL/PLATELET
Basophils Absolute: 0 10*3/uL (ref 0.0–0.2)
Basos: 0 %
EOS (ABSOLUTE): 0 10*3/uL (ref 0.0–0.4)
Eos: 0 %
Hematocrit: 30.5 % — ABNORMAL LOW (ref 34.0–46.6)
Hemoglobin: 9.5 g/dL — ABNORMAL LOW (ref 11.1–15.9)
Immature Grans (Abs): 0 10*3/uL (ref 0.0–0.1)
Immature Granulocytes: 0 %
Lymphocytes Absolute: 0.7 10*3/uL (ref 0.7–3.1)
Lymphs: 20 %
MCH: 23.9 pg — ABNORMAL LOW (ref 26.6–33.0)
MCHC: 31.1 g/dL — ABNORMAL LOW (ref 31.5–35.7)
MCV: 77 fL — ABNORMAL LOW (ref 79–97)
Monocytes Absolute: 0.2 10*3/uL (ref 0.1–0.9)
Monocytes: 7 %
Neutrophils Absolute: 2.5 10*3/uL (ref 1.4–7.0)
Neutrophils: 73 %
Platelets: 257 10*3/uL (ref 150–450)
RBC: 3.98 x10E6/uL (ref 3.77–5.28)
RDW: 14.8 % (ref 11.7–15.4)
WBC: 3.5 10*3/uL (ref 3.4–10.8)

## 2020-02-17 LAB — LIPID PANEL W/O CHOL/HDL RATIO
Cholesterol, Total: 131 mg/dL (ref 100–199)
HDL: 33 mg/dL — ABNORMAL LOW (ref >39)
LDL Chol Calc (NIH): 76 mg/dL (ref 0–99)
Triglycerides: 121 mg/dL (ref 0–149)
VLDL Cholesterol Cal: 22 mg/dL (ref 5–40)

## 2020-02-17 LAB — HGB A1C W/O EAG: Hgb A1c MFr Bld: 5.1 % (ref 4.8–5.6)

## 2020-03-05 ENCOUNTER — Other Ambulatory Visit: Payer: Self-pay | Admitting: Internal Medicine

## 2020-03-05 MED ORDER — FERROUS GLUCONATE 324 (38 FE) MG PO TABS: 324.0000 mg | ORAL_TABLET | Freq: Two times a day (BID) | ORAL | 3 refills | Status: DC

## 2020-03-05 NOTE — Progress Notes (Signed)
Microcytic anemia, improving Start Ferrous gluconate 325 mg twice daily

## 2020-05-08 ENCOUNTER — Other Ambulatory Visit: Payer: Self-pay

## 2020-05-08 DIAGNOSIS — D649 Anemia, unspecified: Secondary | ICD-10-CM

## 2020-05-09 LAB — CBC WITH DIFFERENTIAL/PLATELET
Basophils Absolute: 0 10*3/uL (ref 0.0–0.2)
Basos: 0 %
EOS (ABSOLUTE): 0 10*3/uL (ref 0.0–0.4)
Eos: 0 %
Hematocrit: 35.5 % (ref 34.0–46.6)
Hemoglobin: 11.5 g/dL (ref 11.1–15.9)
Immature Grans (Abs): 0 10*3/uL (ref 0.0–0.1)
Immature Granulocytes: 0 %
Lymphocytes Absolute: 1.2 10*3/uL (ref 0.7–3.1)
Lymphs: 26 %
MCH: 26.6 pg (ref 26.6–33.0)
MCHC: 32.4 g/dL (ref 31.5–35.7)
MCV: 82 fL (ref 79–97)
Monocytes Absolute: 0.2 10*3/uL (ref 0.1–0.9)
Monocytes: 5 %
Neutrophils Absolute: 3.1 10*3/uL (ref 1.4–7.0)
Neutrophils: 69 %
Platelets: 179 10*3/uL (ref 150–450)
RBC: 4.32 x10E6/uL (ref 3.77–5.28)
RDW: 20.9 % — ABNORMAL HIGH (ref 11.7–15.4)
WBC: 4.5 10*3/uL (ref 3.4–10.8)

## 2020-07-23 ENCOUNTER — Other Ambulatory Visit (INDEPENDENT_AMBULATORY_CARE_PROVIDER_SITE_OTHER): Payer: Self-pay

## 2020-07-23 DIAGNOSIS — Z9189 Other specified personal risk factors, not elsewhere classified: Secondary | ICD-10-CM

## 2020-07-23 LAB — POC COVID19 BINAXNOW: SARS Coronavirus 2 Ag: NEGATIVE

## 2020-07-23 NOTE — Progress Notes (Signed)
Patient came in for testing due to husband starting with cough and headache since Friday. Patient results are negative as well as husband. Again patient was given precautions on what to do if you feel ill or present with symptoms.

## 2020-08-10 ENCOUNTER — Ambulatory Visit: Payer: Self-pay | Admitting: Internal Medicine

## 2020-08-10 ENCOUNTER — Encounter: Payer: Self-pay | Admitting: Internal Medicine

## 2020-08-10 VITALS — BP 102/72 | HR 64 | Resp 12 | Ht 59.5 in | Wt 134.0 lb

## 2020-08-10 DIAGNOSIS — D509 Iron deficiency anemia, unspecified: Secondary | ICD-10-CM

## 2020-08-10 DIAGNOSIS — M329 Systemic lupus erythematosus, unspecified: Secondary | ICD-10-CM

## 2020-08-10 DIAGNOSIS — M1990 Unspecified osteoarthritis, unspecified site: Secondary | ICD-10-CM

## 2020-08-10 NOTE — Progress Notes (Signed)
    Subjective:    Patient ID: Audrey [name], female   DOB: 18-May-1981, 39 y.o.   MRN: [id number]   HPI   6 months follow up: Interpreted  1.  SLE:  Feels she is doing well.  Some soreness and stiffness for about 1 hour, then resolves.  No erythema or swelling of joints.  No rashes as she has had before. On Hydroxychloroquine and has had ophtho check this year-February 4th with Dr. Earl Many at [facility].  Apparent focal pigment epithelial detachment OD in area usually not affected by hydroxychloroquine toxicity.  Felt to likely be a benign finding.    2.  Anemia:  Resolved with recheck in June.  She continues to take Ferrous gluconate daily.  Iron studies supported iron deficiency anemia.  Feels better with increased hemoglobin.    3.  History of prediabetes:  A1C was excellent at CPE in March:  5.1%.  4.  Dizziness:  Better since anemia resolved. Still having 2 episodes twice weekly.  Difficult history.  Cannot describe the dizziness.  Generally, after doing work in the house or when doing work in the home.  Drinks 24 oz of water daily.  Drinks soda as well.     Current Meds  Medication Sig   calcium-vitamin D (OSCAL WITH D) 500-200 MG-UNIT tablet Take 2 tablets by mouth daily with breakfast.   ferrous gluconate (FERGON) 324 MG tablet Take 1 tablet (324 mg total) by mouth 2 (two) times daily with a meal.   hydroxychloroquine (PLAQUENIL) 200 MG tablet 2 tabs by mouth once daily (Patient taking differently: Take 400 mg by mouth daily. )   ibuprofen (ADVIL,MOTRIN) 200 MG tablet Take 400 mg by mouth every 6 (six) hours as needed for fever.    levonorgestrel (MIRENA) 20 MCG/24HR IUD 1 each by Intrauterine route once.   No Known Allergies   Review of Systems    Objective:   BP 102/72 (BP Location: Left Arm, Patient Position: Sitting, Cuff Size: Normal)   Pulse 64   Resp 12   Ht 4' 11.5" (1.511 m)   Wt 134 lb (60.8 kg)   LMP 07/29/2020   BMI 26.61 kg/m   Physical  Exam NAD HEENT:  PERRL, EOMI Neck:  Supple, No adenopathy Chest:  CTA CV:  RRR with normal S1 and S2, No S3, S4 or murmur.  Radial and DP pulses normal and equal Abd:  S, NT, No HSM or mass + BS MS/skin:  No erythema or swelling of joints.  No effusion or synovial thickening.  Skin without rash, though skin of hands and feet darkened in comparison to other areas.      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  Eye exam up to date.  2.  Anemia/dizziness:  normalized with iron supplementation.  3.  HM:  encouraged obtaining flu vaccine at one of our free vaccine clinics.  F/U in 3 weeks for [employer].  6 months for CPE without pap

## 2020-08-11 LAB — CBC WITH DIFFERENTIAL/PLATELET
Basophils Absolute: 0 10*3/uL (ref 0.0–0.2)
Basos: 0 %
EOS (ABSOLUTE): 0 10*3/uL (ref 0.0–0.4)
Eos: 1 %
Hematocrit: 36.9 % (ref 34.0–46.6)
Hemoglobin: 12.3 g/dL (ref 11.1–15.9)
Immature Grans (Abs): 0 10*3/uL (ref 0.0–0.1)
Immature Granulocytes: 0 %
Lymphocytes Absolute: 0.8 10*3/uL (ref 0.7–3.1)
Lymphs: 22 %
MCH: 28.9 pg (ref 26.6–33.0)
MCHC: 33.3 g/dL (ref 31.5–35.7)
MCV: 87 fL (ref 79–97)
Monocytes Absolute: 0.2 10*3/uL (ref 0.1–0.9)
Monocytes: 5 %
Neutrophils Absolute: 2.7 10*3/uL (ref 1.4–7.0)
Neutrophils: 72 %
Platelets: 174 10*3/uL (ref 150–450)
RBC: 4.26 x10E6/uL (ref 3.77–5.28)
RDW: 13 % (ref 11.7–15.4)
WBC: 3.8 10*3/uL (ref 3.4–10.8)

## 2020-09-19 ENCOUNTER — Telehealth: Payer: Self-pay | Admitting: Internal Medicine

## 2020-09-19 NOTE — Telephone Encounter (Signed)
Pt. Called stating has been feeling dizziness and headaches just in the left side  x about 8 days. Patient stated  Today symptoms got worse. Also patient stated left eye hurts.  Patient stated took 2 pills of ibuprofen 200 mg a hour ago but did not help. Patient stated at time feels hot sweats.  Pt. Requesting advise on what to do.

## 2020-09-19 NOTE — Telephone Encounter (Signed)
Pt. Scheduled for tomorrow at 12:30pm Acute appointment . Nicky informed pt.

## 2020-09-20 ENCOUNTER — Ambulatory Visit: Payer: Self-pay | Admitting: Internal Medicine

## 2020-09-20 ENCOUNTER — Other Ambulatory Visit: Payer: Self-pay

## 2020-09-20 ENCOUNTER — Encounter: Payer: Self-pay | Admitting: Internal Medicine

## 2020-09-20 VITALS — BP 121/76 | HR 90 | Resp 12 | Ht 60.0 in | Wt 130.5 lb

## 2020-09-20 DIAGNOSIS — R519 Headache, unspecified: Secondary | ICD-10-CM

## 2020-09-20 MED ORDER — HYDROCODONE-ACETAMINOPHEN 5-325 MG PO TABS: 1.0000 | ORAL_TABLET | Freq: Four times a day (QID) | ORAL | 0 refills | Status: DC | PRN

## 2020-09-20 NOTE — Progress Notes (Signed)
Subjective:    Patient ID: Audrey [name], female   DOB: [birth year], 39 y.o.   MRN: [id number]   HPI    HPI   Audrey [name] interprets  Left sided constant headache started 8 days ago--came on suddenly.  She was driving at the time.  Points to her upper eye and onto frontal then temporal parietal head.  She has had blurry vision on the left only since the pain started.   Describes the pain as pressure pushing out of her head.  At times throbbing.    Feels she needs to close her eyes due to the pain.  Describes photophobia.  Did have nausea yesterday, but no vomiting. She has never had this before. At times has dizziness with the headache.   Has had other headaches in the past after coming home from work stressed.  Puts her hands over her middle frontal head as the source of the pain.  Describes pressure out as well. Ultimately, states the same quality of pain, just in a different place. No family history of migraines. When she takes ibuprofen 400 mg, this headache improves, but does not resolve.   She has been able to sleep, but awakens with the headache.  No sore throat, cough, fever.  No numbness tingling or weakness of any body part.    She does not believe she has had more stress recently.  Feels her lupus symptoms are well controlled currently.     Current Meds  Medication Sig  . calcium-vitamin D (OSCAL WITH D) 500-200 MG-UNIT tablet Take 2 tablets by mouth daily with breakfast.  . ferrous gluconate (FERGON) 324 MG tablet Take 1 tablet (324 mg total) by mouth 2 (two) times daily with a meal.  . hydroxychloroquine (PLAQUENIL) 200 MG tablet 2 tabs by mouth once daily  . ibuprofen (ADVIL,MOTRIN) 200 MG tablet Take 400 mg by mouth every 6 (six) hours as needed for fever.   Marland Kitchen levonorgestrel (MIRENA) 20 MCG/24HR IUD 1 each by Intrauterine route once.  . methotrexate (RHEUMATREX) 2.5 MG tablet Take 2.5 mg by mouth once a week. Take 8 tablets by mouth once  a week   No Known Allergies   Review of Systems    Objective:   BP 121/76 (BP Location: Left Arm, Patient Position: Sitting, Cuff Size: Normal)   Pulse 90   Resp 12   Ht 5' (1.524 m)   Wt 130 lb 8 oz (59.2 kg)   LMP 09/07/2020 (Exact Date)   BMI 25.49 kg/m   Physical Exam  Mild to moderate discomfort, holding left frontal head HEENT:  PERRL, EOMI, discs sharp bilaterally.  TMs pearly gray, nasal mucosa without swelling and no nasal discharge.  NT over sinuses.  Throat without injection Neck:  Supple,  Mild tenderness over left trap and SCM muscles. Chest:  CTA CV:  RRR without murmur or rub.  Radial and DP pulses normal and equal Neuro:  A & O x 3, CN  II-XII grossly intact.  DTRs difficult to obtain, but symmetric, Motor 5/5 throughout.  Sensory to light touch grossly normal.  Gait normal.  Romberg negative.     Assessment & Plan   Left sided headache--new and lasting now for 8 days without significant improvement in patient with SLE.  No definitive concerning findings, but will send for CT scan of head.  Working on getting this in next 24 hours through [facility].   Needs temporary orange card. Rx for Hydrocodone/tylenol 5/325 mg to take  every 6 hours for severe headache and use ibuprofen 600 mg twice daily with food for now. Possibly new onset migraines based on history.

## 2020-09-20 NOTE — Patient Instructions (Signed)
Use Hydrocodone for severe pain and Ibuprofen 600 mg twice daily with meals

## 2020-09-21 ENCOUNTER — Telehealth: Payer: Self-pay | Admitting: Internal Medicine

## 2020-09-21 NOTE — Telephone Encounter (Signed)
Estefania called to notify patient the CT of head was normal--had to leave message to call

## 2020-11-24 DIAGNOSIS — F410 Panic disorder [episodic paroxysmal anxiety] without agoraphobia: Secondary | ICD-10-CM

## 2020-11-24 HISTORY — DX: Panic disorder (episodic paroxysmal anxiety): F41.0

## 2020-12-12 ENCOUNTER — Other Ambulatory Visit: Payer: Self-pay

## 2020-12-12 ENCOUNTER — Other Ambulatory Visit (INDEPENDENT_AMBULATORY_CARE_PROVIDER_SITE_OTHER): Payer: Self-pay | Admitting: Internal Medicine

## 2020-12-12 DIAGNOSIS — U071 COVID-19: Secondary | ICD-10-CM

## 2020-12-12 DIAGNOSIS — R059 Cough, unspecified: Secondary | ICD-10-CM

## 2020-12-12 LAB — POC COVID19 BINAXNOW: SARS Coronavirus 2 Ag: POSITIVE — AB

## 2020-12-12 NOTE — Progress Notes (Addendum)
Exposed to someone with COVID on Saturday, Jan 15th. She has had cough, headache and general malaise for 4 to 5 days. Her husband has had headache and general malaise for 2 days. Both vaccinated and both with booster week before this past Monday.   COVID testing Positive  Discussed isolating in home from rest of family for next 24 hours.  If continues to improve and no fever, can mask up in home and be around others.  Also, can be out, but needs to wear mask.  Recommend N95 or KN95 mask.  Call if develops worsening symptoms, especially if dyspnea/chest discomfort.  See recommendations for symptomatic partially vaccinated 40 yo and asymptomatic fully vaccinated 36 yo in father's chart today.

## 2021-02-13 ENCOUNTER — Encounter: Payer: Self-pay | Admitting: Internal Medicine

## 2021-02-13 ENCOUNTER — Other Ambulatory Visit: Payer: Self-pay

## 2021-02-13 ENCOUNTER — Ambulatory Visit: Payer: Self-pay | Admitting: Internal Medicine

## 2021-02-13 VITALS — BP 124/74 | HR 72 | Resp 18 | Ht 59.5 in | Wt 134.2 lb

## 2021-02-13 DIAGNOSIS — Z0000 Encounter for general adult medical examination without abnormal findings: Secondary | ICD-10-CM

## 2021-02-13 DIAGNOSIS — F419 Anxiety disorder, unspecified: Secondary | ICD-10-CM

## 2021-02-13 NOTE — Progress Notes (Signed)
Subjective:    Patient ID: Audrey [name], female   DOB: 14-Jan-1981, 40 y.o.   MRN: [id number]   HPI   CPE without pap  1.  Pap:  Last performed 02/08/20 and normal  2.  Mammogram:  Never.  No family history of breast cancer.   3.  Osteoprevention:  Taking Calcium and vitamin D tabs daily.  Does drink 2 cups of milk most days as well.  Walks a bit every day--cannot quantitate.  4.  Guaiac Cards: Negative in 11/2018.  5.  Colonoscopy:  Never.  No family history of colon cancer.    6.  Immunizations: Did receive Moderna booster in January.  7.  Glucose/Cholesterol:  History of mild prediabetes in 2019 with A1C at 5.7%.  Down to 5.1 % in 01/2020.  Cholesterol panel 01/2020 was okay, but with low HDL at 33.  Lipid Panel     Component Value Date/Time   CHOL 131 02/16/2020 1000   TRIG 121 02/16/2020 1000   HDL 33 (L) 02/16/2020 1000   CHOLHDL 3.9 Ratio 05/23/2009 2137   VLDL 22 05/23/2009 2137   LDLCALC 76 02/16/2020 1000   LABVLDL 22 02/16/2020 1000      Current Meds  Medication Sig   calcium-vitamin D (OSCAL WITH D) 500-200 MG-UNIT tablet Take 2 tablets by mouth daily with breakfast.   ferrous gluconate (FERGON) 324 MG tablet Take 1 tablet (324 mg total) by mouth 2 (two) times daily with a meal.   folic acid (FOLVITE) 1 MG tablet Take 1 mg by mouth daily.   hydroxychloroquine (PLAQUENIL) 200 MG tablet 2 tabs by mouth once daily   ibuprofen (ADVIL,MOTRIN) 200 MG tablet Take 400 mg by mouth every 6 (six) hours as needed for fever.    levonorgestrel (MIRENA) 20 MCG/24HR IUD 1 each by Intrauterine route once.   methotrexate (RHEUMATREX) 2.5 MG tablet Take 2.5 mg by mouth once a week. Take 8 tablets by mouth once a week   No Known Allergies  Past Medical History:  Diagnosis Date   Lupus (HCC)    Symptomatic anemia 12/03/2018    Past Surgical History:  Procedure Laterality Date   DILATION AND CURETTAGE OF UTERUS  2007   following [facility]   LAPAROSCOPIC  CHOLECYSTECTOMY  2009   Family History  Problem Relation Age of Onset   Cancer Mother        Esophageal.   Diabetes Mother    Hypertension Father    Irritable bowel syndrome Sister    Hyperlipidemia Son     Social History   Socioeconomic History   Marital status: Married    Spouse name: Clyda Hurdle   Number of children: 3   Years of education: 9   Highest education level: Not on file  Occupational History   Occupation: Housewife  Tobacco Use   Smoking status: Never Smoker   Smokeless tobacco: Never Used  Building services engineer Use: Never used  Substance and Sexual Activity   Alcohol use: No   Drug use: Never   Sexual activity: Yes    Birth control/protection: I.U.D.  Other Topics Concern   Not on file  Social History Narrative   Lives with husband and 3 children kids.   Social Determinants of Health   Financial Resource Strain: Low Risk    Difficulty of Paying Living Expenses: Not hard at all  Food Insecurity: No Food Insecurity   Worried About Programme researcher, broadcasting/film/video in the Last Year: Never true  Ran Out of Food in the Last Year: Never true  Transportation Needs: No Transportation Needs   Lack of Transportation (Medical): No   Lack of Transportation (Non-Medical): No  Physical Activity: Not on file  Stress: Not on file  Social Connections: Not on file  Intimate Partner Violence: Not At Risk   Fear of Current or Ex-Partner: No   Emotionally Abused: No   Physically Abused: No   Sexually Abused: No      Review of Systems  Constitutional: Negative for appetite change, fatigue and unexpected weight change.  HENT: Negative for dental problem (No check in 2 years.  No orange card.  ), rhinorrhea, sneezing and sore throat.   Eyes: Negative for visual disturbance (Wears glasses.).  Respiratory: Negative for cough and shortness of breath.   Cardiovascular: Positive for palpitations (At times-seconds and gone.). Negative for chest pain.  Gastrointestinal: Negative  for abdominal pain, blood in stool, constipation and diarrhea.  Genitourinary: Negative for dysuria and menstrual problem.  Musculoskeletal: Negative for arthralgias (Doing well with treatment for SLE. ).  Neurological: Negative for weakness and numbness.  Psychiatric/Behavioral: Negative for dysphoric mood. The patient is nervous/anxious (Episodes once weekly up to 15 minutes.  Mood decreased, wants to cry or scream.).       Objective:   BP 124/74 (BP Location: Left Arm, Patient Position: Sitting, Cuff Size: Normal)   Pulse 72   Resp 18   Ht 4' 11.5" (1.511 m)   Wt 134 lb 4 oz (60.9 kg)   LMP 02/12/2021 Comment: Regular  BMI 26.66 kg/m   Physical Exam Chaperone present: Pelvic refused by patient as menstruating today..  Constitutional:      Appearance: She is normal weight.  HENT:     Head: Normocephalic and atraumatic.     Right Ear: Tympanic membrane, ear canal and external ear normal.     Left Ear: Tympanic membrane, ear canal and external ear normal.     Nose: Nose normal.     Mouth/Throat:     Mouth: Mucous membranes are moist.     Dentition: Normal dentition.     Pharynx: Oropharynx is clear.  Eyes:     Extraocular Movements: Extraocular movements intact.     Conjunctiva/sclera: Conjunctivae normal.     Pupils: Pupils are equal, round, and reactive to light.     Funduscopic exam:    Right eye: Red reflex present.        Left eye: Red reflex present.    Comments: Discs sharp bilaterally  Cardiovascular:     Rate and Rhythm: Normal rate and regular rhythm.     Heart sounds: S1 normal and S2 normal. No murmur heard.   No friction rub. No S3 or S4 sounds.     Comments: Carotid, radial, femoral, DP and PT pulses normal and equal.    Pulmonary:     Effort: Pulmonary effort is normal.     Breath sounds: Normal breath sounds.  Chest:  Breasts:    Right: No mass, nipple discharge, skin change, tenderness, axillary adenopathy or supraclavicular adenopathy.     Left:  No mass, nipple discharge, skin change, tenderness, axillary adenopathy or supraclavicular adenopathy.  Abdominal:     General: Abdomen is flat. Bowel sounds are normal.     Palpations: Abdomen is soft. There is no hepatomegaly, splenomegaly or mass.     Tenderness: There is no abdominal tenderness.     Hernia: No hernia is present.  Musculoskeletal:  General: Normal range of motion.     Cervical back: Normal range of motion and neck supple.     Comments: Joints without swelling, effusion or erythema.  Lymphadenopathy:     Head:     Right side of head: No submental or submandibular adenopathy.     Left side of head: No submental or submandibular adenopathy.     Cervical: No cervical adenopathy.     Upper Body:     Right upper body: No supraclavicular or axillary adenopathy.     Left upper body: No supraclavicular or axillary adenopathy.     Lower Body: No right inguinal adenopathy. No left inguinal adenopathy.  Skin:    General: Skin is warm.     Capillary Refill: Capillary refill takes less than 2 seconds.     Findings: No rash.     Comments: Skin darkened around eyes, hands, feet--chronic condition.    Neurological:     Mental Status: She is alert and oriented to person, place, and time.     Cranial Nerves: Cranial nerves are intact.     Sensory: Sensation is intact.     Motor: Motor function is intact.     Coordination: Coordination is intact.     Gait: Gait is intact.     Deep Tendon Reflexes: Reflexes are normal and symmetric.  Psychiatric:        Attention and Perception: Attention normal.        Mood and Affect: Mood normal.        Speech: Speech normal.        Behavior: Behavior normal.     Assessment & Plan    CPE without pap or pelvic Will return next available to undergo pelvic exam when not menstruating. Fasting labs in 2 weeks:  FLP, CBC, CMP, A1C.  2.  Anxiety and possibly panic attacks: Referral to Danton Clap, LCSW.    3.  SLE:  seemingly under  control with Hydroxychloroquine and MTX.

## 2021-02-14 ENCOUNTER — Encounter: Payer: Self-pay | Admitting: Internal Medicine

## 2021-02-14 ENCOUNTER — Encounter: Payer: Self-pay | Admitting: Clinical

## 2021-02-14 NOTE — Progress Notes (Signed)
LCSW met with patient as a warm hand off by PCP due to presentation of anxiety on 03/23. Although patient speaks some English she preferred [place]. LCSW introduced self to patient and informed of counseling service. LCSW informed of therapy process including assessment to assess symptoms. Patient shared possible history of anxiety but recently has lasted longer than prior. LCSW discussed cost as patient shared no current insurance nor orange card. LCSW did discuss no show/cancellation apply just as medical visits. Patient accepted referral. Appointment provided for 03/29 9am.

## 2021-02-19 ENCOUNTER — Other Ambulatory Visit: Payer: Self-pay | Admitting: Clinical

## 2021-02-20 ENCOUNTER — Ambulatory Visit: Payer: Self-pay | Admitting: Clinical

## 2021-02-20 ENCOUNTER — Other Ambulatory Visit: Payer: Self-pay

## 2021-02-20 DIAGNOSIS — F419 Anxiety disorder, unspecified: Secondary | ICD-10-CM

## 2021-02-25 NOTE — Progress Notes (Signed)
Integrated Behavioral Health Comprehensive Clinical Assessment  MRN: [id number] Name: Audrey [name]  Session Time:  11:15am-12:15p Total time: 60  Type of Service: Integrated Behavioral Health-Individual Interpretor: Yes.   Interpretor Name and Language: Spanish LCSW is bilingual completed assessment in Spanish.  Presenting problem:  Patient is a 40 y/o Hispanic female who has presented for an assessment following referral from PCP during warm hand off due to anxiety symptoms presented.Therapist completed comprehensive clinical assessment to assess symptoms and led to seeking treatment.  Patient reports she feels emotional. Patient reports "I don't want to take so much medication. I feel stressed. I believe in God to help but this stress I don't know what to do." Patient reports for about 3 weeks she has had headaches went to a specialist and no results were found. They had said it was related to the stress. Patient report experiencing panic symptoms, such as the store, "I felt scared, it was difficult to breathe." Patient reports has experienced this for about a year. Patient reports it doesn't happen often, however lately it has been occurring more intense. Patient reports she does worry for her daughter health to the point she couldn't eat.   Patient reports an episode happened with her husband about 4 months ago, began to experience panic she told him she didn't want to be alone, got dizzy and chills.   Social History:  Who lives in your current household? Patient lives with family- her husband of 21 years, her children 39, 43, and 31.  How do describe your family relationships? Good  What are your social supports? One a week with a group with her church What are your hobbies? Listen to music, go out with my family, do hair do's  Do you have any spiritual beliefs? Goes to church  Education:  What is your highest level of education? 9th grade.  Do you have history of  developmental delays? No If currently in college or university, current major/program?  Employment/Financial Issues:  unemployed Current position:                                 How long have you worked for this employer? 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 no Current Military status (if applicable include dishonorable discharges and the reason):   Legal History Current/Past arrests, charges, incarcerations, etc: NA Current DSS/DHHS involvement, including foster care: NA Current DSS Case Worker name, phone number and email: NA Past DSS/DHHS involvement:  NA   Medical History:   has a past medical history of Lupus (HCC) and Symptomatic anemia (12/03/2018). Primary Care Physician: Julieanne Manson, MD Date of last physical exam:  Allergies: No Known Allergies Current medications:  Outpatient Encounter Medications as of 02/20/2021  Medication Sig  . calcium-vitamin D (OSCAL WITH D) 500-200 MG-UNIT tablet Take 2 tablets by mouth daily with breakfast.  . ferrous gluconate (FERGON) 324 MG tablet Take 1 tablet (324 mg total) by mouth 2 (two) times daily with a meal.  . folic acid (FOLVITE) 1 MG tablet Take 1 mg by mouth daily.  . hydroxychloroquine (PLAQUENIL) 200 MG tablet 2 tabs by mouth once daily  . ibuprofen (ADVIL,MOTRIN) 200 MG tablet Take 400 mg  by mouth every 6 (six) hours as needed for fever.   Marland Kitchen levonorgestrel (MIRENA) 20 MCG/24HR IUD 1 each by Intrauterine route once.  . methotrexate (RHEUMATREX) 2.5 MG tablet Take 2.5 mg by mouth once a week. Take 8 tablets by mouth once a week   No facility-administered encounter medications on file as of 02/20/2021.   Have you ever had any serious medication reactions? No   Pt reports with her lupus she hasn't had no pain more it is nerves.   Psychiatric History (mental health or substance  abuse)  Have you ever been treated for a mental health/substance use problem? No If "Yes", when were you treated and whom did you see? -- Dates from-Date to:  Provider: Treatment Type: Outcome/Follow Up:  Family History: Is there any history of mental health problems or substance abuse in your family? Yes- Alcohol use her father and brother  Has anyone in your family been hospitalized for mental health treatment? No   Mental Status:  General appearance/Behavior: Casual Eye contact: Good Motor behavior: Normal Speech: Normal Level of consciousness: Alert Mood: Anxious Affect: Appropriate Thought process: Coherent Thought content: WNL Perception: Normal Judgment: Good Insight: Present Intellect:  WNL Memory: Within Normal limits Orientation: Full Orientated  Attention/Concentration Adequate  Comments: It should be noted during a section in the assessment patient began to display crying spells. Patient reports it wasn't related to the part of the section of the assessment but this has been occurring randomly. Patient reports she feels emotional lately.   Sleep Usual bedtime is 10pm Sleeping arrangements: NA Problems with snoring: No Obstructive sleep apnea is not a concern. Problems with nightmares: No Problems with sleepwalking: No   Patient reports for about a week there was 3 days where she couldn't sleep well. It was difficult to stay asleep.   Trauma History: Have you ever experienced or been exposed to any form of abuse?  Emotional? No Physical? No Sexual/assault? No Neglect? Pt reports she didn't have enough food and things growing up.  Have you ever witnessed/been exposed to something traumatic? No  Do you have any current symptoms? Negative  Substance Abuse:  Do you use alcohol, nicotine or caffeine? NA Have you ever used illicit drugs or abused prescription medications? NA If yes? Substance Type-  Route-  Age of first use?  Amount of  Use? Frequency? Last use?  Do you have any problems with the following symptoms? NA Reason for use, any motivation to stop, what is stopping from use ? NA  Risk Assessment: Current danger to self Thoughts of suicide/death:  Self-harming behaviors:    Suicide attempt:  Has plan:    Comments/clarify:  Pt denied any active SI     Past danger to self Thoughts of suicide/death:  Self-harming behaviors:    Suicide attempt:  Family history of suicide:    Comments/clarify: Pt denied any active HI     Current danger to others Thoughts to harm others:  Plans to harm others:    Threats to harm others:  Attempt to harm others:    Comments/clarify: Pt denied any hx of SI     Past danger to others Thoughts to harm others:  Plans to harm others:    Threats to harm others:  Attempt to harm others:    Comments/clarify: Pt denied any hx of HI    RISK TO SELF Low to no risk: x Moderate risk:  Severe risk:   RISK TO OTHERS Low to no risk: x Moderate  risk:  Severe risk:     Do you have any protective factors that keep you from attempting? responsibility to others (children, family) and religious beliefs against suicide  Psychosocial strengths and stressors: Relationship support/concerns/needs: NA Financial concerns/needs:  NA Financial resources (other sources of income, not from job):  NA Housing concerns/needs:  NA  Diagnosis   ICD-10-CM   1. Anxiety disorder, unspecified type  F41.9 presentations in which symptoms characteristic of a anxiety and related disorder that cause clinically significant distress or impairment in social, occupational, or other important areas of functioning predominate but do not meet full criteria for any of the disorders in the anxiety and related disorders diagnostic class.   R/o Panic Disorder vs GAD w/panic  Patient outcome?  What do you want out of treatment?  Pt reports, "help me control these symptoms."   GOALS ADDRESSED: Patient will reduce symptoms of:  anxiety and increase knowledge and/or ability of: coping skills and self-management skills.             INTERVENTIONS: Standardized Assessments completed: GAD-7 and PHQ 9. PHQ9 (11) GAD7(8)  PLAN: Patient is a 40 year old Hispanic female who has been referred by primary care provider due to anxiety symptoms reported during her visit. Patient notes episodes of panic attacks lately due to stress along aside of her lupus. Patient reports she has experienced these panic attacks for about a year but lately has increased. Based on presenting symptoms and symptoms reported during PCP, GAD, and PHQ9 results patient meets critieria for:  Anxiety disorder, unspecified type  F41.9 AEB presentations in which symptoms characteristic of a anxiety and related disorder that cause clinically significant distress or impairment in social, occupational, or other important areas of functioning predominate but do not meet full criteria for any of the disorders in the anxiety and related disorders diagnostic class.  With a rule out of Panic Disorder vs GAD w/panic. LCSW recommends ongoing individual therapy sessions. LCSW reviewed crisis plan flyer. Patient denied active SI/HI   Scheduled next visit: 04/07  Audrey [name] Clinical Social Work

## 2021-02-26 ENCOUNTER — Telehealth: Payer: Self-pay | Admitting: Clinical

## 2021-02-26 NOTE — Telephone Encounter (Signed)
LCSW contacted pt to reschedule appt as she contacted wanting to reschedule. LCSW left VM

## 2021-03-04 ENCOUNTER — Other Ambulatory Visit (INDEPENDENT_AMBULATORY_CARE_PROVIDER_SITE_OTHER): Payer: Self-pay | Admitting: Internal Medicine

## 2021-03-04 ENCOUNTER — Other Ambulatory Visit: Payer: Self-pay

## 2021-03-04 DIAGNOSIS — Z87898 Personal history of other specified conditions: Secondary | ICD-10-CM

## 2021-03-04 DIAGNOSIS — Z1159 Encounter for screening for other viral diseases: Secondary | ICD-10-CM

## 2021-03-04 DIAGNOSIS — Z9229 Personal history of other drug therapy: Secondary | ICD-10-CM

## 2021-03-04 DIAGNOSIS — E785 Hyperlipidemia, unspecified: Secondary | ICD-10-CM

## 2021-03-04 DIAGNOSIS — D509 Iron deficiency anemia, unspecified: Secondary | ICD-10-CM

## 2021-03-05 ENCOUNTER — Ambulatory Visit: Payer: Self-pay | Admitting: Clinical

## 2021-03-05 DIAGNOSIS — F419 Anxiety disorder, unspecified: Secondary | ICD-10-CM

## 2021-03-05 LAB — CBC WITH DIFFERENTIAL/PLATELET
Basophils Absolute: 0 10*3/uL (ref 0.0–0.2)
Basos: 0 %
EOS (ABSOLUTE): 0 10*3/uL (ref 0.0–0.4)
Eos: 0 %
Hematocrit: 39.3 % (ref 34.0–46.6)
Hemoglobin: 13.7 g/dL (ref 11.1–15.9)
Immature Grans (Abs): 0 10*3/uL (ref 0.0–0.1)
Immature Granulocytes: 0 %
Lymphocytes Absolute: 1.4 10*3/uL (ref 0.7–3.1)
Lymphs: 28 %
MCH: 29.6 pg (ref 26.6–33.0)
MCHC: 34.9 g/dL (ref 31.5–35.7)
MCV: 85 fL (ref 79–97)
Monocytes Absolute: 0.3 10*3/uL (ref 0.1–0.9)
Monocytes: 5 %
Neutrophils Absolute: 3.2 10*3/uL (ref 1.4–7.0)
Neutrophils: 67 %
Platelets: 201 10*3/uL (ref 150–450)
RBC: 4.63 x10E6/uL (ref 3.77–5.28)
RDW: 12.8 % (ref 11.7–15.4)
WBC: 4.9 10*3/uL (ref 3.4–10.8)

## 2021-03-05 LAB — COMPREHENSIVE METABOLIC PANEL
ALT: 17 IU/L (ref 0–32)
AST: 21 IU/L (ref 0–40)
Albumin/Globulin Ratio: 1.5 (ref 1.2–2.2)
Albumin: 4.7 g/dL (ref 3.8–4.8)
Alkaline Phosphatase: 95 IU/L (ref 44–121)
BUN/Creatinine Ratio: 13 (ref 9–23)
BUN: 10 mg/dL (ref 6–20)
Bilirubin Total: 0.4 mg/dL (ref 0.0–1.2)
CO2: 18 mmol/L — ABNORMAL LOW (ref 20–29)
Calcium: 9 mg/dL (ref 8.7–10.2)
Chloride: 104 mmol/L (ref 96–106)
Creatinine, Ser: 0.75 mg/dL (ref 0.57–1.00)
Globulin, Total: 3.2 g/dL (ref 1.5–4.5)
Glucose: 87 mg/dL (ref 65–99)
Potassium: 4.4 mmol/L (ref 3.5–5.2)
Sodium: 138 mmol/L (ref 134–144)
Total Protein: 7.9 g/dL (ref 6.0–8.5)
eGFR: 104 mL/min/{1.73_m2} (ref >59)

## 2021-03-05 LAB — HEPATITIS C ANTIBODY: Hep C Virus Ab: <0.1 s/co ratio (ref 0.0–0.9)

## 2021-03-05 LAB — HGB A1C W/O EAG: Hgb A1c MFr Bld: 5.2 % (ref 4.8–5.6)

## 2021-03-05 LAB — LIPID PANEL W/O CHOL/HDL RATIO
Cholesterol, Total: 148 mg/dL (ref 100–199)
HDL: 40 mg/dL (ref >39)
LDL Chol Calc (NIH): 87 mg/dL (ref 0–99)
Triglycerides: 117 mg/dL (ref 0–149)
VLDL Cholesterol Cal: 21 mg/dL (ref 5–40)

## 2021-03-11 NOTE — Progress Notes (Signed)
   THERAPY PROGRESS NOTE  Session Time: 9-10 Participation Level: Active Behavioral Response: CasualAlertEuthymic Type of Therapy: Individual Therapy Treatment Goals addressed: Learn and implement coping skills that result in a reduction of anxiety and worry, and improved daily functioning.   Purpose: LCSW met with patient for first routine individual therapy to work towards treatment goals.   Intervention: LCSW met with patient for first routine individual session to work towards treatment goals. LCSW provided patient opportunity to check in to assess how she was feeling today and since initial assessment. LCSW introduced patient to therapeutic process and discussed expectations. LCSW provided patient agenda of session to be introduction to anxiety to review symptoms she presented in check in. LCSW utilized this session to normalize therapy and symptoms to help patient become comfortable with therapy process. LCSW assessed for SI/HI/command psychosis.  Effectiveness: Patient is alert x4 affect. Patient reports today she is feeling good and better. Patient shared in check in updates of her daughters recent doctor visits and feeling better knowing a response. Patient reports noticing her daughter have some worry and some anxiety present and also seeking therapy for her. LCSW provided appointment for her daughter. Patient reports that she recognizes no recent panic attacks however she has been having dizziness sensations and as if ears were closed off (like water in them) which is something new. Patient reports when she recognizes the dizziness she try to calm herself, usually along with her head feeling dizzy, reports this goes away after 10-15 minutes about twice a day. Patient reports when she has anxiety/panic she goes lay down to let it pass. Patient notes the dizziness also occurs when she drives far distances. Patient reports this is the reason she likes to stay close by. Intervention was  effective as patient appears to become comfortable with therapy process and expectations of it. Patient expressed understanding to inform LCSW of any new symptoms and if she has concern of therapy. Progress towards goal is Ongoing. Patient denied active suicidal/homicidal/active psychosis.  Plan Patient offered next appointment for: 03/19/21 9am  Diagnosis: unspecified anxiety disorder.    Lujean Rave, LCSW 03/11/2021

## 2021-03-19 ENCOUNTER — Emergency Department (HOSPITAL_COMMUNITY)
Admission: EM | Admit: 2021-03-19 | Discharge: 2021-03-20 | Disposition: A | Discharge status: Home / Self Care | Payer: Self-pay | Attending: Emergency Medicine | Admitting: Emergency Medicine

## 2021-03-19 ENCOUNTER — Emergency Department (HOSPITAL_COMMUNITY): Payer: Self-pay

## 2021-03-19 ENCOUNTER — Other Ambulatory Visit: Payer: Self-pay

## 2021-03-19 ENCOUNTER — Encounter (HOSPITAL_COMMUNITY): Payer: Self-pay

## 2021-03-19 ENCOUNTER — Ambulatory Visit: Payer: Self-pay | Admitting: Clinical

## 2021-03-19 DIAGNOSIS — G43809 Other migraine, not intractable, without status migrainosus: Secondary | ICD-10-CM

## 2021-03-19 DIAGNOSIS — R200 Anesthesia of skin: Secondary | ICD-10-CM

## 2021-03-19 DIAGNOSIS — F419 Anxiety disorder, unspecified: Secondary | ICD-10-CM

## 2021-03-19 DIAGNOSIS — R531 Weakness: Secondary | ICD-10-CM | POA: Insufficient documentation

## 2021-03-19 LAB — BASIC METABOLIC PANEL
Anion gap: 5 (ref 5–15)
BUN: 6 mg/dL (ref 6–20)
CO2: 23 mmol/L (ref 22–32)
Calcium: 8.5 mg/dL — ABNORMAL LOW (ref 8.9–10.3)
Chloride: 108 mmol/L (ref 98–111)
Creatinine, Ser: 0.64 mg/dL (ref 0.44–1.00)
GFR, Estimated: >60 mL/min (ref >60)
Glucose, Bld: 99 mg/dL (ref 70–99)
Potassium: 3.5 mmol/L (ref 3.5–5.1)
Sodium: 136 mmol/L (ref 135–145)

## 2021-03-19 LAB — CBC
HCT: 34.9 % — ABNORMAL LOW (ref 36.0–46.0)
Hemoglobin: 11.8 g/dL — ABNORMAL LOW (ref 12.0–15.0)
MCH: 29 pg (ref 26.0–34.0)
MCHC: 33.8 g/dL (ref 30.0–36.0)
MCV: 85.7 fL (ref 80.0–100.0)
Platelets: 203 10*3/uL (ref 150–400)
RBC: 4.07 MIL/uL (ref 3.87–5.11)
RDW: 12.5 % (ref 11.5–15.5)
WBC: 5.8 10*3/uL (ref 4.0–10.5)
nRBC: 0 % (ref 0.0–0.2)

## 2021-03-19 LAB — TROPONIN I (HIGH SENSITIVITY): Troponin I (High Sensitivity): <2 ng/L (ref <18)

## 2021-03-19 LAB — I-STAT BETA HCG BLOOD, ED (MC, WL, AP ONLY): I-stat hCG, quantitative: 8.1 m[IU]/mL — ABNORMAL HIGH (ref <5)

## 2021-03-19 MED ORDER — SODIUM CHLORIDE 0.9 % IV BOLUS
1000.0000 mL | Freq: Once | INTRAVENOUS | Status: AC
  Administered 2021-03-19: 1000 mL via INTRAVENOUS

## 2021-03-19 MED ORDER — DIPHENHYDRAMINE HCL 50 MG/ML IJ SOLN
25.0000 mg | Freq: Once | INTRAMUSCULAR | Status: AC
  Administered 2021-03-19: 25 mg via INTRAVENOUS
  Filled 2021-03-19: qty 1

## 2021-03-19 MED ORDER — PROCHLORPERAZINE EDISYLATE 10 MG/2ML IJ SOLN
10.0000 mg | Freq: Once | INTRAMUSCULAR | Status: AC
  Administered 2021-03-19: 10 mg via INTRAVENOUS
  Filled 2021-03-19: qty 2

## 2021-03-19 NOTE — ED Triage Notes (Signed)
Engineer, structural used. Pt c/o pressure in chest, left arm is numb and face numbness, left neck pain. Pt c/o dizziness. Pain started a few days ago. Symptoms worsened today. Pt has taken simethicone w/some relief.

## 2021-03-20 ENCOUNTER — Emergency Department (HOSPITAL_COMMUNITY): Payer: Self-pay

## 2021-03-20 MED ORDER — GADOBUTROL 1 MMOL/ML IV SOLN
6.5000 mL | Freq: Once | INTRAVENOUS | Status: AC | PRN
  Administered 2021-03-20: 6.5 mL via INTRAVENOUS

## 2021-03-20 MED ORDER — IOHEXOL 350 MG/ML SOLN
75.0000 mL | Freq: Once | INTRAVENOUS | Status: AC | PRN
  Administered 2021-03-19: 75 mL via INTRAVENOUS

## 2021-03-20 NOTE — Discharge Instructions (Addendum)
You were evaluated in the Emergency Department and after careful evaluation, we did not find any emergent condition requiring admission or further testing in the hospital.  Your exam/testing today was overall reassuring.  Symptoms are likely due to a complex migraine.  Please follow-up with the neurologists.  As we discussed, your MRI showed a small area of abnormal bone marrow.  I have messaged your primary care doctor, Dr. Delrae Alfred who will be able to help you with any future management needed.  Please return to the Emergency Department if you experience any worsening of your condition.  Thank you for allowing [name] to be a part of your care.

## 2021-03-20 NOTE — ED Provider Notes (Signed)
[facility] Emergency Department Provider Note MRN:  [id number]  Arrival date & time: 03/20/21     Chief Complaint   Weakness History of Present Illness   Audrey [name] is a 40 y.o. year-old female with a history of lupus presenting to the ED with chief complaint of weakness.  Intermittent dizziness for the past 3 weeks.  Today with headache and left-sided neck pain, also experiencing some left-sided numbness or weakness to the left arm and left leg.  Symptoms began at 7 PM.  Symptoms constant.  Endorsing some chest discomfort as well.  No abdominal pain.  No fever, no cough.  Symptoms mild to moderate.  Review of Systems  A complete 10 system review of systems was obtained and all systems are negative except as noted in the HPI and PMH.   Patient's Health History    Past Medical History:  Diagnosis Date  . Lupus (HCC)   . Symptomatic anemia 12/03/2018    Past Surgical History:  Procedure Laterality Date  . DILATION AND CURETTAGE OF UTERUS  2007   following SAB  . LAPAROSCOPIC CHOLECYSTECTOMY  2009    Family History  Problem Relation Age of Onset  . Cancer Mother        Esophageal.  . Diabetes Mother   . Hypertension Father   . Irritable bowel syndrome Sister   . Hyperlipidemia Son     Social History   Socioeconomic History  . Marital status: Married    Spouse name: Clyda Hurdle  . Number of children: 3  . Years of education: 9  . Highest education level: Not on file  Occupational History  . Occupation: Housewife  Tobacco Use  . Smoking status: Never Smoker  . Smokeless tobacco: Never Used  Vaping Use  . Vaping Use: Never used  Substance and Sexual Activity  . Alcohol use: No  . Drug use: Never  . Sexual activity: Yes    Birth control/protection: I.U.D.  Other Topics Concern  . Not on file  Social History Narrative   Lives with husband and 3 children kids.   Social Determinants of Health   Financial Resource  Strain: Low Risk   . Difficulty of Paying Living Expenses: Not hard at all  Food Insecurity: No Food Insecurity  . Worried About Programme researcher, broadcasting/film/video in the Last Year: Never true  . Ran Out of Food in the Last Year: Never true  Transportation Needs: No Transportation Needs  . Lack of Transportation (Medical): No  . Lack of Transportation (Non-Medical): No  Physical Activity: Not on file  Stress: Not on file  Social Connections: Not on file  Intimate Partner Violence: Not At Risk  . Fear of Current or Ex-Partner: No  . Emotionally Abused: No  . Physically Abused: No  . Sexually Abused: No     Physical Exam   Vitals:   03/20/21 0626 03/20/21 0630  BP: 111/63 105/65  Pulse: 74 73  Resp: 19 18  Temp:    SpO2: 97% 98%    CONSTITUTIONAL: Well-appearing, NAD NEURO:  Alert and oriented x 3, subjective decreased sensation to left arm and left leg.  Subtle left pronator drift to left arm.  Normal visual acuity, no field cuts, no aphasia, no neglect EYES:  eyes equal and reactive ENT/NECK:  no LAD, no JVD CARDIO: Regular rate, well-perfused, normal S1 and S2 PULM:  CTAB no wheezing or rhonchi GI/GU:  normal bowel sounds, non-distended, non-tender MSK/SPINE:  No gross deformities, no  edema SKIN:  no rash, atraumatic PSYCH:  Appropriate speech and behavior  *Additional and/or pertinent findings included in MDM below  Diagnostic and Interventional Summary    EKG Interpretation  Date/Time:  Tuesday March 19 2021 22:05:51 EDT Ventricular Rate:  72 PR Interval:  140 QRS Duration: 74 QT Interval:  420 QTC Calculation: 459 R Axis:   77 Text Interpretation: Normal sinus rhythm Normal ECG No STEMI Confirmed by Alvester Chou [phone]) on 03/19/2021 11:08:50 PM      Labs Reviewed  BASIC METABOLIC PANEL - Abnormal; Notable for the following components:      Result Value   Calcium 8.5 (*)    All other components within normal limits  CBC - Abnormal; Notable for the following  components:   Hemoglobin 11.8 (*)    HCT 34.9 (*)    All other components within normal limits  I-STAT BETA HCG BLOOD, ED ([facility], [facility], [facility] ONLY) - Abnormal; Notable for the following components:   I-stat hCG, quantitative 8.1 (*)    All other components within normal limits  TROPONIN I (HIGH SENSITIVITY)  TROPONIN I (HIGH SENSITIVITY)    MR Brain W and Wo Contrast  Final Result    MR CERVICAL SPINE W WO CONTRAST  Final Result    CT Angio Head W or Wo Contrast  Final Result    CT Angio Neck W and/or Wo Contrast  Final Result    DG Chest 2 View  Final Result      Medications  diphenhydrAMINE (BENADRYL) injection 25 mg (25 mg Intravenous Given 03/19/21 2346)  prochlorperazine (COMPAZINE) injection 10 mg (10 mg Intravenous Given 03/19/21 2347)  sodium chloride 0.9 % bolus 1,000 mL (0 mLs Intravenous Stopped 03/20/21 0137)  iohexol (OMNIPAQUE) 350 MG/ML injection 75 mL (75 mLs Intravenous Contrast Given 03/19/21 2359)  gadobutrol (GADAVIST) 1 MMOL/ML injection 6.5 mL (6.5 mLs Intravenous Contrast Given 03/20/21 0553)     Procedures  /  Critical Care Procedures  ED Course and Medical Decision Making  I have reviewed the triage vital signs, the nursing notes, and pertinent available records from the EMR.  Listed above are laboratory and imaging tests that I personally ordered, reviewed, and interpreted and then considered in my medical decision making (see below for details).  Concern for acute ischemic stroke, also considering complex migraine, carotid dissection.  Onset of symptoms 4 and half hours ago, just outside of any tPA window and so code stroke initiation not indicated.  Still this presentation discussed with Dr. Derry Lory of neurology, who agrees with CTA imaging at this time followed by MRI.     CT and MRI imaging are reassuring.  Patient's symptoms are resolved.  Discussed incidental finding with patient, namely the marrow abnormality in the T1 vertebra.  Will follow-up with  PCP.  Elmer Sow. Pilar Plate, MD [facility] Emergency Medicine [facility] [email or web address]  Final Clinical Impressions(s) / ED Diagnoses     ICD-10-CM   1. Other migraine without status migrainosus, not intractable  G43.809   2. Left sided numbness  R20.0     ED Discharge Orders         Ordered    Ambulatory referral to Neurology       Comments: An appointment is requested in approximately: 4 weeks   03/20/21 0656           Discharge Instructions Discussed with and Provided to Patient:     Discharge Instructions     You were evaluated in  the Emergency Department and after careful evaluation, we did not find any emergent condition requiring admission or further testing in the hospital.  Your exam/testing today was overall reassuring.  Symptoms are likely due to a complex migraine.  Please follow-up with the neurologists.  As we discussed, your MRI showed a small area of abnormal bone marrow.  I have messaged your primary care doctor, Dr. Delrae Alfred who will be able to help you with any future management needed.  Please return to the Emergency Department if you experience any worsening of your condition.  Thank you for allowing [name] to be a part of your care.        Sabas Sous, MD 03/20/21 0700

## 2021-03-26 NOTE — Progress Notes (Signed)
   THERAPY PROGRESS NOTE  Session Time: 9-10 Participation Level: Active Behavioral Response: CasualAlertEuthymic Type of Therapy: Individual Therapy Treatment Goals addressed: Learn and implement coping skills that result in a reduction of anxiety and worry, and improved daily functioning.   Purpose: LCSW met with client for routine individual therapy to work towards treatment goals: Learn and implement coping skills that result in a reduction of anxiety and worry, and improved daily functioning.  Intervention: LCSW met with patient for routine individual therapy to work towards treatment goals. LCSW provided patient opportunity to check in to assess for any significant events and how she is doing today. LCSW utilized intervention of CBT by identifying her anxiety symptoms and how it affects her mood. LCSW encouraged patient to track her symptoms and how long they are occurring to inform provider as patient noted gets anxious and begins to lose focus. This will also help to determine her symptoms are related to a health condition. LCSW also provided patient CBT coping skills of mental grounding techniques such as using affirmations "I am safe I am here" and categories. LCSW assessed for SI/HI. LCSW asked patient to summarize her take away from the session.  Effectiveness: Patient is alert x4 affect. Patient identified she is doing better today. Reports she went with her family to the park. Patient feeling anxiety is somewhat stable knowing her daughter is feeling better, however did experience palpations on Sunday before cooking. Patient reports she has continued to felt dizziness, her sight was affected. Patient notes awareness it's not her sight due to recent eye visit and is being cautious if it is something medical. LCSW encouraged her to keep tracking her symptoms, how often, where and to write it down to be able to follow it and notify her PCP. Patient notes she likes this idea because she  recognizes when she gets nervous she loses her concentration. Patient was receptive to the anxiety skills. Patient shared trying mindfulness exercise of being present helpful from previous session when she felt her palpations or any worry. Patient practiced in session categories by naming different flowers. Patient reports she will practice the mental grounding statements. Intervention was effective as patient was able to identify her symptoms, and acknowledge coping skills. Progress towards goal is Ongoing. Patient denied active suicidal/homicidal/active psychosis.  Plan Patient offered next appointment for: 04/02/21 9am  Diagnosis: Unspecified Anxiety Disorder    Lujean Rave, LCSW 03/26/2021

## 2021-04-02 ENCOUNTER — Ambulatory Visit: Payer: Self-pay | Admitting: Clinical

## 2021-04-02 ENCOUNTER — Other Ambulatory Visit: Payer: Self-pay

## 2021-04-02 DIAGNOSIS — F419 Anxiety disorder, unspecified: Secondary | ICD-10-CM

## 2021-04-03 ENCOUNTER — Ambulatory Visit: Payer: Self-pay | Admitting: Internal Medicine

## 2021-04-03 ENCOUNTER — Encounter: Payer: Self-pay | Admitting: Internal Medicine

## 2021-04-03 VITALS — BP 120/70 | HR 84 | Resp 12 | Ht 59.5 in | Wt 131.0 lb

## 2021-04-03 DIAGNOSIS — G43119 Migraine with aura, intractable, without status migrainosus: Secondary | ICD-10-CM

## 2021-04-03 DIAGNOSIS — Z01419 Encounter for gynecological examination (general) (routine) without abnormal findings: Secondary | ICD-10-CM

## 2021-04-03 DIAGNOSIS — M329 Systemic lupus erythematosus, unspecified: Secondary | ICD-10-CM

## 2021-04-03 MED ORDER — TOPIRAMATE 25 MG PO TABS: ORAL_TABLET | ORAL | 1 refills | Status: DC

## 2021-04-03 NOTE — Progress Notes (Signed)
Subjective:    Patient ID: Audrey [name], female   DOB: [birth year], 40 y.o.   MRN: [id number]   HPI   Interpreted by Arlana Lindau  1.  Completion of CPE with pelvic exam.  Had pap 01/2020, so not necessary today.  She did not want pelvic performed with her CPE as menstruating.  Her period started again yesterday.  2.  Migraines:  Was seen in ED on 03/19/21 for what appears to have been another severe migraine with more significant neurologic symptoms than previously.   See visit from 09/20/20 with what was historically her first episode of migraine with normal CT of brain then.   She describes the headaches as on the left each time and coming on suddenly.  She has now had a CT scan of brain, CT angio of brain and neck and MR with and without contrast of brain and cervical spine that were all normal.  No inflammatory changes noted with CT Angio or MR 03/19/21.   She did have left sided numbness and tingling of left arm and leg with this last more severe episode end of April, which she has not had before. She states today that she has had less severe, but similar in character headaches on and off since I saw her end of October of 2021 until this more severe episode in April.   Since her visit to ED 03/19/21, she has had symptoms every other day.  She states her symptoms start with her left ear plugging, then her left nostril feels congested followed by the left sided headache.  She often has a sense of loss of balance with the plugging sensation to ear and nostril, which precedes the actual headache by generally about 1/2 hour, + photophobia, and nausea with at times vomiting after the headache starts.  Also nausea with the dizziness/loss of balance sensation.  Her headache symptoms have not been as severe since her visit to ED on 4/26.   Feels better if lies down and even more so if falls asleep for a bit.   Regarding her SLE, she feels her skin rash and joint changes/pain have been well  controlled with Hydroxychloroquine and MTX.    3.  T1 Hypointense appearance diffusely of bone marrow with MR. She is a bit anemic as compared to 03/04/2021 when labs done previously.  Not clear if SLE or MTX could also possibly cause a similar appearance.   Current Meds  Medication Sig   calcium-vitamin D (OSCAL WITH D) 500-200 MG-UNIT tablet Take 2 tablets by mouth daily with breakfast.   ferrous gluconate (FERGON) 324 MG tablet Take 1 tablet (324 mg total) by mouth 2 (two) times daily with a meal.   folic acid (FOLVITE) 1 MG tablet Take 1 mg by mouth daily.   hydroxychloroquine (PLAQUENIL) 200 MG tablet 2 tabs by mouth once daily   ibuprofen (ADVIL,MOTRIN) 200 MG tablet Take 400 mg by mouth every 6 (six) hours as needed for fever.    levonorgestrel (MIRENA) 20 MCG/24HR IUD 1 each by Intrauterine route once.   methotrexate (RHEUMATREX) 2.5 MG tablet Take 2.5 mg by mouth once a week. Take 8 tablets by mouth once a week   No Known Allergies   Review of Systems    Objective:   BP 120/70 (BP Location: Left Arm, Patient Position: Sitting, Cuff Size: Normal)   Pulse 84   Resp 12   Ht 4' 11.5" (1.511 m)   Wt 131 lb (59.4 kg)  LMP 04/02/2021   BMI 26.02 kg/m   Physical Exam NAD HEENT:   PERRL, EOMI, TMs pearly gray, throat without injection Neck:  Supple, No adenopathy Chest:  CTA CV:  RRR without murmur or rub.  Radial pulses normal and equal GU:  normal external female genitalia.  No uterine or adnexal mass or tenderness Neuro:  A & O x 3, CN II-XII grossly intact.  DTRs 1+/4 throughout.  Motor 5/5 throughout.  Finger to nose to finger, rapid alternating motions, heel to shin and gait all normal.  Assessment & Plan   1.  Normal pelvic exam to complete CPE  2.  Migraines, becoming chronic:  Though no Raynaud's, she always has cold hands and feet, so will avoid beta blockade.  Will start with Topiramate 25 mg and titrate weekly by 25 mg to a max of 100 mg.  To call if any  side effects.   MR with contrast of brain and cervical spine did not suggest inflammation/cerebritis, but check sed rate and CRP

## 2021-04-05 LAB — C-REACTIVE PROTEIN: CRP: 4 mg/L (ref 0–10)

## 2021-04-05 LAB — SEDIMENTATION RATE: Sed Rate: 15 mm/hr (ref 0–32)

## 2021-04-11 NOTE — Progress Notes (Signed)
   THERAPY PROGRESS NOTE  Session Time: 9-10am Participation Level: Active Behavioral Response: CasualAlertAnxious Type of Therapy: Individual Therapy Treatment Goals addressed: Learn and implement coping skills that result in a reduction of anxiety and worry, and improved daily functioning.   Purpose: LCSW met with client for routine individual therapy to work towards treatment goals:Learn and implement coping skills that result in a reduction of anxiety and worry, and improved daily functioning.  Intervention: LCSW met with patient for routine individual therapy to continue to work towards goals. LCSW provided patient opportunity to check in to assess for any significant events and assess how she is doing today. LCSW utilized intervention of CBT coping skills of deep breathing along with EFT tapping to decrease anxiety/depressive symptoms. LCSW provided brief education of DBT skill of ACCEPT- especially the acronym compare- as patient shared what has helped coping with her pain is knowing the level of it is way less than her lupus (see effectiveness for further process of patient) LCSW and patient practiced breathing exercises (4-7-8, and up/down from [facility]) along with EFT tapping with affirmation. Discussed how able to combine skills with the exercises. LCSW assessed for SI/HI/command psychosis.  Effectiveness: Patient is alert x4 affect. Patient reports today she is tired, still hurting from her face. Patient reports due to fear of something happening on the road due to dizziness her husband transported her and will wait for her until finish. Patient reports the ongoing anxiety of what it may be and dizziness that could happen anywhere. Patient reports what she has been doing to cope with her pain is when she begins to feel symptoms she goes lay down and rest for it to pass. Patient reports she has been able to cope with trying to view her pain by comparing to her lupus, shares it motivates her to  hope to feel better and recover. Patient reports ongoing anxiety due to these health concerns. Patient participated in the breathing exercises of EDMR up/down, 4,5,8 to be able to utilize for tomorrow's dr appointment. Discussed to listen to her body, and advocate on her behalf when asked questions by PCP. Patient participated on the tapping EFT, patient identified affirmation of accept, "I feel pain and I want to feel better" Patient practiced this about 2 times in session. Patient shared at end of session found EFT effective and will continue to practice. LCSW provided patient handout of tapping points to practice at home.  Intervention was effective as patient was able to reflect, process her emotions and practice exercises in session. Progress towards goal is Ongoing. Patient denied active suicidal/homicidal/active psychosis.  Plan Patient offered next appointment for:  05/24 9am Diagnosis: unspecified anxiety disorder r/o other anxiety disorder related to medical condition.     Lujean Rave, LCSW 04/11/2021

## 2021-04-16 ENCOUNTER — Telehealth: Payer: Self-pay | Admitting: Clinical

## 2021-04-16 ENCOUNTER — Ambulatory Visit: Payer: Self-pay | Admitting: Clinical

## 2021-04-16 ENCOUNTER — Other Ambulatory Visit: Payer: Self-pay

## 2021-04-16 DIAGNOSIS — F419 Anxiety disorder, unspecified: Secondary | ICD-10-CM

## 2021-04-16 NOTE — Progress Notes (Addendum)
   THERAPY PROGRESS NOTE  Session Time: 9-10 Participation Level: Active Behavioral Response: CasualAlertAnxious and Depressed Type of Therapy: Individual Therapy Treatment Goals addressed:  Learn and implement coping skills that result in a reduction of anxiety and worry, and improved daily functioning.   Purpose: LCSW met with client for routine individual therapy to work towards treatment goals:  Learn and implement coping skills that result in a reduction of anxiety and worry, and improved daily functioning.  Intervention: LCSW met with patient for routine individual therapy to continue to work twards treatment goals. LCSW provided patient opportunity to check in to assess for any significant events and how she is doing today. LCSW utilized intervention of DBT skills to decrease anxiety/depressive symptoms. LCSW provided patient technique of building steps to motivate self to cope with her depressive symptoms. LCSW utilized handout IMPROVE the moment of DBT skills as another technique to cope with her sensations and any anxiety that presents with it. Discussed in session with anxiety, problem solving can be help to cope with her worries, discussed advocating for herself by listening to her body and informing her providers of her concerns. LCSW assessed for SI/HI/command psychosis. Prior to ending session LCSW complete telephone note to PCP to inform of current concerns.   Effectiveness: Patient is alert x4 low affect. Patient reports "somewhat" for mood. Patient reports she woke this morning with numbness from elbow to her hands. Patient reports she finds herself touching her face more often because of sensations of numbness. Patient reports she is becoming desparada (desperate)/or restless with not knowing what it could be. Patient reports her lupus provider said it was possibility related to her anxiety. Patient processed in session she is capable of recognizing her anxiety symptoms and this is  something different.  Patient recognizes in session her symptoms is affecting her mood by not wanting to go out and no motivation. Patient reports example yesterday her husband and children went to buy groceries and she stayed home, where she feels safe and can cope with her feelings.It should be noted patient did display crying tears in session as she processed what she is feeling.   Patient reports she has been coping with her symptoms by expressing and talking to her husband of what she feels, praying to God. Patient expressed understanding another way to cope with her physical symptoms by relaxing and speaking on behalf of her body.  Patient expressed understanding how she motivated herself this morning despite the symptoms she was feeling. Patient agreed to contact her PCP with assistance of LCSW to inform of symptoms increase. Patient reports completing phone call note felt a sense of relief. Patient noted neurology called to make an appointment but she did not make any decisions as she wasn't certain if she had to do this on her own account or referred by PCP. (did note this in telephone call)   Intervention was effective as patient was able to process her feelings. Progress towards goal is Ongoing. Patient denied active suicidal/homicidal/active psychosis.  Plan Patient offered next appointment for: 06/07 9am  Diagnosis: Unspecified Anxiety Disorder    Lujean Rave, LCSW 04/16/2021

## 2021-04-17 NOTE — Telephone Encounter (Signed)
Please call and find out how many topiramate tabs she is taking at night at this time. Has she noted any improvement at all yet? I can certainly refer her to Neurology, but she will likely have to pay out of pocket as [employer] card does not cover.

## 2021-04-17 NOTE — Telephone Encounter (Signed)
Patient reported that she is on her 3rd week, so she is taking 3 pills at night. She started with one pill the first week and on her second week she took 2 pill every night. Patient stated she feels like it has improved a little bit.   Patient is interested in neurology, she would like to know how much neurology would be.

## 2021-04-25 NOTE — Telephone Encounter (Signed)
Patient would like to be referred to [facility]. She understands that she will need to apply for financial assistance.

## 2021-04-25 NOTE — Telephone Encounter (Signed)
She would likely have to pay $100 up front and apply for financial assistance at [facility] or [facility].   I am not aware of any local neurologists where she could be seen at lower cost.

## 2021-04-30 ENCOUNTER — Ambulatory Visit: Payer: Self-pay | Admitting: Clinical

## 2021-04-30 ENCOUNTER — Other Ambulatory Visit: Payer: Self-pay

## 2021-04-30 DIAGNOSIS — F419 Anxiety disorder, unspecified: Secondary | ICD-10-CM

## 2021-04-30 NOTE — Progress Notes (Signed)
   THERAPY PROGRESS NOTE  Session Time: 9-9:50 Participation Level: Active Behavioral Response: CasualAlertEuthymic Type of Therapy: Individual Therapy Treatment Goals addressed: Learn and implement coping skills that result in a reduction of anxiety and worry, and improved daily functioning.  Purpose: LCSW met with client for routine individual therapy to work towards treatment goals: Learn and implement coping skills that result in a reduction of anxiety and worry, and improved daily functioning.  Intervention: LCSW met with patient for routine individual therapy to continue to work twards treatment goals. LCSW provided patient opportunity to check in to assess for any significant events and how she is doing today. LCSW provided reflective listening as patient processed her coping skills to manage her anxiety. LCSW utilized intervention of CBT by providing psychoeducation of relaxation skills from therapistaid. LCSW facilitated exercises provided of deep breathing, imagery, and progressive muscle relaxation. With patient discussed connection between mind and body, and a skill to relax and cope with physical health can be meditation and relaxation skills. To summarize session LCSW as patient which skill she found most helpful and encouraged to continue. LCSW assessed for SI/HI/command psychosis.  Effectiveness: Patient is alert x4 affect. Patient reports today she is feeling somewhat better more relaxed. Patient participated she has been able to feel more active in her home and want to go out. Patient recognizes the toll her physical health and anxiety was having on her. Patient shared it took effort but feels good she was able to do these things. Patient shared she is looking forward to the weekend to celebrate her oldest daughter birthday and possibly will be going to the best. Reports this would be the first vacation following restrictions from the pandemic. Patient expressed understanding of the  mind and body approach. Patient shared her meditation is spirtiual by praying has helped cope. Patient participated in the exercises of deep breathing, reports feeling relief. Patient identified for imagery she wasn't able to visualize a calming place, reports her image was of a white space and silence but found this relaxing. Patient also participated in the progressive muscle relaxation. Intervention was effective as patient was able to reflect and participated in the session. Patient reports she found the deep breathing as most effective. LCSW encouraged she could also combine it with her meditation of prayer. Progress towards goal is Ongoing. Patient denied active suicidal/homicidal/active psychosis.  Plan Patient offered next appointment for: 06/21 10am  Diagnosis: unspecified anxiety disorder    Lujean Rave, LCSW 04/30/2021

## 2021-05-06 ENCOUNTER — Telehealth: Payer: Self-pay | Admitting: Internal Medicine

## 2021-05-06 NOTE — Telephone Encounter (Signed)
Patient called to let you know that she started taken Topiramate as prescribed, but she stated have haven secondary effects and wants to consult with you if she needs to keep taken the medicine or if she should stop it. Symptoms include, numbness on face and arms, and heavy eyelids with a sensation of palpitations on it. Please advise.

## 2021-05-07 ENCOUNTER — Encounter: Payer: Self-pay | Admitting: Internal Medicine

## 2021-05-07 ENCOUNTER — Ambulatory Visit: Payer: Self-pay | Admitting: Internal Medicine

## 2021-05-07 ENCOUNTER — Other Ambulatory Visit: Payer: Self-pay

## 2021-05-07 VITALS — BP 100/78 | HR 102 | Resp 14 | Ht 59.5 in | Wt 121.0 lb

## 2021-05-07 DIAGNOSIS — M329 Systemic lupus erythematosus, unspecified: Secondary | ICD-10-CM

## 2021-05-07 DIAGNOSIS — G43119 Migraine with aura, intractable, without status migrainosus: Secondary | ICD-10-CM

## 2021-05-07 MED ORDER — TOPIRAMATE 25 MG PO TABS: ORAL_TABLET | ORAL | 11 refills | Status: DC

## 2021-05-07 NOTE — Telephone Encounter (Signed)
Patient was schedule to see Doctor on 05/07/2021.

## 2021-05-07 NOTE — Progress Notes (Addendum)
Subjective:    Patient ID: Audrey [name], female   DOB: Jan 03, 1981, 40 y.o.   MRN: [id number]   HPI  Interpreted by Richrd Sox  Patient's concern for side effects from Topiramate for migraines.  She describes numbness of her face, arm and leg on left.  Also dizziness. Discussed she was initiated on the medication to treat these symptoms, which are felt to likely be symptoms associated with her migraines.  Patient also described left ear "plugging", left nostril congestion and difficulties with balance prior to onset of left sided headache.  Had left sided numbness of face, arm and leg with migraine more recently.  See visit from 04/03/2021 after ED visit. Perhaps, the only new symptoms since starting Topiramate  is just numbness and tingling around her mouth.   Last migraine was over 3 weeks ago (until yesterday after decreasing her dose again).  Sounds like since she had been taking 75 mg or 3 tabs nightly, she had not had a migraine and was not really having much in way of numbness, tingling, or dizziness.  Patient increased her dose to 100 mg about 1 week ago and then 4 days ago, she had an episode where her whole face became numb.  She decreased her dose to 2 tabs, but day before yesterday, started developing a left sided headache.        Current Meds  Medication Sig   calcium-vitamin D (OSCAL WITH D) 500-200 MG-UNIT tablet Take 2 tablets by mouth daily with breakfast.   ferrous gluconate (FERGON) 324 MG tablet Take 1 tablet (324 mg total) by mouth 2 (two) times daily with a meal.   folic acid (FOLVITE) 1 MG tablet Take 1 mg by mouth daily.   hydroxychloroquine (PLAQUENIL) 200 MG tablet 2 tabs by mouth once daily   ibuprofen (ADVIL,MOTRIN) 200 MG tablet Take 400 mg by mouth every 6 (six) hours as needed for fever.    levonorgestrel (MIRENA) 20 MCG/24HR IUD 1 each by Intrauterine route once.   methotrexate (RHEUMATREX) 2.5 MG tablet Take 2.5 mg by mouth once  a week. Take 8 tablets by mouth once a week   topiramate (TOPAMAX) 25 MG tablet 1 tab by mouth at bedtime for 7 days and increase by 25 mg weekly until taking 4 tabs at bedtime by week 4 and remain on that dose.   No Known Allergies   Review of Systems    Objective:   BP 100/78 (BP Location: Left Arm, Patient Position: Sitting, Cuff Size: Normal)   Pulse (!) 102   Resp 14   Ht 4' 11.5" (1.511 m)   Wt 121 lb (54.9 kg)   BMI 24.03 kg/m   Physical Exam NAD, but does appear somewhat chronically ill.   HEENT:  PERRL, EOMI, discs sharp.  TMs pearly gray, throat without injection. Neck:  Supple, No adenopathy Chest:  CTA CV:  RRR without murmur or rub.  Radial and DP pulses normal and equal Neuro:  A & O x 3 CN II-XII grossly intact.  Sensation to light touch mildly decreased on left face, left arm trunk, though normal compared to right throughout left leg dermatomes. Motor 5/5 throughout.  Unable to obtain any of DTRs.  Gait normal   Assessment & Plan    Migraines with left sided neurologic symptoms and possible dysesthesia from use of Topiramate.  Her migraines may be better controlled on 75 mg with the numbness and tingling about her mouth.  She will increase back  to the 75 mg dosing and hold on any increase.  She has a follow up next week with me and will see if doing better then.          She and her husband did not respond to Neurology referral calls since ED visit and today she states would prefer going to [facility] and apply for financial assistance.  With her history of SLE, just want to be certain no other concern as cause for her symptoms.   She had normal CT of brain 09/20/2020 with migraine Since, she had undergone CT angio of brain and neck and MR with and without contrast of brain and cervical spine, all of which were normal.  Sed Rate and CRP checked in May were normal as well.  Previously elevated when SLE active/untreated.

## 2021-05-14 ENCOUNTER — Other Ambulatory Visit: Payer: Self-pay | Admitting: Clinical

## 2021-05-15 ENCOUNTER — Encounter: Payer: Self-pay | Admitting: Internal Medicine

## 2021-05-15 ENCOUNTER — Other Ambulatory Visit: Payer: Self-pay

## 2021-05-15 ENCOUNTER — Ambulatory Visit: Payer: Self-pay | Admitting: Internal Medicine

## 2021-05-15 VITALS — BP 130/92 | HR 112 | Resp 16 | Ht 59.5 in | Wt 118.0 lb

## 2021-05-15 DIAGNOSIS — I951 Orthostatic hypotension: Secondary | ICD-10-CM

## 2021-05-15 DIAGNOSIS — D649 Anemia, unspecified: Secondary | ICD-10-CM

## 2021-05-15 DIAGNOSIS — R000 Tachycardia, unspecified: Secondary | ICD-10-CM

## 2021-05-15 DIAGNOSIS — R6889 Other general symptoms and signs: Secondary | ICD-10-CM

## 2021-05-15 LAB — POC COVID19 BINAXNOW: SARS Coronavirus 2 Ag: NEGATIVE

## 2021-05-15 NOTE — Progress Notes (Signed)
Subjective:    Patient ID: Audrey [name], female   DOB: [birth year], 40 y.o.   MRN: [id number]   HPI  Audrey [name] interprets.  Decreased Topamax for 3 days to 50 mg, then restarted at 75 mg daily about 7 days ago.  Her left sided headache started to decrease 2 days ago.  She feels the left sided numbness is the same.   The dizziness is the same.  Previously, described more vertigo. Occasional numbness of tongue and lips, not severe as she was having on the higher dose.      Previously, when improved, took 2-3 weeks until she was up on the 75 mg dosing.    She is now also feeling congested, lymph node tenderness on the anterior cervical area and posterior pharyngeal drainage.  No one at home has been ill.  No sore throat.  No fever.    States she is drinking fluids well, but not eating much.  Just feels like every time she starts to eat, she gets symptoms and just doesn't feel well.    Her hemoglobin was down to 11.8 on the 26th of April.  This from 13.7 on the 11th.  She states she is still king Ferrous gluconate, but generally only once daily.  Her periods are chronically heavy and she state she had one just before her last visit on June 10th.  Lasted a total of 8 days, which is about 2 days longer than usual.  No pelvic pain. No melena or hematochezia.  Denies any stressors other than her health at home.   Current Meds  Medication Sig   calcium-vitamin D (OSCAL WITH D) 500-200 MG-UNIT tablet Take 2 tablets by mouth daily with breakfast.   ferrous gluconate (FERGON) 324 MG tablet Take 1 tablet (324 mg total) by mouth 2 (two) times daily with a meal.   folic acid (FOLVITE) 1 MG tablet Take 1 mg by mouth daily.   hydroxychloroquine (PLAQUENIL) 200 MG tablet 2 tabs by mouth once daily   ibuprofen (ADVIL,MOTRIN) 200 MG tablet Take 400 mg by mouth every 6 (six) hours as needed for fever.    levonorgestrel (MIRENA) 20 MCG/24HR IUD 1 each by Intrauterine route once.    methotrexate (RHEUMATREX) 2.5 MG tablet Take 2.5 mg by mouth once a week. Take 8 tablets by mouth once a week   topiramate (TOPAMAX) 25 MG tablet 3 tabs by mouth at bedtime   No Known Allergies   Review of Systems    Objective:   BP (!) 130/92 (BP Location: Right Arm, Patient Position: Sitting, Cuff Size: Normal)   Pulse (!) 112   Resp 16   Ht 4' 11.5" (1.511 m)   Wt 118 lb (53.5 kg)   LMP 05/03/2021   BMI 23.43 kg/m   Physical Exam NAD Looks chronically ill. Conjunctivae with mild paleness.  Palms with good red coloration.  Hands and feet cool, but with good cap refill HEENT:  PERRL, EOMI, TMs pearly gray, nasal mucosa without inflammation or discharge.  Throat without injection Neck:  Supple, No adenopathy, no thyromegaly Chest:  CTA CV:  RRR without murmur or rub.  Tachycardic.  Peripheral pulses equal.  Dizziness with standing. Abd:  S, NT, No HSM or mass, + BS Neuro:  A & O x 3, CN II-XII grossly intact save for slight decrease in sensation on left face and entire left side, including leg today. Motor 5/5 gait normal.  Finger to nose to finger, rapid alt  motion normal.  Negative romberg.   Assessment & Plan   Dizziness:  this seems less like vertigo and much more obviously orthostasis today.  Supported by increased HR and mildly decreased BP with standing.  Likely due to anemia from heavy periods and poor po intake. Encouraged her to lie down to eat and drink and to drink an Ensure if she just is unable to eat.  Sounds like she gets dizzy when sitting up and then unable to eat or drink and maintains dehydration. Encouraged eight 8 ounce bottles of water daily as well and to take Ferrous gluconate twice daily every day. To call in report of progress on Friday (2 days) CBC (had to cancel as unable to obtain).  CMP, TSH today.   Hold MTX until returns in 2 weeks.  2.  Migraine HA:  slightly improved from when she cut her dose in half.  The 75 mg seemed to improve HA and  the 100 mg cause facial numbness.  Will see if this controls long term once she is back on it for a couple of weeks. Awaiting [facility] Neuro referral to obtain specialist opinion.    3.  URI symptoms:  no significant findings on exam today and COVID antigen testing negative.  Return in 2 weeks.

## 2021-05-16 ENCOUNTER — Encounter: Payer: Self-pay | Admitting: Internal Medicine

## 2021-05-16 ENCOUNTER — Ambulatory Visit: Payer: Self-pay | Admitting: Internal Medicine

## 2021-05-16 VITALS — BP 112/80 | HR 92 | Resp 12 | Ht 59.5 in | Wt 120.0 lb

## 2021-05-16 DIAGNOSIS — K921 Melena: Secondary | ICD-10-CM

## 2021-05-16 LAB — COMPREHENSIVE METABOLIC PANEL WITH GFR
ALT: 38 IU/L — ABNORMAL HIGH (ref 0–32)
AST: 37 IU/L (ref 0–40)
Albumin/Globulin Ratio: 1.9 (ref 1.2–2.2)
Albumin: 4.8 g/dL (ref 3.8–4.8)
Alkaline Phosphatase: 89 IU/L (ref 44–121)
BUN/Creatinine Ratio: 4 — ABNORMAL LOW (ref 9–23)
BUN: 3 mg/dL — ABNORMAL LOW (ref 6–20)
Bilirubin Total: 0.3 mg/dL (ref 0.0–1.2)
CO2: 16 mmol/L — ABNORMAL LOW (ref 20–29)
Calcium: 9 mg/dL (ref 8.7–10.2)
Chloride: 103 mmol/L (ref 96–106)
Creatinine, Ser: 0.67 mg/dL (ref 0.57–1.00)
Globulin, Total: 2.5 g/dL (ref 1.5–4.5)
Glucose: 118 mg/dL — ABNORMAL HIGH (ref 65–99)
Potassium: 3.9 mmol/L (ref 3.5–5.2)
Sodium: 133 mmol/L — ABNORMAL LOW (ref 134–144)
Total Protein: 7.3 g/dL (ref 6.0–8.5)
eGFR: 114 mL/min/1.73

## 2021-05-16 LAB — TSH: TSH: 0.649 u[IU]/mL (ref 0.450–4.500)

## 2021-05-16 MED ORDER — OMEPRAZOLE 40 MG PO CPDR: DELAYED_RELEASE_CAPSULE | ORAL | 2 refills | Status: DC

## 2021-05-16 NOTE — Progress Notes (Signed)
Subjective:    Patient ID: Audrey [name], female   DOB: 1981-06-07, 40 y.o.   MRN: [id number]   HPI  Interpreted by [name]  Patient back today as she thought about the questions about having black sticky stools and decided she is.  She thought it was just because she is taking iron supplementation. Has been taking iron for months, but just started having black, sticky stools in past 2 weeks.  She apparently started a new type of iron about 3 weeks before the change in stool--shows me a bottle of Ferrous sulfate that are dark green.  Every stool in past 2 weeks has been black. Denies epigastric pain, but does have early satiety.  Does have nausea at times and only has after eating and when first arises in morning.  Has had definite weight loss as well, though weight up a couple of pounds in past 24 hours. Has taken 400 mg of Ibuprofen about 6 times in past month.  Last dose was 2 days ago.  No other NSAIDS.   Her last dose of MTX was about 1 week ago.  She is due tomorrow, but knows to hold that from her visit yesterday.    She did obtain Ensure and drank about 1/2 can this morning.  She is drinking more water since here yesterday.    Discussed CMP from yesterday was fairly normal.  She was nonfasting with glucose of 118.  TSH was normal as well.  Rapid Covid antigen testing negative yesterday discussed prior to being discharged yesterday.  Current Meds  Medication Sig   calcium-vitamin D (OSCAL WITH D) 500-200 MG-UNIT tablet Take 2 tablets by mouth daily with breakfast.   ferrous gluconate (FERGON) 324 MG tablet Take 1 tablet (324 mg total) by mouth 2 (two) times daily with a meal.   folic acid (FOLVITE) 1 MG tablet Take 1 mg by mouth daily.   hydroxychloroquine (PLAQUENIL) 200 MG tablet 2 tabs by mouth once daily   ibuprofen (ADVIL,MOTRIN) 200 MG tablet Take 400 mg by mouth every 6 (six) hours as needed for fever.    levonorgestrel (MIRENA) 20 MCG/24HR IUD 1 each by  Intrauterine route once.   methotrexate (RHEUMATREX) 2.5 MG tablet Take 2.5 mg by mouth once a week. Take 8 tablets by mouth once a week   topiramate (TOPAMAX) 25 MG tablet 3 tabs by mouth at bedtime   No Known Allergies   Review of Systems    Objective:   BP 112/80 (BP Location: Left Arm, Patient Position: Sitting, Cuff Size: Normal)   Pulse 92   Resp 12   Ht 4' 11.5" (1.511 m)   Wt 120 lb (54.4 kg)   LMP 05/03/2021   BMI 23.83 kg/m   Physical Exam Appears chronically ill and anxious HEENT:  palpebral conjunctivae perhaps a bit pale Neck:  Supple, No adenopathy Chest:  CTA CV:  RRR without murmur or rub.  Radial and DP pulses normal and equal.  Hands and feet cool (chronic for patient) Abd:  S, NT, No HSM or mass, + BS. Rectal:  scant amount of stool with black flakes, but guaiac negative.    Assessment & Plan  Reported black sticky stools, but stool today heme negative on exam:  She did change iron preparations just before noting the change and likely the cause. Unable to obtain CBC yesterday.  Successful today and will see if hemoglobin has dropped.   With early satiety, concerned may have a gastritis/PUD and  will start Prilosec 40 mg daily. She will continue the Ensure and call a progress report next week--also meeting with Danton Clap, LCSW for counseling. She is having a constellation of symptoms involving upper respiratory, GI and migrainous/Neurologic that have not been able to tie together.  Her anxiety issues are also exacerbated. I have asked her to hold her methotrexate this week until she is taking oral intake better. No NSAIDS for now as well.

## 2021-05-17 LAB — CBC WITH DIFFERENTIAL/PLATELET
Basophils Absolute: 0 10*3/uL (ref 0.0–0.2)
Basos: 0 %
EOS (ABSOLUTE): 0 10*3/uL (ref 0.0–0.4)
Eos: 0 %
Hematocrit: 39.3 % (ref 34.0–46.6)
Hemoglobin: 13.3 g/dL (ref 11.1–15.9)
Immature Grans (Abs): 0 10*3/uL (ref 0.0–0.1)
Immature Granulocytes: 0 %
Lymphocytes Absolute: 1.2 10*3/uL (ref 0.7–3.1)
Lymphs: 27 %
MCH: 29.4 pg (ref 26.6–33.0)
MCHC: 33.8 g/dL (ref 31.5–35.7)
MCV: 87 fL (ref 79–97)
Monocytes Absolute: 0.3 10*3/uL (ref 0.1–0.9)
Monocytes: 8 %
Neutrophils Absolute: 2.8 10*3/uL (ref 1.4–7.0)
Neutrophils: 65 %
Platelets: 198 10*3/uL (ref 150–450)
RBC: 4.52 x10E6/uL (ref 3.77–5.28)
RDW: 14.1 % (ref 11.7–15.4)
WBC: 4.4 10*3/uL (ref 3.4–10.8)

## 2021-05-21 ENCOUNTER — Ambulatory Visit: Payer: Self-pay | Admitting: Clinical

## 2021-05-21 ENCOUNTER — Other Ambulatory Visit: Payer: Self-pay

## 2021-05-21 ENCOUNTER — Other Ambulatory Visit: Payer: Self-pay | Admitting: Clinical

## 2021-05-21 DIAGNOSIS — F419 Anxiety disorder, unspecified: Secondary | ICD-10-CM

## 2021-05-24 ENCOUNTER — Telehealth: Payer: Self-pay | Admitting: Internal Medicine

## 2021-05-24 ENCOUNTER — Ambulatory Visit: Payer: Self-pay | Admitting: Internal Medicine

## 2021-05-24 VITALS — BP 110/90

## 2021-05-24 DIAGNOSIS — R030 Elevated blood-pressure reading, without diagnosis of hypertension: Secondary | ICD-10-CM

## 2021-05-24 NOTE — Telephone Encounter (Signed)
Pt. Called with one week progress report as requested.  Pt. States she has been able to eat better . She is eating solids and some ensure.  Symptoms have gotten better now but is still feeling dizzy and tingling    Tuesday she experienced a headache that lasted for 7 hours. Feels more like pressure not headache on left side.   Pt. Requested appointment for bp check today at 3pm  Dr. Delrae Alfred informed pf progress report

## 2021-05-24 NOTE — Progress Notes (Signed)
   THERAPY PROGRESS NOTE  Session Time: 4-5p Participation Level: Active Behavioral Response: CasualAlertAnxious Type of Therapy: Individual Therapy Treatment Goals addressed: Learn and implement coping skills that result in a reduction of anxiety and worry, and improved daily functioning.     Purpose: LCSW met with client for routine individual therapy to work towards treatment goals: Learn and implement coping skills that result in a reduction of anxiety and worry, and improved daily functioning.   Intervention: LCSW met with patient for routine individual therapy to continue to assess for any significant events and how she is doing today. LCSW utilized intervention of CBT to decrease anxiety and manage her chronic health condition. LCSW provided reflective listening as patient processed how she is feeling. LCSW facilitated exercise of body scan and combined it with her gratitude by thanking the part that caught her attention during the exercise. In session discussed LCSW assessed for SI/HI/command psychosis.  Effectiveness: Patient is alert x4 affect. Patient reports she is okay today continuing to struggle with her condition and not knowing exactly what is going on. Patient reports she is doing her best to manage it. Patient reports to manage the anxiety/stress of it she looking up to her faith, and her husband has been very supportive. Patient shared due to this pain she wasn't able to truly enjoy her vacation with her family. Patient reports this pain is disrupting her daily activities such as taking care of her home as she is feeling dizzy, and tired. Patient reports she tries to the best of her ability to be active. Patient reports she is pushing herself to feel better as she has managed lupus she feels capable to manage this too. Patient was active in the participation of the exercise of body scan and felt attention to her head. Combining with execise of gratitude, with LCSW practiced  expressing gratitude to the head the positives of it to practice redirecting approach and concentration in a different way as a way to manage this pain. Patient reports she practices her breathing exercises as well has been helpful. Patient displayed tears as she processed her motivation to feel better using her faith, and support. Patient does note she contacted another provider because she didn't want to go back to the ER because of ear pain, was given medication for it. Patient does note she does have to tell PCP about this. Patient did note concern to see neurology as [facility], and is confused if to go to [facility] instead. Progress towards goal is Ongoing. Patient denied active suicidal/homicidal/active psychosis.   Patient did express understanding to LCSW as LCSW informed of termination date and would like one more appointment.  Plan Patient offered next appointment for: 06/04/21 9am  Diagnosis:  unspecified anxiety disorder    Lujean Rave, LCSW 05/24/2021

## 2021-05-28 ENCOUNTER — Emergency Department (HOSPITAL_COMMUNITY): Payer: Self-pay

## 2021-05-28 ENCOUNTER — Encounter: Payer: Self-pay | Admitting: Neurology

## 2021-05-28 ENCOUNTER — Other Ambulatory Visit: Payer: Self-pay

## 2021-05-28 ENCOUNTER — Encounter (HOSPITAL_COMMUNITY): Payer: Self-pay | Admitting: Emergency Medicine

## 2021-05-28 ENCOUNTER — Inpatient Hospital Stay (HOSPITAL_COMMUNITY)
Admission: EM | Admit: 2021-05-28 | Discharge: 2021-06-03 | DRG: 074 | Disposition: A | Discharge status: Home / Self Care | Payer: Self-pay | Attending: Internal Medicine | Admitting: Internal Medicine

## 2021-05-28 DIAGNOSIS — R519 Headache, unspecified: Secondary | ICD-10-CM | POA: Diagnosis present

## 2021-05-28 DIAGNOSIS — Z79899 Other long term (current) drug therapy: Secondary | ICD-10-CM

## 2021-05-28 DIAGNOSIS — Z833 Family history of diabetes mellitus: Secondary | ICD-10-CM

## 2021-05-28 DIAGNOSIS — E876 Hypokalemia: Secondary | ICD-10-CM | POA: Diagnosis present

## 2021-05-28 DIAGNOSIS — R531 Weakness: Secondary | ICD-10-CM

## 2021-05-28 DIAGNOSIS — Z8249 Family history of ischemic heart disease and other diseases of the circulatory system: Secondary | ICD-10-CM

## 2021-05-28 DIAGNOSIS — Z20822 Contact with and (suspected) exposure to covid-19: Secondary | ICD-10-CM | POA: Diagnosis present

## 2021-05-28 DIAGNOSIS — R112 Nausea with vomiting, unspecified: Secondary | ICD-10-CM

## 2021-05-28 DIAGNOSIS — R29818 Other symptoms and signs involving the nervous system: Secondary | ICD-10-CM | POA: Diagnosis present

## 2021-05-28 DIAGNOSIS — R0789 Other chest pain: Secondary | ICD-10-CM | POA: Diagnosis present

## 2021-05-28 DIAGNOSIS — G43909 Migraine, unspecified, not intractable, without status migrainosus: Secondary | ICD-10-CM | POA: Diagnosis present

## 2021-05-28 DIAGNOSIS — G6181 Chronic inflammatory demyelinating polyneuritis: Principal | ICD-10-CM | POA: Diagnosis present

## 2021-05-28 DIAGNOSIS — R0602 Shortness of breath: Secondary | ICD-10-CM

## 2021-05-28 DIAGNOSIS — K219 Gastro-esophageal reflux disease without esophagitis: Secondary | ICD-10-CM | POA: Diagnosis present

## 2021-05-28 DIAGNOSIS — M329 Systemic lupus erythematosus, unspecified: Secondary | ICD-10-CM | POA: Diagnosis present

## 2021-05-28 LAB — COMPREHENSIVE METABOLIC PANEL
ALT: 15 U/L (ref 0–44)
AST: 36 U/L (ref 15–41)
Albumin: 3.5 g/dL (ref 3.5–5.0)
Alkaline Phosphatase: 50 U/L (ref 38–126)
Anion gap: 8 (ref 5–15)
BUN: 1 mg/dL — ABNORMAL LOW (ref 6–20)
CO2: 18 mmol/L — ABNORMAL LOW (ref 22–32)
Calcium: 7.8 mg/dL — ABNORMAL LOW (ref 8.9–10.3)
Chloride: 112 mmol/L — ABNORMAL HIGH (ref 98–111)
Creatinine, Ser: 0.58 mg/dL (ref 0.44–1.00)
GFR, Estimated: >60 mL/min (ref >60)
Glucose, Bld: 93 mg/dL (ref 70–99)
Potassium: 3.8 mmol/L (ref 3.5–5.1)
Sodium: 138 mmol/L (ref 135–145)
Total Bilirubin: 0.7 mg/dL (ref 0.3–1.2)
Total Protein: 6 g/dL — ABNORMAL LOW (ref 6.5–8.1)

## 2021-05-28 LAB — CBC WITH DIFFERENTIAL/PLATELET
Abs Immature Granulocytes: 0.03 10*3/uL (ref 0.00–0.07)
Basophils Absolute: 0 10*3/uL (ref 0.0–0.1)
Basophils Relative: 0 %
Eosinophils Absolute: 0 10*3/uL (ref 0.0–0.5)
Eosinophils Relative: 0 %
HCT: 36.1 % (ref 36.0–46.0)
Hemoglobin: 12.2 g/dL (ref 12.0–15.0)
Immature Granulocytes: 1 %
Lymphocytes Relative: 15 %
Lymphs Abs: 0.9 10*3/uL (ref 0.7–4.0)
MCH: 30 pg (ref 26.0–34.0)
MCHC: 33.8 g/dL (ref 30.0–36.0)
MCV: 88.9 fL (ref 80.0–100.0)
Monocytes Absolute: 0.3 10*3/uL (ref 0.1–1.0)
Monocytes Relative: 6 %
Neutro Abs: 4.5 10*3/uL (ref 1.7–7.7)
Neutrophils Relative %: 78 %
Platelets: 171 10*3/uL (ref 150–400)
RBC: 4.06 MIL/uL (ref 3.87–5.11)
RDW: 13.2 % (ref 11.5–15.5)
WBC: 5.7 10*3/uL (ref 4.0–10.5)
nRBC: 0 % (ref 0.0–0.2)

## 2021-05-28 LAB — SEDIMENTATION RATE: Sed Rate: 5 mm/hr (ref 0–22)

## 2021-05-28 LAB — MAGNESIUM: Magnesium: 1.8 mg/dL (ref 1.7–2.4)

## 2021-05-28 LAB — C-REACTIVE PROTEIN: CRP: <0.5 mg/dL (ref <1.0)

## 2021-05-28 LAB — TROPONIN I (HIGH SENSITIVITY): Troponin I (High Sensitivity): 3 ng/L (ref <18)

## 2021-05-28 MED ORDER — SODIUM CHLORIDE 0.9 % IV BOLUS
1000.0000 mL | Freq: Once | INTRAVENOUS | Status: AC
  Administered 2021-05-28: 1000 mL via INTRAVENOUS

## 2021-05-28 MED ORDER — LORAZEPAM 2 MG/ML IJ SOLN
1.0000 mg | Freq: Once | INTRAMUSCULAR | Status: AC
  Administered 2021-05-28: 1 mg via INTRAVENOUS
  Filled 2021-05-28: qty 1

## 2021-05-28 MED ORDER — GADOBUTROL 1 MMOL/ML IV SOLN
5.0000 mL | Freq: Once | INTRAVENOUS | Status: AC | PRN
  Administered 2021-05-28: 5 mL via INTRAVENOUS

## 2021-05-28 NOTE — ED Notes (Signed)
hospitalist in room with pt at this time  

## 2021-05-28 NOTE — ED Triage Notes (Addendum)
Pt BIB [facility] from home, c/o left arm numbness and tingling, along with bilateral hand cramping that started this morning. Pt seen by PCP for intermittent nausea/vomiting and facial numbness x 2 months. Pt c/o left sided headache, hx migraines and some shortness of breath.

## 2021-05-28 NOTE — Consult Note (Signed)
NEUROLOGY CONSULTATION NOTE   Date of service: May 28, 2021 Patient Name: Audrey [name] MRN:  [id number] DOB:  06/25/1981 Reason for consult: "BL lower ext weakness with left worse than right" Requesting Provider: Lorre NickAllen, Anthony, MD _ _ _   _ __   _ __ _ _  __ __   _ __   __ _  History of Present Illness  Audrey [name] is a 40 y.o. 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  She reports feeling dizzy and tired for a few week and no strength in her arms and legs with numbness in hand and feet and in her face. Feels that she now has a headache on the left side of her head, feels similar to her migraine with pressure. Also has associate nausea but no vomiting. Report off and on hand and legs weakness for the last few months. This episode has been going on for a few weeks now. Have been getting worse and worse. Today came in as she could not straighten her hands and could not walk. Feels tired and out of energy. She has never had any contractures in her hands so she came in.  She has not been eating much due to nausea. Feels symptoms are worse on the left side. Also feels her heart is racing and she has pressure on her chest.  No fever, no chills, no obvious signs of a UTI except for nasal congestion taht improved with sinus sprays and having some phlegm. No sore throat, no cough. No signs of UTI. No diarrhea or vomiting concerning for a gastroenteritis. Has not been out of the country in several years, no history of TB.    ROS   Constitutional Denies weight loss, fever and chills.  HEENT Denies changes in vision and hearing.  Respiratory some SOB but  no cough.  CV endorses palpitations and CP  GI Denies abdominal pain, endorses nausea but no vomiting and diarrhea.  GU Denies dysuria and urinary frequency.  MSK endorses myalgia and joint pain.  Skin Denies rash and pruritus.  Neurological Denies headache and syncope.  Psychiatric Denies recent changes in mood. Denies anxiety and depression.   Past History   Past Medical History:  Diagnosis Date   Lupus (HCC)    Symptomatic anemia 12/03/2018   Past Surgical History:  Procedure Laterality Date   DILATION AND CURETTAGE OF UTERUS  2007   following [facility]   LAPAROSCOPIC CHOLECYSTECTOMY  2009   Family History  Problem Relation Age of Onset   Cancer Mother        Esophageal.   Diabetes Mother    Hypertension Father    Irritable bowel syndrome Sister    Hyperlipidemia Son    Social History   Socioeconomic History   Marital status: Married    Spouse name: Audrey [name]   Number of children: 3   Years of education: 9   Highest education level: Not on file  Occupational History   Occupation: Housewife  Tobacco Use   Smoking status: Never   Smokeless tobacco: Never  Vaping Use   Vaping Use: Never used  Substance and Sexual Activity   Alcohol use: No  Drug use: Never   Sexual activity: Yes    Birth control/protection: I.U.D.  Other Topics Concern   Not on file  Social History Narrative   Lives with husband and 3 children kids.   Social Determinants of Health   Financial Resource Strain: Low Risk    Difficulty of Paying Living Expenses: Not hard at all  Food Insecurity: No Food Insecurity   Worried About Programme researcher, broadcasting/film/video in the Last Year: Never true   Ran Out of Food in the Last Year: Never true  Transportation Needs: No Transportation Needs   Lack of Transportation (Medical): No   Lack of Transportation (Non-Medical): No  Physical Activity: Not on file  Stress: Not on file  Social Connections: Not on file   No Known Allergies  Medications  (Not in a  hospital admission)    Vitals   Vitals:   05/28/21 1907 05/28/21 1930 05/28/21 1945 05/28/21 2100  BP: 115/76 116/72 114/68 111/72  Pulse: 77 77 72 83  Resp: (!) 27 19 (!) 24 18  Temp:      TempSrc:      SpO2: 100% 100% 100% 100%     There is no height or weight on file to calculate BMI.  Physical Exam   General: Laying comfortably in bed; in no acute distress. HENT: Normal oropharynx and mucosa. Normal external appearance of ears and nose. Neck: Supple, no pain or tenderness CV: No JVD. No peripheral edema.  Pulmonary: Symmetric Chest rise. Normal respiratory effort.  Abdomen: Soft to touch, non-tender.  Ext: No cyanosis, edema, or deformity  Skin: No rash. Normal palpation of skin.   Musculoskeletal: Normal digits and nails by inspection. No clubbing.   Neurologic Examination  Mental status/Cognition: Alert, oriented to self, place, month and year, good attention. Speech/language: Fluent, comprehension intact, object naming intact, repetition intact. Cranial nerves:   CN II Pupils equal and reactive to light, no VF deficits   CN III,IV,VI EOM intact, no gaze preference or deviation, no nystagmus   CN V normal sensation in V1, V2, and V3 segments bilaterally   CN VII no asymmetry, no nasolabial fold flattening   CN VIII normal hearing to speech   CN IX & X normal palatal elevation, no uvular deviation   CN XI 5/5 head turn and 5/5 shoulder shrug bilaterally   CN XII midline tongue protrusion   Motor:  Muscle bulk: poor, tone normal Mvmt Root Nerve  Muscle Right Left Comments  SA C5/6 Ax Deltoid 4- 4-   EF C5/6 Mc Biceps 4- 4-   EE C6/7/8 Rad Triceps 4- 4-   WF C6/7 Med FCR 2 2   WE C7/8 PIN ECU 2 2   F Ab C8/T1 U ADM/FDI 1 1   HF L1/2/3 Fem Illopsoas 4 4   KE L2/3/4 Fem Quad 4+ 4+   DF L4/5 D Peron Tib Ant 3 3   PF S1/2 Tibial Grc/Sol 3 3    Reflexes:  Right Left Comments  Pectoralis      Biceps (C5/6) 0 0   Brachioradialis (C5/6) 1 1    Triceps (C6/7) 1  1    Patellar (L3/4) 0 0    Achilles (S1) 0 0    Hoffman      Plantar mute mute   Jaw jerk    Sensation:  Light touch Decreased in length dependent fashion in BL upper and lower extremities.   Pin prick Hyperasthesia to pinprick in BL upper and BL  lower extremities.   Temperature Decreased in BL uppers and lower extremities   Vibration Decreased in BL uppers and lower extremities  Proprioception    Coordination/Complex Motor:  - Finger to Nose intact BL but slowed. - Heel to shin intact BL - Rapid alternating movement are slowed. - Gait: unsafe to assess 2/2 weakness.  Labs   CBC:  Recent Labs  Lab 05/28/21 1907  WBC 5.7  NEUTROABS 4.5  HGB 12.2  HCT 36.1  MCV 88.9  PLT 171    Basic Metabolic Panel:  Lab Results  Component Value Date   NA 138 05/28/2021   K 3.8 05/28/2021   CO2 18 (L) 05/28/2021   GLUCOSE 93 05/28/2021   BUN 1 (L) 05/28/2021   CREATININE 0.58 05/28/2021   CALCIUM 7.8 (L) 05/28/2021   GFRNONAA >60 05/28/2021   GFRAA 134 02/16/2020   Lipid Panel:  Lab Results  Component Value Date   LDLCALC 87 03/04/2021   HgbA1c:  Lab Results  Component Value Date   HGBA1C 5.2 03/04/2021   Urine Drug Screen: No results found for: LABOPIA, COCAINSCRNUR, LABBENZ, AMPHETMU, THCU, LABBARB  Alcohol Level No results found for: [facility]   MR C spine with and without contrast: No acute abnormalities.  MRI Brain with and without contrast: No acute abnormalities   Impression   Yeng Frankie is a 40 y.o. 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 The numbness  and weakneess in her extremities has happened in the past and is gradually progressive this time over the course of 5-6 weeks. Has had similar weakness off and on for a few months. Her neurologic examination is notable for distal worse than proximal weakness in all extremities along with sensory deficit in all extremities to light touch, vibration and temp with hyperasthesia in all extremities compared to her face. She also is hyporeflexic on exam.  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 Unlikely for GBS to worsen past 3 weeks period.  I suspect that this neuropathy is likely secondary to her systemic rheumatological illness. There could potentially be a nutritional component to her symptoms vs polyradiculitis, specially given poor po intake and her being on methotrexate and plaquenil.  Althou her SSA is positive, unlikely for this to sjogrens ganglionopathy given weakness in addition to sensory symptoms.  I discussed risks and benefits of IVIG vs PLEX with patient and she and her husband opted to go with IVIG for now.  Recommendations  - IVIG 0.4mg /Kg Q24 hours x 5 doses, 500cc of IV fluids prior to IVIG. - LP with CSF cell count, differential, protein, glucose, Lyme PCR, HSV PCR, VDRL, CMV PCR, EBV PCR, Parvovirus B19, CSF ACE, IgG index and Oligoclonal bands. - Vit B1levels and thiamine replacement, Vit B6, Vit B12 + MMA, Folate levels, RPR, TSH, SPEP. - Will need to start Multivitamin with multimineral after vitamin levels have been obtained. -  Neurology will continue to follow along. ______________________________________________________________________   Thank you for the opportunity to take part in the care of this patient. If you have any  further questions, please contact the neurology consultation attending.  Signed,  Erick Blinks [facility] Pager Number [phone] _ _ _   _ __   _ __ _ _  __ __   _ __   __ _

## 2021-05-28 NOTE — ED Provider Notes (Signed)
Emergency Medicine Provider Triage Evaluation Note  Audrey [name] , a 40 y.o. female  was evaluated in triage.  Pt complains of left facial numbness, bilateral hand contracting which began 2 hours prior to arrival.  She is followed by [facility] for chronic migraine, on Topamax at baseline.  Review of Systems  Positive: Facial numbness, BL hand pain, headache Negative: Shortness of breath, chest pain  Physical Exam  BP 134/81   Pulse 87   Temp 98.4 F (36.9 C) (Oral)   Resp 18   LMP 05/03/2021   SpO2 100%  Gen:   Overall ill-appearing, contractures of upper extremities. Resp:  Normal effort  MSK:   Moves extremities without difficulty  Other: Dusky lower extremities with pulses present.  Medical Decision Making  Medically screening exam initiated at 6:12 PM.  Appropriate orders placed.  Tyniya Guerrero-Vazquez was informed that the remainder of the evaluation will be completed by another provider, this initial triage assessment does not replace that evaluation, and the importance of remaining in the ED until their evaluation is complete.  Will need acute bed, called placed to [facility] charge nurse for a bed.  I have also spoken to Dr. Erle Crocker who evaluated patient and agrees with stat bed placement.   Claude Manges, PA-C 05/28/21 Marsh Dolly, MD 05/28/21 [phone]

## 2021-05-28 NOTE — ED Notes (Signed)
Visual acuity: R 10/16. L 10/40.

## 2021-05-28 NOTE — ED Provider Notes (Addendum)
[facility] EMERGENCY DEPARTMENT Provider Note   CSN: [id number] Arrival date & time: 05/28/21  1753     History Chief Complaint  Patient presents with   Numbness    Audrey [name] is a 40 y.o. female with pertinent past medical history of lupus on methotrexate and Plaquenil, migraines on Topamax that presents to the emerge department today for left-sided headache, left-sided facial numbness, bilateral hand contracting and weakness on her bilateral upper and lower extremities that began today.  This occurred 2 hours prior to arrival to the ED.  Patient states that she was feeling fine this morning, started having left-sided headache with left-sided facial numbness.  Per patient she states that she has been having facial numbness with intermittent migraines for multiple months, presented similarly however she has never had the head contractions in the weakness. Per chart review patient was seen in April for intermittent dizziness, headache and left-sided facial numbness with weakness to left arm and left leg.  At that time CT angio head, neck MRI brain and cervical spine were done which did not show anything acute.  Today patient is having bilateral upper and lower extremity weakness which is different than presentation in April.  Patient also admits to some chest pain and shortness of breath which have mostly resolved.  Patient was MSE done given Ativan, patient states that she feels better however his hands are still contracting and still feels weak.  Patient is Spanish-speaking, medical interpreter was used. HPI     Past Medical History:  Diagnosis Date   Lupus (HCC)    Symptomatic anemia 12/03/2018    Patient Active Problem List   Diagnosis Date Noted   Intractable episodic headache 09/20/2020   Acute systemic lupus erythematosus (HCC) 12/02/2018   Sinus tachycardia 12/02/2018   Normocytic anemia 12/02/2018   Abdominal pain 12/02/2018   SLE (systemic lupus  erythematosus related syndrome) (HCC) 12/02/2018   Inflammatory arthritis 10/07/2018   VITAMIN D DEFICIENCY 05/29/2009   ALLERGIC RHINITIS 03/30/2009   CHOLECYSTECTOMY, HX OF 08/21/2008   HELICOBACTER PYLORI INFECTION, HX OF 06/13/2008   GERD 06/08/2008   CONSTIPATION 06/08/2008    Past Surgical History:  Procedure Laterality Date   DILATION AND CURETTAGE OF UTERUS  2007   following SAB   LAPAROSCOPIC CHOLECYSTECTOMY  2009     OB History   No obstetric history on file.     Family History  Problem Relation Age of Onset   Cancer Mother        Esophageal.   Diabetes Mother    Hypertension Father    Irritable bowel syndrome Sister    Hyperlipidemia Son     Social History   Tobacco Use   Smoking status: Never   Smokeless tobacco: Never  Vaping Use   Vaping Use: Never used  Substance Use Topics   Alcohol use: No   Drug use: Never    Home Medications Prior to Admission medications   Medication Sig Start Date End Date Taking? Authorizing Provider  calcium-vitamin D (OSCAL WITH D) 500-200 MG-UNIT tablet Take 2 tablets by mouth daily with breakfast. 12/05/18   Dorrell, Cathleen Corti, MD  ferrous gluconate (FERGON) 324 MG tablet Take 1 tablet (324 mg total) by mouth 2 (two) times daily with a meal. 03/05/20   Julieanne Manson, MD  folic acid (FOLVITE) 1 MG tablet Take 1 mg by mouth daily.    [provider]  hydroxychloroquine (PLAQUENIL) 200 MG tablet 2 tabs by mouth once daily 10/19/18  Julieanne Manson, MD  ibuprofen (ADVIL,MOTRIN) 200 MG tablet Take 400 mg by mouth every 6 (six) hours as needed for fever.     [provider]  levonorgestrel (MIRENA) 20 MCG/24HR IUD 1 each by Intrauterine route once.    [provider]  methotrexate (RHEUMATREX) 2.5 MG tablet Take 2.5 mg by mouth once a week. Take 8 tablets by mouth once a week Patient not taking: Reported on 05/16/2021    [provider]  omeprazole (PRILOSEC) 40 MG capsule 1 cap by  mouth 1 hour before breakfast daily 05/16/21   Julieanne Manson, MD  topiramate (TOPAMAX) 25 MG tablet 3 tabs by mouth at bedtime 05/07/21   Julieanne Manson, MD    Allergies    Patient has no known allergies.  Review of Systems   Review of Systems  Constitutional:  Negative for chills, diaphoresis, fatigue and fever.  HENT:  Negative for congestion, sore throat and trouble swallowing.   Eyes:  Negative for pain and visual disturbance.  Respiratory:  Positive for shortness of breath. Negative for cough and wheezing.   Cardiovascular:  Positive for chest pain. Negative for palpitations and leg swelling.  Gastrointestinal:  Negative for abdominal distention, abdominal pain, diarrhea, nausea and vomiting.  Genitourinary:  Negative for difficulty urinating.  Musculoskeletal:  Negative for back pain, neck pain and neck stiffness.  Skin:  Negative for pallor.  Neurological:  Positive for dizziness, weakness, numbness and headaches. Negative for speech difficulty.  Psychiatric/Behavioral:  Negative for confusion.    Physical Exam Updated Vital Signs BP 121/73   Pulse 73   Temp 98.4 F (36.9 C) (Oral)   Resp (!) 21   LMP 05/03/2021   SpO2 100%   Physical Exam Constitutional:      General: She is not in acute distress.    Appearance: Normal appearance. She is not ill-appearing, toxic-appearing or diaphoretic.  HENT:     Mouth/Throat:     Mouth: Mucous membranes are moist.     Pharynx: Oropharynx is clear.  Eyes:     General: No scleral icterus.    Extraocular Movements: Extraocular movements intact.     Pupils: Pupils are equal, round, and reactive to light.  Cardiovascular:     Rate and Rhythm: Normal rate and regular rhythm.     Pulses: Normal pulses.     Heart sounds: Normal heart sounds.  Pulmonary:     Effort: Pulmonary effort is normal. No respiratory distress.     Breath sounds: Normal breath sounds. No stridor. No wheezing, rhonchi or rales.  Chest:     Chest  wall: No tenderness.  Abdominal:     General: Abdomen is flat. There is no distension.     Palpations: Abdomen is soft.     Tenderness: There is no abdominal tenderness. There is no guarding or rebound.  Musculoskeletal:        General: No swelling or tenderness. Normal range of motion.     Cervical back: Normal range of motion and neck supple. No rigidity.     Right lower leg: No edema.     Left lower leg: No edema.  Skin:    General: Skin is warm and dry.     Capillary Refill: Capillary refill takes less than 2 seconds.     Coloration: Skin is not pale.  Neurological:     General: No focal deficit present.     Mental Status: She is alert and oriented to person, place, and time.  Comments: Alert. Clear speech, however patient does have some difficulty finding words infrequently.. No facial droop.  Does have some minor tongue fasciculations.  Subjective facial numbness on left face compared to right side.  CNIII-XII grossly intact. Bilateral upper and lower extremities' sensation grossly intact. 3/5 symmetric strength with grip strength and with plantar and dorsi flexion bilaterally.  Left side is subtly more weak than right side on upper and lower extremity.  BilateraL fingers are contracted.  Radial pulse 2+.  DTRs are absent in lower extremities.  Patient does have positive pronator drift bilaterally.     Psychiatric:        Mood and Affect: Mood normal.        Behavior: Behavior normal.    ED Results / Procedures / Treatments   Labs (all labs ordered are listed, but only abnormal results are displayed) Labs Reviewed  COMPREHENSIVE METABOLIC PANEL - Abnormal; Notable for the following components:      Result Value   Chloride 112 (*)    CO2 18 (*)    BUN 1 (*)    Calcium 7.8 (*)    Total Protein 6.0 (*)    All other components within normal limits  RESP PANEL BY RT-PCR (FLU A&B, COVID) ARPGX2  CBC WITH DIFFERENTIAL/PLATELET  MAGNESIUM  SEDIMENTATION RATE  C-REACTIVE  PROTEIN  URINALYSIS, ROUTINE W REFLEX MICROSCOPIC  PREGNANCY, URINE  RAPID URINE DRUG SCREEN, HOSP PERFORMED  TROPONIN I (HIGH SENSITIVITY)  TROPONIN I (HIGH SENSITIVITY)    EKG EKG Interpretation  Date/Time:  Tuesday May 28 2021 17:53:58 EDT Ventricular Rate:  91 PR Interval:  128 QRS Duration: 70 QT Interval:  406 QTC Calculation: 499 R Axis:   80 Text Interpretation: Normal sinus rhythm Nonspecific ST and T wave abnormality Prolonged QT Abnormal ECG Confirmed by Lorre Nick ([id number]) on 05/28/2021 6:36:00 PM  Radiology MR Brain W and Wo Contrast  Result Date: 05/28/2021 CLINICAL DATA:  Left facial numbness EXAM: MRI HEAD WITHOUT AND WITH CONTRAST TECHNIQUE: Multiplanar, multiecho pulse sequences of the brain and surrounding structures were obtained without and with intravenous contrast. CONTRAST:  43mL GADAVIST GADOBUTROL 1 MMOL/ML IV SOLN COMPARISON:  None. FINDINGS: Brain: No acute infarct, mass effect or extra-axial collection. No acute or chronic hemorrhage. Normal white matter signal, parenchymal volume and CSF spaces. The midline structures are normal. Vascular: Major flow voids are preserved. Skull and upper cervical spine: Normal calvarium and skull base. Visualized upper cervical spine and soft tissues are normal. Sinuses/Orbits:No paranasal sinus fluid levels or advanced mucosal thickening. No mastoid or middle ear effusion. Normal orbits. IMPRESSION: Normal brain MRI. Electronically Signed   By: Deatra Robinson M.D.   On: 05/28/2021 23:06   MR Cervical Spine W or Wo Contrast  Result Date: 05/28/2021 CLINICAL DATA:  Left facial numbness and hand contracting. Migraine. EXAM: MRI CERVICAL SPINE WITHOUT AND WITH CONTRAST TECHNIQUE: Multiplanar and multiecho pulse sequences of the cervical spine, to include the craniocervical junction and cervicothoracic junction, were obtained without and with intravenous contrast. CONTRAST:  74mL GADAVIST GADOBUTROL 1 MMOL/ML IV SOLN COMPARISON:   None. FINDINGS: Alignment: Physiologic. Vertebrae: No fracture, evidence of discitis, or bone lesion. Cord: Normal signal and morphology. Posterior Fossa, vertebral arteries, paraspinal tissues: Negative. Disc levels: No spinal canal or neural foraminal stenosis. IMPRESSION: Normal MRI of the cervical spine. Electronically Signed   By: Deatra Robinson M.D.   On: 05/28/2021 23:13   DG Chest Port 1 View  Result Date: 05/28/2021 CLINICAL DATA:  Shortness of  breath, left arm numbness and tingling. EXAM: PORTABLE CHEST 1 VIEW COMPARISON:  03/19/2021 FINDINGS: The heart size and mediastinal contours are within normal limits. Both lungs are clear. The visualized skeletal structures are unremarkable. IMPRESSION: No active disease. Electronically Signed   By: Burman Nieves M.D.   On: 05/28/2021 19:45    Procedures .Lumbar Puncture  Date/Time: 05/29/2021 12:52 AM Performed by: Farrel Gordon, PA-C Authorized by: Farrel Gordon, PA-C   Consent:    Consent obtained:  Verbal   Consent given by:  Patient   Risks, benefits, and alternatives were discussed: yes     Risks discussed:  Bleeding, infection, pain, headache, nerve damage and repeat procedure   Alternatives discussed:  No treatment, delayed treatment and alternative treatment Universal protocol:    Procedure explained and questions answered to patient or proxy's satisfaction: yes     Relevant documents present and verified: yes     Test results available: yes     Immediately prior to procedure a time out was called: yes     Site/side marked: yes     Patient identity confirmed:  Verbally with patient Pre-procedure details:    Procedure purpose:  Diagnostic   Preparation: Patient was prepped and draped in usual sterile fashion   Sedation:    Sedation type:  None Anesthesia:    Anesthesia method:  Local infiltration   Local anesthetic:  Lidocaine 1% w/o epi Procedure details:    Lumbar space:  L3-L4 interspace   Patient position:  Sitting    Ultrasound guidance: no     Number of attempts:  1   Fluid appearance:  Clear   Tubes of fluid:  4   Total volume (ml):  7 Post-procedure details:    Puncture site:  Adhesive bandage applied and direct pressure applied   Procedure completion:  Tolerated well, no immediate complications   Medications Ordered in ED Medications  LORazepam (ATIVAN) injection 1 mg (1 mg Intravenous Given 05/28/21 1817)  sodium chloride 0.9 % bolus 1,000 mL (0 mLs Intravenous Stopped 05/28/21 2015)  gadobutrol (GADAVIST) 1 MMOL/ML injection 5 mL (5 mLs Intravenous Contrast Given 05/28/21 2220)    ED Course  I have reviewed the triage vital signs and the nursing notes.  Pertinent labs & imaging results that were available during my care of the patient were reviewed by me and considered in my medical decision making (see chart for details).    MDM Rules/Calculators/A&P                          Audrey [name] is a 40 y.o. female with pertinent past medical history of lupus on methotrexate and Plaquenil, migraines on Topamax that presents to the emerge department today for left-sided headache, left-sided facial numbness, bilateral hand contracting and weakness on her bilateral upper and lower extremities that began today.   Patient with abnormal neurology findings as stated above, consulted neurology.   Work-up today unremarkable with normal electrolytes, MRI head and cervical spine without any acute abnormalities.  Neurology, Dr. Larina Earthly, recommends IVIG and medical admission at this time.  Recommends LP in the ED.  Discussed risks with patient about LP, neurology also discussed risks of LP.  Patient agreeable for LP at this time, translator used.  Patient prepped and draped sterilely.  LP was successful, Dr. Manus Gunning did supervise me.  Patient placed in supine position.   The patient appears reasonably stabilized for admission considering the current resources, flow, and capabilities  available in the  ED at this time, and [facility] and/or treatment in the ED prior to admission.  Patient admitted to Dr. Alinda MoneyMelvin.  Final Clinical Impression(s) / ED Diagnoses Final diagnoses:  Weakness    Rx / DC Orders ED Discharge Orders     None           Farrel Gordonatel, Marshall Kampf, PA-C 05/29/21 0101    Lorre NickAllen, Anthony, MD 05/29/21 1553

## 2021-05-28 NOTE — ED Provider Notes (Signed)
I provided a substantive portion of the care of this patient.  I personally performed the entirety of the medical decision making for this encounter.  EKG Interpretation  Date/Time:  Tuesday May 28 2021 17:53:58 EDT Ventricular Rate:  91 PR Interval:  128 QRS Duration: 70 QT Interval:  406 QTC Calculation: 499 R Axis:   80 Text Interpretation: Normal sinus rhythm Nonspecific ST and T wave abnormality Prolonged QT Abnormal ECG Confirmed by Lorre Nick ([id number]) on 05/28/2021 6:39:28 PM   40 year old female with history of lupus presents with multiple paresthesias in her face and lower extremities.  Exam shows some coolness to touch of bilateral feet but neurovascular intact.  Neurology is seeing patient this time.  Unclear etiology of her symptoms.  Will require admission   Lorre Nick, MD 05/28/21 2306

## 2021-05-29 ENCOUNTER — Inpatient Hospital Stay (HOSPITAL_COMMUNITY): Payer: Self-pay

## 2021-05-29 DIAGNOSIS — R29818 Other symptoms and signs involving the nervous system: Secondary | ICD-10-CM | POA: Diagnosis present

## 2021-05-29 DIAGNOSIS — M329 Systemic lupus erythematosus, unspecified: Secondary | ICD-10-CM

## 2021-05-29 DIAGNOSIS — K219 Gastro-esophageal reflux disease without esophagitis: Secondary | ICD-10-CM

## 2021-05-29 DIAGNOSIS — R519 Headache, unspecified: Secondary | ICD-10-CM

## 2021-05-29 LAB — URINALYSIS, ROUTINE W REFLEX MICROSCOPIC
Bacteria, UA: NONE SEEN
Bilirubin Urine: NEGATIVE
Glucose, UA: NEGATIVE mg/dL
Ketones, ur: NEGATIVE mg/dL
Leukocytes,Ua: NEGATIVE
Nitrite: NEGATIVE
Protein, ur: NEGATIVE mg/dL
Specific Gravity, Urine: 1.002 — ABNORMAL LOW (ref 1.005–1.030)
pH: 8 (ref 5.0–8.0)

## 2021-05-29 LAB — CBC
HCT: 31.2 % — ABNORMAL LOW (ref 36.0–46.0)
Hemoglobin: 11 g/dL — ABNORMAL LOW (ref 12.0–15.0)
MCH: 30.6 pg (ref 26.0–34.0)
MCHC: 35.3 g/dL (ref 30.0–36.0)
MCV: 86.9 fL (ref 80.0–100.0)
Platelets: 151 10*3/uL (ref 150–400)
RBC: 3.59 MIL/uL — ABNORMAL LOW (ref 3.87–5.11)
RDW: 13.4 % (ref 11.5–15.5)
WBC: 3.2 10*3/uL — ABNORMAL LOW (ref 4.0–10.5)
nRBC: 0 % (ref 0.0–0.2)

## 2021-05-29 LAB — CSF CELL COUNT WITH DIFFERENTIAL
Eosinophils, CSF: 0 % (ref 0–1)
Eosinophils, CSF: 0 % (ref 0–1)
Lymphs, CSF: 0 % — ABNORMAL LOW (ref 40–80)
Lymphs, CSF: 0 % — ABNORMAL LOW (ref 40–80)
Monocyte-Macrophage-Spinal Fluid: 0 % — ABNORMAL LOW (ref 15–45)
Monocyte-Macrophage-Spinal Fluid: 0 % — ABNORMAL LOW (ref 15–45)
RBC Count, CSF: 0 /mm3
RBC Count, CSF: 0 /mm3
Segmented Neutrophils-CSF: 0 % (ref 0–6)
Segmented Neutrophils-CSF: 0 % (ref 0–6)
Tube #: 1
Tube #: 4
WBC, CSF: 0 /mm3 (ref 0–5)
WBC, CSF: 0 /mm3 (ref 0–5)

## 2021-05-29 LAB — RAPID URINE DRUG SCREEN, HOSP PERFORMED
Amphetamines: NOT DETECTED
Barbiturates: NOT DETECTED
Benzodiazepines: NOT DETECTED
Cocaine: NOT DETECTED
Opiates: NOT DETECTED
Tetrahydrocannabinol: NOT DETECTED

## 2021-05-29 LAB — PROTEIN AND GLUCOSE, CSF
Glucose, CSF: 53 mg/dL (ref 40–70)
Total  Protein, CSF: 19 mg/dL (ref 15–45)

## 2021-05-29 LAB — PREGNANCY, URINE: Preg Test, Ur: NEGATIVE

## 2021-05-29 LAB — BASIC METABOLIC PANEL
Anion gap: 9 (ref 5–15)
BUN: <5 mg/dL — ABNORMAL LOW (ref 6–20)
CO2: 18 mmol/L — ABNORMAL LOW (ref 22–32)
Calcium: 8 mg/dL — ABNORMAL LOW (ref 8.9–10.3)
Chloride: 112 mmol/L — ABNORMAL HIGH (ref 98–111)
Creatinine, Ser: 0.57 mg/dL (ref 0.44–1.00)
GFR, Estimated: >60 mL/min (ref >60)
Glucose, Bld: 86 mg/dL (ref 70–99)
Potassium: 3.1 mmol/L — ABNORMAL LOW (ref 3.5–5.1)
Sodium: 139 mmol/L (ref 135–145)

## 2021-05-29 LAB — VITAMIN B12: Vitamin B-12: 1145 pg/mL — ABNORMAL HIGH (ref 180–914)

## 2021-05-29 LAB — TROPONIN I (HIGH SENSITIVITY): Troponin I (High Sensitivity): 3 ng/L (ref <18)

## 2021-05-29 LAB — FOLATE: Folate: 35.7 ng/mL (ref >5.9)

## 2021-05-29 LAB — RESP PANEL BY RT-PCR (FLU A&B, COVID) ARPGX2
Influenza A by PCR: NEGATIVE
Influenza B by PCR: NEGATIVE
SARS Coronavirus 2 by RT PCR: NEGATIVE

## 2021-05-29 LAB — HIV ANTIBODY (ROUTINE TESTING W REFLEX): HIV Screen 4th Generation wRfx: NONREACTIVE

## 2021-05-29 LAB — RPR: RPR Ser Ql: NONREACTIVE

## 2021-05-29 LAB — TSH: TSH: 1.607 u[IU]/mL (ref 0.350–4.500)

## 2021-05-29 MED ORDER — HYDROXYCHLOROQUINE SULFATE 200 MG PO TABS
400.0000 mg | ORAL_TABLET | Freq: Every day | ORAL | Status: DC
  Administered 2021-05-29 – 2021-06-03 (×6): 400 mg via ORAL
  Filled 2021-05-29 (×6): qty 2

## 2021-05-29 MED ORDER — LIDOCAINE HCL (PF) 1 % IJ SOLN
30.0000 mL | Freq: Once | INTRAMUSCULAR | Status: AC
  Administered 2021-05-29: 30 mL
  Filled 2021-05-29: qty 30

## 2021-05-29 MED ORDER — THIAMINE HCL 100 MG/ML IJ SOLN
250.0000 mg | Freq: Every day | INTRAVENOUS | Status: DC
  Administered 2021-06-01 – 2021-06-02 (×2): 250 mg via INTRAVENOUS
  Filled 2021-05-29 (×4): qty 2.5

## 2021-05-29 MED ORDER — ACETAMINOPHEN 650 MG RE SUPP: 650.0000 mg | Freq: Four times a day (QID) | RECTAL | Status: DC | PRN

## 2021-05-29 MED ORDER — ACETAMINOPHEN 325 MG PO TABS
650.0000 mg | ORAL_TABLET | Freq: Four times a day (QID) | ORAL | Status: DC | PRN
  Administered 2021-05-29 – 2021-06-03 (×10): 650 mg via ORAL
  Filled 2021-05-29 (×10): qty 2

## 2021-05-29 MED ORDER — POLYETHYLENE GLYCOL 3350 17 G PO PACK: 17.0000 g | PACK | Freq: Every day | ORAL | Status: DC | PRN

## 2021-05-29 MED ORDER — THIAMINE HCL 100 MG/ML IJ SOLN: 100.0000 mg | Freq: Every day | INTRAMUSCULAR | Status: DC

## 2021-05-29 MED ORDER — ENOXAPARIN SODIUM 40 MG/0.4ML IJ SOSY
40.0000 mg | PREFILLED_SYRINGE | INTRAMUSCULAR | Status: DC
  Administered 2021-05-29 – 2021-06-02 (×5): 40 mg via SUBCUTANEOUS
  Filled 2021-05-29 (×5): qty 0.4

## 2021-05-29 MED ORDER — IMMUNE GLOBULIN (HUMAN) 10 GM/100ML IV SOLN
400.0000 mg/kg | Freq: Every day | INTRAVENOUS | Status: DC
  Administered 2021-05-29 – 2021-05-30 (×2): 20 g via INTRAVENOUS
  Filled 2021-05-29 (×3): qty 200

## 2021-05-29 MED ORDER — MAGNESIUM SULFATE IN D5W 1-5 GM/100ML-% IV SOLN
1.0000 g | Freq: Once | INTRAVENOUS | Status: AC
  Administered 2021-05-29: 1 g via INTRAVENOUS
  Filled 2021-05-29: qty 100

## 2021-05-29 MED ORDER — THIAMINE HCL 100 MG/ML IJ SOLN
500.0000 mg | Freq: Three times a day (TID) | INTRAVENOUS | Status: AC
  Administered 2021-05-29 – 2021-05-30 (×5): 500 mg via INTRAVENOUS
  Filled 2021-05-29 (×7): qty 5

## 2021-05-29 MED ORDER — DEXTROSE 5 % IV BOLUS
500.0000 mL | Freq: Every day | INTRAVENOUS | Status: DC
  Administered 2021-05-29: 500 mL via INTRAVENOUS

## 2021-05-29 MED ORDER — POTASSIUM CHLORIDE CRYS ER 20 MEQ PO TBCR
40.0000 meq | EXTENDED_RELEASE_TABLET | Freq: Two times a day (BID) | ORAL | Status: AC
  Administered 2021-05-29 (×2): 40 meq via ORAL
  Filled 2021-05-29 (×2): qty 2

## 2021-05-29 MED ORDER — SODIUM CHLORIDE 0.9% FLUSH
3.0000 mL | Freq: Two times a day (BID) | INTRAVENOUS | Status: DC
  Administered 2021-05-29 – 2021-06-02 (×6): 3 mL via INTRAVENOUS

## 2021-05-29 MED ORDER — ENOXAPARIN SODIUM 30 MG/0.3ML IJ SOSY: 30.0000 mg | PREFILLED_SYRINGE | INTRAMUSCULAR | Status: DC

## 2021-05-29 MED ORDER — PANTOPRAZOLE SODIUM 40 MG PO TBEC
40.0000 mg | DELAYED_RELEASE_TABLET | Freq: Every day | ORAL | Status: DC
  Administered 2021-05-29 – 2021-06-02 (×5): 40 mg via ORAL
  Filled 2021-05-29 (×5): qty 1

## 2021-05-29 MED ORDER — TOPIRAMATE 25 MG PO TABS
75.0000 mg | ORAL_TABLET | Freq: Every day | ORAL | Status: DC
  Administered 2021-05-29 – 2021-06-02 (×5): 75 mg via ORAL
  Filled 2021-05-29 (×5): qty 3

## 2021-05-29 NOTE — ED Notes (Addendum)
Informed pt of needed UA and provided pt with a urine specimen

## 2021-05-29 NOTE — Progress Notes (Signed)
Neurology Progress Note  Brief HPI: 40 y.o. 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  Subjective: No acute overnight events.  First dose of IVIG (1/5) completed early this morning without adverse events Endorses ongoing bilateral facial and hand numbness  Exam: Vitals:   05/29/21 0730 05/29/21 0745  BP: 103/61 120/71  Pulse: 60 65  Resp: 20 19  Temp:    SpO2: 100% 100%   Gen: Laying comfortably in ER stretcher, in no acute distress Resp: non-labored breathing, no respiratory distress on room air Abd: soft, non-tender, non-distended  Neuro: Mental Status: Awake, alert, and oriented to person, place, time, and situation. Good attention.  She is able to provide a clear and coherent history of present illness via video interpreter assistance.  Speech is intact without dysarthria or aphasia. Naming, fluency, and comprehension are intact.  No neglect is noted. Cranial Nerves: Pupils are equal, round, and briskly reactive to light bilaterally, EOMI without ptosis or gaze preference, facial sensation decreased to light touch symmetrically, face is symmetric resting and with movement, hearing is intact to voice, palate elevates symmetrically, shoulders shrug symmetrically, tongue protrudes midline Motor: Generalized weakness noted throughout.  Bilateral upper extremities with 4-/5 strength present biceps, triceps, and deltoids.  Bilateral lower extremities with 4+/5 strength present with knee flexion and extension, with distal worse than proximal weakness. 3/5 strength present in bilateral ankles with dorsi/plantar  flexion.  Sensory: Sensation is intact to light touch on bilateral upper extremities with less sensation reported to the left upper extremity. Decreased sensation to light touch also reported on the left lower extremity that is length dependent. Decreased sensation to cool temperature noted bilaterally throughout with cooler temperatures felt on the right upper and lower extremities compared to the left.  DTR: Absent bilateral patellae, achilles, and biceps. 1+ and symmetric bilateral brachioradialis  Coordination: Slowed movements throughout but without ataxic movements out of proportion to weakness.  Plantars: Mute bilaterally Gait: Deferred  Pertinent Labs: CBC    Component Value Date/Time   WBC 3.2 (L) 05/29/2021 0520   RBC 3.59 (L) 05/29/2021 0520   HGB 11.0 (L) 05/29/2021 0520   HGB 13.3 05/16/2021 1601   HCT 31.2 (L) 05/29/2021 0520   HCT 39.3 05/16/2021 1601   PLT 151 05/29/2021 0520   PLT 198 05/16/2021 1601   MCV 86.9 05/29/2021 0520   MCV 87 05/16/2021 1601   MCH 30.6 05/29/2021 0520   MCHC 35.3 05/29/2021 0520   RDW 13.4 05/29/2021 0520   RDW 14.1 05/16/2021 1601   LYMPHSABS 0.9 05/28/2021 1907   LYMPHSABS 1.2 05/16/2021 1601   MONOABS 0.3 05/28/2021 1907   EOSABS 0.0 05/28/2021 1907   EOSABS 0.0 05/16/2021 1601   BASOSABS 0.0 05/28/2021 1907   BASOSABS 0.0 05/16/2021 1601   CMP     Component Value Date/Time   NA 139 05/29/2021 0316   NA 133 (L) 05/15/2021 1253   K 3.1 (L) 05/29/2021 0316   CL 112 (H) 05/29/2021 0316   CO2 18 (L) 05/29/2021 0316   GLUCOSE 86 05/29/2021 0316   BUN <5 (L) 05/29/2021 0316   BUN 3 (L) 05/15/2021 1253  CREATININE 0.57 05/29/2021 0316   CALCIUM 8.0 (L) 05/29/2021 0316   PROT 6.0 (L) 05/28/2021 1907   PROT 7.3 05/15/2021 1253   ALBUMIN 3.5 05/28/2021 1907   ALBUMIN 4.8 05/15/2021 1253   AST 36 05/28/2021 1907   ALT 15 05/28/2021 1907   ALKPHOS 50 05/28/2021 1907   BILITOT 0.7 05/28/2021 1907   BILITOT 0.3 05/15/2021 1253    GFRNONAA >60 05/29/2021 0316   GFRAA 134 02/16/2020 1000   Lab Results  Component Value Date   VITAMINB12 1,145 (H) 05/29/2021   Lab Results  Component Value Date   TSH 1.607 05/29/2021   Results for LAVETTA, GEIER (MRN [id number]) as of 05/29/2021 08:36  Ref. Range 05/29/2021 01:05 05/29/2021 01:05  Appearance, CSF Latest Ref Range: CLEAR  CLEAR CLEAR  Glucose, CSF Latest Ref Range: 40 - 70 mg/dL 53   RBC Count, CSF Latest Ref Range: 0 /cu mm 0 0  WBC, CSF Latest Ref Range: 0 - 5 /cu mm 0 0  Segmented Neutrophils-CSF Latest Ref Range: 0 - 6 % 0 0  Lymphs, CSF Latest Ref Range: 40 - 80 % 0 (L) 0 (L)  Monocyte-Macrophage-Spinal Fluid Latest Ref Range: 15 - 45 % 0 (L) 0 (L)  Eosinophils, CSF Latest Ref Range: 0 - 1 % 0 0  Other Cells, CSF Unknown TOO FEW TO COUNT, SMEAR AVAILABLE FOR REVIEW TOO FEW TO COUNT, SMEAR AVAILABLE FOR REVIEW  Color, CSF Latest Ref Range: COLORLESS  COLORLESS COLORLESS  Supernatant Unknown NOT INDICATED NOT INDICATED  Total  Protein, CSF Latest Ref Range: 15 - 45 mg/dL 19   Tube # Unknown 1 4   Gram Stain WBC PRESENT, PREDOMINANTLY MONONUCLEAR  NO ORGANISMS SEEN  CYTOSPIN SMEAR    Folate: 35.7  Imaging Reviewed:  MR C spine with and without contrast 05/28/2021: No acute abnormalities.    MRI Brain with and without contrast 05/28/2021: No acute abnormalities  Assessment: 39 y.o. female who presented 05/28/2021 with gradually progressive bilateral hand weakness and numbness, weakness and numbness in BLE left worse than right over 5-6 weeks with examination revealing distal worse than proximal weakness in all extremities with sensory deficits to light touch and temperature and areflexia in BLE with hyporeflexia in BUE.  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 Her neuropathy is felt to be related to her systemic  rheumatological illness versus nutritional component with poor PO intake 2/2 nausea versus polyradiculitis. Sjogren's is less likely despite SSA+ due to weakness in addition to sensory disturbances.  - With ongoing concern for CIDP, patient was started on IVIG x 5 doses. First dose 05/29/2021 to be completed 06/02/2021.   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  Her history and exam are quite puzzling, she describes a left hemisensory loss that is longer standing as well as some right-sided involvement that  is more recent.  She clearly is areflexic with a proximal greater than distal weakness.  This is suggestive of a neuropathic process, however without elevation in protein, I am not certain that CIDP or AIDP are definite by any means.  A myopathic process could also be possible, though with complete areflexia this is also less likely.  With her history of autoimmune disease, I think that an autoimmune etiology is by far the most likely, other work-up as above.  I agree with treating as an autoimmune process with IVIG at the current time, we will monitor for improvement as well as assessing for a myopathy/myositis with CK as well as MRI of the thigh.  Ritta Slot, MD [facility] [phone]  If 7pm- 7am, please page neurology on call as listed in AMION.

## 2021-05-29 NOTE — H&P (Signed)
History and Physical   Audrey [name] DXI:[id number] DOB: Oct 11, 1981 DOA: 05/28/2021  PCP: Mack Hook, MD  Patient coming from: Home  Chief Complaint: New onset weakness and numbness  HPI: Audrey [name] is a 40 y.o. female with medical history significant of lupus, GERD, arthritis, migraine, sinus tachycardia, anemia who presents with new onset numbness and weakness. History obtained with the assistance of translator.  Patient presenting with 1 day of headache with associated left-sided facial numbness, bilateral hand and leg weakness with contraction of hands.  Has had some intermittent numbness with migraines for months.  Has a history of some numbness with the headaches in April but never having this weakness or contractions before.  Also reporting some nausea and feeling fatigued. States she has also had some episodes of lightheadedness. Patient denies fevers, chills, chest pain, shortness of breath, abdominal pain, constipation, diarrhea.  ED Course: Vital signs in the ED are stable.  Lab work-up showed CMP with chloride 112, bicarb 18, glucose 112, calcium 7.8, protein 6.  CBC within normal limits.  Troponin normal.  ESR normal.  CRP normal.  Respiratory panel flu COVID pending.  Urinalysis and UDS pending.  Pregnancy test pending.  Magnesium normal.  CSF studies have been ordered and are pending which include cell count, Gram stain, culture, protein, IgG, oligoclonal bands, ACE, HSV, VDRL.  Lyme studies ordered.  Chest x-ray without acute abnormality.  MR brain and MR C-spine without acute abnormality.  Neurology seen the patient in the ED and or studies and are guiding work-up and treatment.    Review of Systems: As per HPI otherwise all other systems reviewed and are negative.   Past Medical History:  Diagnosis Date   Lupus ([place])    Symptomatic anemia 12/03/2018    Past Surgical History:  Procedure Laterality Date   DILATION AND CURETTAGE OF UTERUS  2007    following [facility]   LAPAROSCOPIC CHOLECYSTECTOMY  2009    Social History  reports that she has never smoked. She has never used smokeless tobacco. She reports that she does not drink alcohol and does not use drugs.  No Known Allergies  Family History  Problem Relation Age of Onset   Cancer Mother        Esophageal.   Diabetes Mother    Hypertension Father    Irritable bowel syndrome Sister    Hyperlipidemia Son   Reviewed on admission  Prior to Admission medications   Medication Sig Start Date End Date Taking? Authorizing Provider  calcium-vitamin D (OSCAL WITH D) 500-200 MG-UNIT tablet Take 2 tablets by mouth daily with breakfast. 12/05/18   Dorrell, Andree Elk, MD  ferrous gluconate (FERGON) 324 MG tablet Take 1 tablet (324 mg total) by mouth 2 (two) times daily with a meal. 03/05/20   Mack Hook, MD  folic acid (FOLVITE) 1 MG tablet Take 1 mg by mouth daily.    [provider]  hydroxychloroquine (PLAQUENIL) 200 MG tablet 2 tabs by mouth once daily 10/19/18   Mack Hook, MD  ibuprofen (ADVIL,MOTRIN) 200 MG tablet Take 400 mg by mouth every 6 (six) hours as needed for fever.     [provider]  levonorgestrel (MIRENA) 20 MCG/24HR IUD 1 each by Intrauterine route once.    [provider]  methotrexate (RHEUMATREX) 2.5 MG tablet Take 2.5 mg by mouth once a week. Take 8 tablets by mouth once a week Patient not taking: Reported on 05/16/2021    [provider]  omeprazole (PRILOSEC) 40 MG  capsule 1 cap by mouth 1 hour before breakfast daily 05/16/21   Mack Hook, MD  topiramate (TOPAMAX) 25 MG tablet 3 tabs by mouth at bedtime 05/07/21   Mack Hook, MD    Physical Exam: Vitals:   05/28/21 2045 05/28/21 2100 05/28/21 2115 05/29/21 0000  BP: 113/71 111/72 121/73 120/76  Pulse: 76 83 73 71  Resp: (!) 21 18 (!) 21 14  Temp:      TempSrc:      SpO2: 100% 100% 100% 100%   Physical Exam Constitutional:      General:  She is not in acute distress.    Appearance: Normal appearance.  HENT:     Head: Normocephalic and atraumatic.     Mouth/Throat:     Mouth: Mucous membranes are moist.     Pharynx: Oropharynx is clear.  Eyes:     Extraocular Movements: Extraocular movements intact.     Pupils: Pupils are equal, round, and reactive to light.  Cardiovascular:     Rate and Rhythm: Normal rate and regular rhythm.     Pulses: Normal pulses.     Heart sounds: Normal heart sounds.  Pulmonary:     Effort: Pulmonary effort is normal. No respiratory distress.     Breath sounds: Normal breath sounds.  Abdominal:     General: Bowel sounds are normal. There is no distension.     Palpations: Abdomen is soft.     Tenderness: There is no abdominal tenderness.  Musculoskeletal:        General: No swelling or deformity.  Skin:    General: Skin is warm and dry.  Neurological:     Comments: Patient is awake, alert, oriented No signs of aphasia or neglect Unable to perform full neurologic exam as she just had her LP performed.  Hands remain contracted.    Labs on Admission: I have personally reviewed following labs and imaging studies  CBC: Recent Labs  Lab 05/28/21 1907  WBC 5.7  NEUTROABS 4.5  HGB 12.2  HCT 36.1  MCV 88.9  PLT 916    Basic Metabolic Panel: Recent Labs  Lab 05/28/21 1907 05/28/21 1925  NA 138  --   K 3.8  --   CL 112*  --   CO2 18*  --   GLUCOSE 93  --   BUN 1*  --   CREATININE 0.58  --   CALCIUM 7.8*  --   MG  --  1.8    GFR: Estimated Creatinine Clearance: 72.1 mL/min (by C-G formula based on SCr of 0.58 mg/dL).  Liver Function Tests: Recent Labs  Lab 05/28/21 1907  AST 36  ALT 15  ALKPHOS 50  BILITOT 0.7  PROT 6.0*  ALBUMIN 3.5    Urine analysis:    Component Value Date/Time   COLORURINE YELLOW 12/01/2018 1956   APPEARANCEUR CLEAR 12/01/2018 1956   LABSPEC 1.013 12/01/2018 1956   PHURINE 6.0 12/01/2018 1956   GLUCOSEU NEGATIVE 12/01/2018 1956   HGBUR  NEGATIVE 12/01/2018 1956   HGBUR negative 06/03/2010 1418   BILIRUBINUR neg 11/30/2019 1432   KETONESUR NEGATIVE 12/01/2018 1956   PROTEINUR Negative 11/30/2019 1432   PROTEINUR NEGATIVE 12/01/2018 1956   UROBILINOGEN 0.2 11/30/2019 1432   UROBILINOGEN 0.2 06/03/2010 1418   NITRITE neg 11/30/2019 1432   NITRITE NEGATIVE 12/01/2018 1956   LEUKOCYTESUR Moderate (2+) (A) 11/30/2019 1432    Radiological Exams on Admission: MR Brain W and Wo Contrast  Result Date: 05/28/2021 CLINICAL DATA:  Left  facial numbness EXAM: MRI HEAD WITHOUT AND WITH CONTRAST TECHNIQUE: Multiplanar, multiecho pulse sequences of the brain and surrounding structures were obtained without and with intravenous contrast. CONTRAST:  2m GADAVIST GADOBUTROL 1 MMOL/ML IV SOLN COMPARISON:  None. FINDINGS: Brain: No acute infarct, mass effect or extra-axial collection. No acute or chronic hemorrhage. Normal white matter signal, parenchymal volume and CSF spaces. The midline structures are normal. Vascular: Major flow voids are preserved. Skull and upper cervical spine: Normal calvarium and skull base. Visualized upper cervical spine and soft tissues are normal. Sinuses/Orbits:No paranasal sinus fluid levels or advanced mucosal thickening. No mastoid or middle ear effusion. Normal orbits. IMPRESSION: Normal brain MRI. Electronically Signed   By: KUlyses JarredM.D.   On: 05/28/2021 23:06   MR Cervical Spine W or Wo Contrast  Result Date: 05/28/2021 CLINICAL DATA:  Left facial numbness and hand contracting. Migraine. EXAM: MRI CERVICAL SPINE WITHOUT AND WITH CONTRAST TECHNIQUE: Multiplanar and multiecho pulse sequences of the cervical spine, to include the craniocervical junction and cervicothoracic junction, were obtained without and with intravenous contrast. CONTRAST:  522mGADAVIST GADOBUTROL 1 MMOL/ML IV SOLN COMPARISON:  None. FINDINGS: Alignment: Physiologic. Vertebrae: No fracture, evidence of discitis, or bone lesion. Cord: Normal  signal and morphology. Posterior Fossa, vertebral arteries, paraspinal tissues: Negative. Disc levels: No spinal canal or neural foraminal stenosis. IMPRESSION: Normal MRI of the cervical spine. Electronically Signed   By: KeUlyses Jarred.D.   On: 05/28/2021 23:13   DG Chest Port 1 View  Result Date: 05/28/2021 CLINICAL DATA:  Shortness of breath, left arm numbness and tingling. EXAM: PORTABLE CHEST 1 VIEW COMPARISON:  03/19/2021 FINDINGS: The heart size and mediastinal contours are within normal limits. Both lungs are clear. The visualized skeletal structures are unremarkable. IMPRESSION: No active disease. Electronically Signed   By: WiLucienne Capers.D.   On: 05/28/2021 19:45    EKG: Independently reviewed.  Normal sinus rhythm at 91 bpm.  Some nonspecific T wave abnormalities with T wave flattening.  QTC prolonged at 499.  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 > Patient has had some numbness and weakness with headache in the past but never weakness to this extent nor contraction like this. > Has been seen by neurology in the ED who is working patient up with CSF studies.  MRI brain and C-spine has been negative.  Neurology plans for IVIG treatment per EDP. 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  Less suspicion for GBS per neurology due to is unlikely to worsen past with 3-week period.  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 - Appreciate neurology recommendations - Monitor on telemetry - Follow-up CSF studies - Plan is for IVIG - Also checking vitamin B1, B6, B12, MMA, folate, RPR, TSH, SPEP - Vit B1 IV -  Plan to start multivitamin after levels have been checked. - Further treatment per neurology  Lupus > Reports she has been off methotrexate for 2 weeks. - Continue home Plaquenil  Migraine - Continue home Topamax  GERD - Continue PPI  Prolonged QT - QTC prolonged at 499 - Avoid additional QT prolonging medications  DVT prophylaxis: Lovenox  Code Status:   Full  Family Communication:  Husband updated at bedside Disposition Plan:   Patient is from:  Home  Anticipated DC to:  Home  Anticipated DC date:  1 to 5 days  Anticipated DC barriers: None  Consults called:  Neurology, consulted in the ED and are following.   Admission status:  Observation, telemetry   Severity of Illness: The appropriate patient status for this patient is OBSERVATION. Observation status is judged to be reasonable and necessary in order to provide the required intensity of service to ensure the patient's safety. The patient's presenting symptoms, physical exam findings, and initial radiographic and laboratory data in the context of their medical condition is felt to place them at decreased risk for further clinical deterioration. Furthermore, it is anticipated that the patient will be medically stable for discharge from the hospital within 2 midnights of admission. The following factors support the patient status of observation.   " The patient's presenting symptoms include neurologic deficits including left-sided numbness, bilateral hand and leg weakness.. " The physical exam findings include left-sided facial numbness with bilateral hand and leg weakness.. " The initial radiographic and laboratory data are Lab work-up showed CMP with chloride 112, bicarb 18, glucose 112, calcium 7.8, protein 6.  CBC within normal limits.  Troponin normal.  ESR normal.  CRP normal.  Respiratory panel flu COVID pending.  Urinalysis and UDS pending.  Pregnancy test pending.  Magnesium normal.  CSF studies have been ordered and are  pending which include cell count, Gram stain, culture, protein, IgG, oligoclonal bands, ACE, HSV, VDRL.  Lyme studies ordered.  MRI brain and MRI C-spine without acute abnormality.  Chest x-ray without acute abnormality.Marcelyn Bruins MD [facility]  How to contact the [facility] Attending or Consulting provider [place] or covering provider during after hours [place], for this patient?   Check the care team in [facility] and look for a) attending/consulting [facility] provider listed and b) the [facility] team listed Log into [email or web address] and use [place]'s universal password to access. If you do not have the password, please contact the hospital operator. Locate the [facility] provider you are looking for under [facility] and page to a number that you can be directly reached. If you still have difficulty reaching the provider, please page the [facility] (Director on Call) for the Hospitalists listed on amion for assistance.  05/29/2021, 1:19 AM

## 2021-05-29 NOTE — ED Notes (Signed)
Patient transported to MRI 

## 2021-05-29 NOTE — Evaluation (Signed)
Physical Therapy Evaluation Patient Details Name: Audrey [name] MRN: [id number] DOB: 1981-07-20 Today's Date: 05/29/2021   History of Present Illness  Pt is 40 yo female admitted on 05/28/21 with Bil hand weakness and contractures, LE weakness and numbness, and headache.  MRI of brain and C-spine unremarkable.  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 History includes lupus, GERD, arthritis, migraines, and anemia.  Clinical Impression  Pt admitted with above diagnosis. Pt is normally very independent and lives with spouse and 3 children.  She has had difficulty over the past month with ambulation (fatigues easily).  Today , pt able to ambulate 37' but at slower speed with shuffle gait quality and easily fatigued. Pt requiring min A for bed mobility but min guard for all other transfers/gait. She demonstrated significant weakness in bil UE and moderate weakness in bil LE with some decreased sensation In L LE.  Pt has support at home.  She is currently well below her baseline and will benefit from acute PT services.  Pt currently with functional limitations due to the deficits listed below (see PT Problem List). Pt will benefit from skilled PT to increase their independence and safety with mobility to allow discharge to the venue listed below.       Follow Up Recommendations Outpatient PT;Supervision - Intermittent    Equipment Recommendations  None recommended by PT    Recommendations for Other Services       Precautions / Restrictions Precautions Precautions: None      Mobility  Bed Mobility Overal bed mobility: Needs Assistance Bed Mobility: Supine to Sit;Sit to Supine     Supine to sit: Min assist Sit to supine: Supervision   General bed mobility comments: Min A to lift trunk to sit    Transfers Overall transfer level: Needs assistance Equipment used: None Transfers: Sit to/from Stand Sit to Stand: Min guard          General transfer comment: min guard for safety  Ambulation/Gait Ambulation/Gait assistance: Min guard Gait Distance (Feet): 80 Feet Assistive device: None Gait Pattern/deviations: Step-to pattern;Decreased stride length;Shuffle     General Gait Details: Mild shuffle gait but ambulates at slow speed and fatigues easily; min guard for safety  Stairs            Wheelchair Mobility    Modified Rankin (Stroke Patients Only)       Balance Overall balance assessment: Needs assistance Sitting-balance support: No upper extremity supported Sitting balance-Leahy Scale: Good     Standing balance support: No upper extremity supported Standing balance-Leahy Scale: Good Standing balance comment: Ambulates without AD but not further challenged due to fatigue                             Pertinent Vitals/Pain Pain Assessment: No/denies pain    Home Living Family/patient expects to be discharged to:: Private residence Living Arrangements: Spouse/significant other;Children (3 children (10, 40 , 55 yo)) Available Help at Discharge: Family;Available 24 hours/day Type of Home: House Home Access: Level entry     Home Layout: Two level;Able to live on main level with bedroom/bathroom Home Equipment: None      Prior Function Level of Independence: Independent         Comments: Prior to the last month pt was completely independent - has had difficulty walking last 3 weeks (easily fatigues)     Hand Dominance  Extremity/Trunk Assessment   Upper Extremity Assessment Upper Extremity Assessment: LUE deficits/detail;RUE deficits/detail RUE Deficits / Details: ROM WFL - had difficulty opening hand but was able to do, reports improved.  MMT: hand/wrist/elbow 3/5, shoulders 2/5 LUE Deficits / Details: ROM WFL - had difficulty opening hand but was able to do, reports improved.  MMT: hand/wrist/elbow 3/5, shoulders 2/5    Lower Extremity Assessment Lower  Extremity Assessment: LLE deficits/detail;RLE deficits/detail RLE Deficits / Details: ROM WFL; MMT 4/5; slow movements RLE Sensation: WNL LLE Deficits / Details: ROM WFL; MMT 4/5; slow movements LLE Sensation: decreased light touch       Communication   Communication: Prefers language other than [place];Interpreter utilized (Used AMN interpretor [employer] [phone])  Cognition Arousal/Alertness: Awake/alert Behavior During Therapy: WFL for tasks assessed/performed Overall Cognitive Status: Within Functional Limits for tasks assessed                                        General Comments General comments (skin integrity, edema, etc.): VSS    Exercises     Assessment/Plan    PT Assessment Patient needs continued PT services  PT Problem List Decreased strength;Decreased mobility;Decreased activity tolerance;Decreased balance;Impaired sensation;Decreased knowledge of use of DME       PT Treatment Interventions DME instruction;Therapeutic activities;Gait training;Therapeutic exercise;Patient/family education;Modalities;Balance training;Stair training;Functional mobility training    PT Goals (Current goals can be found in the Care Plan section)  Acute Rehab PT Goals Patient Stated Goal: get stronger; return home PT Goal Formulation: With patient/family Time For Goal Achievement: 06/12/21 Potential to Achieve Goals: Good Additional Goals Additional Goal #1: Will increase LE strength to 5/5 for gait and stairs    Frequency Min 3X/week   Barriers to discharge        Co-evaluation               AM-PAC PT "6 Clicks" Mobility  Outcome Measure Help needed turning from your back to your side while in a flat bed without using bedrails?: None Help needed moving from lying on your back to sitting on the side of a flat bed without using bedrails?: A Little Help needed moving to and from a bed to a chair (including a wheelchair)?: A Little Help needed standing up  from a chair using your arms (e.g., wheelchair or bedside chair)?: A Little Help needed to walk in hospital room?: A Little Help needed climbing 3-5 steps with a railing? : A Little 6 Click Score: 19    End of Session Equipment Utilized During Treatment: Gait belt Activity Tolerance: Patient tolerated treatment well Patient left: in bed;with call bell/phone within reach;with family/visitor present (in ED on stretcher) Nurse Communication: Mobility status;Other (comment) (reports has not had lunch) PT Visit Diagnosis: Muscle weakness (generalized) (M62.81);Other abnormalities of gait and mobility (R26.89)    Time: 2595-6387 PT Time Calculation (min) (ACUTE ONLY): 27 min   Charges:   PT Evaluation $PT Eval Low Complexity: 1 Low PT Treatments $Gait Training: 8-22 mins        Anise Salvo, PT Acute Rehab Services Pager [phone] [facility] [phone]   Rayetta Humphrey 05/29/2021, 4:49 PM

## 2021-05-29 NOTE — ED Notes (Signed)
CSF fluid walked to lab; requisitions sent via tube

## 2021-05-29 NOTE — ED Notes (Signed)
Per MRI, pt will be transported directly to floor from MRI.

## 2021-05-29 NOTE — Progress Notes (Signed)
Patient arrived to room 4N04 from ED.  Assessment complete, VS obtained, and Admission database began.

## 2021-05-29 NOTE — Progress Notes (Signed)
PROGRESS NOTE                                                                                                                                                                                                             Patient Demographics:    Audrey [name], is a 40 y.o. female, DOB - 1981-02-22, ZOX:[id number]  Outpatient Primary MD for the patient is Julieanne Manson, MD    LOS - 0  Admit date - 05/28/2021    Chief Complaint  Patient presents with   Numbness       Brief Narrative (HPI from H&P) - Audrey [name] is a 40 y.o. 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  Seen by neurology, MRI brain and C-spine unremarkable, LP essentially unremarkable, CDI P suspected placed on IVIG x 5 days, continue supportive care with PT OT.  Monitor final CSF studies along withB1, B6, B12, MMA, folate, RPR, TSH, SPEP.   2.  Lupus.  Currently on Plaquenil has been off of methotrexate for 2 weeks prior to admission, defer this to primary rheumatologist.   3.  History of migraines.  Continue Topamax.  4.  GERD.  On PPI continue.  5.  Prolonged QT at 499 ms.  Replace electrolytes and  monitor. EKG 05/30/21   6.  Hypokalemia.  Replace.       Condition - Fair  Family Communication  :  None  Code Status :  Ful  Consults  :  Neuro  PUD Prophylaxis : PPI   Procedures  :     MRI Brain and C  Spine - non acute  LP - unremarkable CSF      Disposition Plan  :    Dispo: The patient is from: Home              Anticipated d/c is to: Home              Patient currently is not medically stable to d/c.   Difficult to place patient No  DVT Prophylaxis  :    enoxaparin (LOVENOX) injection 30 mg Start: 05/29/21 1000 SCDs Start: 05/29/21 0041    Lab Results  Component Value Date   PLT 151 05/29/2021    Diet :  Diet Order             Diet Heart Room service appropriate? Yes; Fluid consistency: Thin  Diet effective now                    Inpatient Medications  Scheduled Meds:  enoxaparin (LOVENOX) injection  30 mg Subcutaneous Q24H   hydroxychloroquine  400 mg Oral Daily   pantoprazole  40 mg Oral Daily   potassium chloride  40 mEq Oral BID   sodium chloride flush  3 mL Intravenous Q12H   [START ON 06/06/2021] thiamine injection  100 mg Intravenous Daily   topiramate  75 mg Oral QHS   Continuous Infusions:  dextrose Stopped (05/29/21 0646)   Immune Globulin 10% Stopped (05/29/21 0645)   magnesium sulfate bolus IVPB 1 g (05/29/21 0819)   thiamine injection Stopped (05/29/21 0443)   Followed by   Melene Muller ON 05/31/2021] thiamine injection     PRN Meds:.acetaminophen **OR** acetaminophen, polyethylene glycol  Antibiotics  :    Anti-infectives (From admission, onward)    Start     Dose/Rate Route Frequency Ordered Stop   05/29/21 0038  hydroxychloroquine (PLAQUENIL) tablet 400 mg       Note to Pharmacy: 2 tabs by mouth once daily     400 mg Oral Daily 05/29/21 0042          Time Spent in minutes  30   Susa Raring M.D on 05/29/2021 at 9:12 AM  To page go to [email or web address]   [facility] -  Office  [phone]    See all  Orders from today for further details    Objective:   Vitals:   05/29/21 0745 05/29/21 0800 05/29/21 0815 05/29/21 0859  BP: 120/71 138/75 111/61 (!) 110/59  Pulse: 65 85 75 78  Resp: 19 16 20 20   Temp:      TempSrc:      SpO2: 100% 100% 100% 100%    Wt Readings from Last 3 Encounters:  05/16/21 54.4 kg  05/15/21 53.5 kg  05/07/21 54.9 kg     Intake/Output Summary (Last 24 hours) at 05/29/2021 0912 Last data filed at 05/29/2021 0646 Gross per 24 hour  Intake 1850 ml  Output --  Net 1850 ml     Physical Exam  Awake Alert, No new F.N deficits but does have generalized weakness worse in L.leg, Normal affect [place].AT,PERRAL Supple Neck,No JVD, No cervical lymphadenopathy appriciated.  Symmetrical Chest wall movement, Good air movement bilaterally, CTAB RRR,No Gallops,Rubs or new Murmurs, No Parasternal Heave +ve B.Sounds, Abd Soft, No tenderness, No organomegaly appriciated, No rebound - guarding or rigidity. No Cyanosis, Clubbing or edema, No new Rash or bruise      Data Review:    CBC Recent Labs  Lab 05/28/21 1907 05/29/21 0520  WBC 5.7 3.2*  HGB 12.2 11.0*  HCT 36.1 31.2*  PLT 171 151  MCV 88.9 86.9  MCH 30.0 30.6  MCHC 33.8 35.3  RDW 13.2 13.4  LYMPHSABS 0.9  --   MONOABS 0.3  --   EOSABS 0.0  --   BASOSABS 0.0  --     Recent Labs  Lab 05/28/21 1907 05/28/21 1925 05/28/21 2016 05/29/21 0316  NA 138  --   --  139  K 3.8  --   --  3.1*  CL 112*  --   --  112*  CO2 18*  --   --  18*  GLUCOSE 93  --   --  86  BUN 1*  --   --  <5*  CREATININE 0.58  --   --  0.57  CALCIUM 7.8*  --   --  8.0*  AST 36  --   --   --   ALT 15  --   --   --   ALKPHOS 50  --   --   --   BILITOT 0.7  --   --   --   ALBUMIN 3.5  --   --   --   MG  --  1.8  --   --   CRP  --   --  <0.5  --   TSH  --   --   --  1.607    ------------------------------------------------------------------------------------------------------------------ No results for input(s): CHOL,  HDL, LDLCALC, TRIG, CHOLHDL, LDLDIRECT in the last 72 hours.  Lab Results  Component Value Date   HGBA1C 5.2 03/04/2021   ------------------------------------------------------------------------------------------------------------------ Recent Labs    05/29/21 0316  TSH 1.607    Cardiac Enzymes No results for input(s): CKMB, TROPONINI, MYOGLOBIN in the last 168 hours.  Invalid input(s): CK ------------------------------------------------------------------------------------------------------------------ No results found for: BNP  Micro Results Recent Results (from the past 240 hour(s))  Resp Panel by RT-PCR (Flu A&B, Covid) Nasopharyngeal Swab     Status: None   Collection Time: 05/29/21 12:04 AM   Specimen: Nasopharyngeal Swab; Nasopharyngeal(NP) swabs in vial transport medium  Result Value Ref Range Status   SARS Coronavirus 2 by RT PCR NEGATIVE NEGATIVE Final    Comment: (NOTE) SARS-CoV-2 target nucleic acids are NOT DETECTED.  The SARS-CoV-2 RNA is generally detectable in upper respiratory specimens during the acute phase of infection. The lowest concentration of SARS-CoV-2 viral copies this assay can detect is 138 copies/mL. A negative result does not preclude SARS-Cov-2 infection and should not be used as the sole basis for treatment or other patient management decisions. A negative result may occur with  improper specimen collection/handling, submission of specimen other than nasopharyngeal swab, presence of viral mutation(s) within the areas targeted by this assay, and inadequate number of viral copies(<138 copies/mL). A negative result must be combined with clinical observations, patient history, and epidemiological information. The expected result is Negative.  Fact Sheet for Patients:  BloggerCourse.com  Fact Sheet for Healthcare Providers:  SeriousBroker.it  This test is no t yet approved or cleared by the  [place] FDA and  has been authorized for detection and/or diagnosis of SARS-CoV-2 by FDA under an Emergency Use Authorization (EUA). This EUA will remain  in effect (meaning this test can be used) for the duration of the COVID-19 declaration under Section 564(b)(1) of the Act, 21 U.S.C.section 360bbb-3(b)(1), unless the authorization is terminated  or revoked sooner.       Influenza A by PCR NEGATIVE NEGATIVE Final   Influenza  B by PCR NEGATIVE NEGATIVE Final    Comment: (NOTE) The Xpert Xpress SARS-CoV-2/FLU/RSV plus assay is intended as an aid in the diagnosis of influenza from Nasopharyngeal swab specimens and should not be used as a sole basis for treatment. Nasal washings and aspirates are unacceptable for Xpert Xpress SARS-CoV-2/FLU/RSV testing.  Fact Sheet for Patients: BloggerCourse.comhttps://www.fda.gov/media/152166/download  Fact Sheet for Healthcare Providers: SeriousBroker.ithttps://www.fda.gov/media/152162/download  This test is not yet approved or cleared by the [place] FDA and has been authorized for detection and/or diagnosis of SARS-CoV-2 by FDA under an Emergency Use Authorization (EUA). This EUA will remain in effect (meaning this test can be used) for the duration of the COVID-19 declaration under Section 564(b)(1) of the Act, 21 U.S.C. section 360bbb-3(b)(1), unless the authorization is terminated or revoked.  Performed at [facility], [address] [place], [place] [address]   CSF culture w Stat Gram Stain     Status: None (Preliminary result)   Collection Time: 05/29/21  1:05 AM   Specimen: CSF; Cerebrospinal Fluid  Result Value Ref Range Status   Specimen Description CSF  Final   Special Requests NONE  Final   Gram Stain   Final    WBC PRESENT, PREDOMINANTLY MONONUCLEAR NO ORGANISMS SEEN CYTOSPIN SMEAR Performed at [facility], [address] [place], [place] [address]    Culture PENDING  Incomplete   Report Status PENDING  Incomplete     Radiology Reports MR Brain W and Wo Contrast  Result Date: 05/28/2021 CLINICAL DATA:  Left facial numbness EXAM: MRI HEAD WITHOUT AND WITH CONTRAST TECHNIQUE: Multiplanar, multiecho pulse sequences of the brain and surrounding structures were obtained without and with intravenous contrast. CONTRAST:  5mL GADAVIST GADOBUTROL 1 MMOL/ML IV SOLN COMPARISON:  None. FINDINGS: Brain: No acute infarct, mass effect or extra-axial collection. No acute or chronic hemorrhage. Normal white matter signal, parenchymal volume and CSF spaces. The midline structures are normal. Vascular: Major flow voids are preserved. Skull and upper cervical spine: Normal calvarium and skull base. Visualized upper cervical spine and soft tissues are normal. Sinuses/Orbits:No paranasal sinus fluid levels or advanced mucosal thickening. No mastoid or middle ear effusion. Normal orbits. IMPRESSION: Normal brain MRI. Electronically Signed   By: Deatra RobinsonKevin  Herman M.D.   On: 05/28/2021 23:06   MR Cervical Spine W or Wo Contrast  Result Date: 05/28/2021 CLINICAL DATA:  Left facial numbness and hand contracting. Migraine. EXAM: MRI CERVICAL SPINE WITHOUT AND WITH CONTRAST TECHNIQUE: Multiplanar and multiecho pulse sequences of the cervical spine, to include the craniocervical junction and cervicothoracic junction, were obtained without and with intravenous contrast. CONTRAST:  5mL GADAVIST GADOBUTROL 1 MMOL/ML IV SOLN COMPARISON:  None. FINDINGS: Alignment: Physiologic. Vertebrae: No fracture, evidence of discitis, or bone lesion. Cord: Normal signal and morphology. Posterior Fossa, vertebral arteries, paraspinal tissues: Negative. Disc levels: No spinal canal or neural foraminal stenosis. IMPRESSION: Normal MRI of the cervical spine. Electronically Signed   By: Deatra RobinsonKevin  Herman M.D.   On: 05/28/2021 23:13   DG Chest Port 1 View  Result Date: 05/28/2021 CLINICAL DATA:  Shortness of breath, left arm numbness and tingling. EXAM: PORTABLE CHEST 1 VIEW  COMPARISON:  03/19/2021 FINDINGS: The heart size and mediastinal contours are within normal limits. Both lungs are clear. The visualized skeletal structures are unremarkable. IMPRESSION: No active disease. Electronically Signed   By: Burman NievesWilliam  Stevens M.D.   On: 05/28/2021 19:45

## 2021-05-30 ENCOUNTER — Encounter (HOSPITAL_COMMUNITY): Payer: Self-pay | Admitting: Internal Medicine

## 2021-05-30 ENCOUNTER — Ambulatory Visit: Payer: Self-pay | Admitting: Internal Medicine

## 2021-05-30 DIAGNOSIS — R29818 Other symptoms and signs involving the nervous system: Secondary | ICD-10-CM

## 2021-05-30 DIAGNOSIS — R531 Weakness: Secondary | ICD-10-CM

## 2021-05-30 LAB — CBC WITH DIFFERENTIAL/PLATELET
Abs Immature Granulocytes: 0.01 10*3/uL (ref 0.00–0.07)
Basophils Absolute: 0 10*3/uL (ref 0.0–0.1)
Basophils Relative: 1 %
Eosinophils Absolute: 0 10*3/uL (ref 0.0–0.5)
Eosinophils Relative: 1 %
HCT: 33.4 % — ABNORMAL LOW (ref 36.0–46.0)
Hemoglobin: 11.6 g/dL — ABNORMAL LOW (ref 12.0–15.0)
Immature Granulocytes: 0 %
Lymphocytes Relative: 44 %
Lymphs Abs: 1.6 10*3/uL (ref 0.7–4.0)
MCH: 30.5 pg (ref 26.0–34.0)
MCHC: 34.7 g/dL (ref 30.0–36.0)
MCV: 87.9 fL (ref 80.0–100.0)
Monocytes Absolute: 0.3 10*3/uL (ref 0.1–1.0)
Monocytes Relative: 8 %
Neutro Abs: 1.7 10*3/uL (ref 1.7–7.7)
Neutrophils Relative %: 46 %
Platelets: 177 10*3/uL (ref 150–400)
RBC: 3.8 MIL/uL — ABNORMAL LOW (ref 3.87–5.11)
RDW: 13.9 % (ref 11.5–15.5)
WBC: 3.6 10*3/uL — ABNORMAL LOW (ref 4.0–10.5)
nRBC: 0 % (ref 0.0–0.2)

## 2021-05-30 LAB — COMPREHENSIVE METABOLIC PANEL
ALT: 16 U/L (ref 0–44)
AST: 20 U/L (ref 15–41)
Albumin: 3.2 g/dL — ABNORMAL LOW (ref 3.5–5.0)
Alkaline Phosphatase: 51 U/L (ref 38–126)
Anion gap: 4 — ABNORMAL LOW (ref 5–15)
BUN: <5 mg/dL — ABNORMAL LOW (ref 6–20)
CO2: 22 mmol/L (ref 22–32)
Calcium: 8.3 mg/dL — ABNORMAL LOW (ref 8.9–10.3)
Chloride: 111 mmol/L (ref 98–111)
Creatinine, Ser: 0.69 mg/dL (ref 0.44–1.00)
GFR, Estimated: >60 mL/min (ref >60)
Glucose, Bld: 81 mg/dL (ref 70–99)
Potassium: 4.4 mmol/L (ref 3.5–5.1)
Sodium: 137 mmol/L (ref 135–145)
Total Bilirubin: 0.6 mg/dL (ref 0.3–1.2)
Total Protein: 6.7 g/dL (ref 6.5–8.1)

## 2021-05-30 LAB — ANGIOTENSIN CONVERTING ENZYME, CSF: Angio Convert Enzyme: <1.5 U/L (ref 0.0–2.5)

## 2021-05-30 LAB — PROTEIN ELECTROPHORESIS, SERUM
A/G Ratio: 1.3 (ref 0.7–1.7)
Albumin ELP: 3.4 g/dL (ref 2.9–4.4)
Alpha-1-Globulin: 0.2 g/dL (ref 0.0–0.4)
Alpha-2-Globulin: 0.6 g/dL (ref 0.4–1.0)
Beta Globulin: 0.8 g/dL (ref 0.7–1.3)
Gamma Globulin: 1.1 g/dL (ref 0.4–1.8)
Globulin, Total: 2.6 g/dL (ref 2.2–3.9)
Total Protein ELP: 6 g/dL (ref 6.0–8.5)

## 2021-05-30 LAB — IGG CSF INDEX
Albumin CSF-mCnc: 11 mg/dL (ref 7–29)
Albumin: 4 g/dL (ref 3.8–4.8)
CSF IgG Index: 0.6 (ref 0.0–0.7)
IgG (Immunoglobin G), Serum: 1118 mg/dL (ref 586–1602)
IgG, CSF: 1.8 mg/dL (ref 0.0–6.7)
IgG/Alb Ratio, CSF: 0.16 (ref 0.00–0.25)

## 2021-05-30 LAB — MAGNESIUM: Magnesium: 2.1 mg/dL (ref 1.7–2.4)

## 2021-05-30 LAB — HSV 1/2 PCR, CSF
HSV-1 DNA: NEGATIVE
HSV-2 DNA: NEGATIVE

## 2021-05-30 LAB — VDRL, CSF: VDRL Quant, CSF: NONREACTIVE

## 2021-05-30 LAB — CK: Total CK: 32 U/L — ABNORMAL LOW (ref 38–234)

## 2021-05-30 MED ORDER — ONDANSETRON HCL 4 MG/2ML IJ SOLN: 4.0000 mg | Freq: Four times a day (QID) | INTRAMUSCULAR | Status: DC

## 2021-05-30 MED ORDER — LACTATED RINGERS IV SOLN: INTRAVENOUS | Status: DC

## 2021-05-30 MED ORDER — ONDANSETRON HCL 4 MG/2ML IJ SOLN
4.0000 mg | Freq: Four times a day (QID) | INTRAMUSCULAR | Status: DC | PRN
  Administered 2021-05-30: 4 mg via INTRAVENOUS
  Filled 2021-05-30 (×2): qty 2

## 2021-05-30 NOTE — Progress Notes (Signed)
PROGRESS NOTE                                                                                                                                                                                                             Patient Demographics:    Audrey [name], is a 40 y.o. female, DOB - [birth year], BDZ:[id number]  Outpatient Primary MD for the patient is Julieanne Manson, MD    LOS - 1  Admit date - 05/28/2021    Chief Complaint  Patient presents with   Numbness       Brief Narrative (HPI from H&P) - Audrey [name] is a 40 y.o. 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 No new focal weakness, but continues to have diffuse generalized .   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  Seen by neurology, MRI brain and C-spine unremarkable, LP essentially unremarkable, CDI P suspected placed on IVIG x 5 days, continue supportive care with PT OT.  Monitor final CSF studies along withB1, B6, B12, MMA, folate, RPR, TSH, SPEP.   2.  Lupus.  Currently on Plaquenil has been off of methotrexate for 2 weeks prior to admission, defer this to primary rheumatologist.   3.  History of migraines.  Continue Topamax.  4.  GERD.  On PPI continue.  5.  Prolonged QT at 499 ms.  Replace  electrolytes and monitor. EKG 05/30/21 pending.   6.  Hypokalemia.  Replaced.       Condition - Fair  Family Communication  :  None  Code Status :  Ful  Consults  :  Neuro  PUD Prophylaxis : PPI   Procedures  :  MRI Brain and C Spine - non acute  LP - unremarkable CSF      Disposition Plan  :    Dispo: The patient is from: Home              Anticipated d/c is to: Home              Patient currently is not medically stable to d/c.   Difficult to place patient No  DVT Prophylaxis  :    enoxaparin (LOVENOX) injection 40 mg Start: 05/29/21 1600 SCDs Start: 05/29/21 0041    Lab Results  Component Value Date   PLT 177 05/30/2021    Diet :  Diet Order             Diet Heart Room service appropriate? Yes; Fluid consistency: Thin  Diet effective now                    Inpatient Medications  Scheduled Meds:  enoxaparin (LOVENOX) injection  40 mg Subcutaneous Q24H   hydroxychloroquine  400 mg Oral Daily   ondansetron (ZOFRAN) IV  4 mg Intravenous Q6H   pantoprazole  40 mg Oral Daily   sodium chloride flush  3 mL Intravenous Q12H   [START ON 06/06/2021] thiamine injection  100 mg Intravenous Daily   topiramate  75 mg Oral QHS   Continuous Infusions:  dextrose Stopped (05/29/21 0646)   Immune Globulin 10% Stopped (05/29/21 0645)   thiamine injection 500 mg (05/30/21 0657)   Followed by   Melene Muller[START ON 05/31/2021] thiamine injection     PRN Meds:.acetaminophen **OR** acetaminophen, polyethylene glycol  Antibiotics  :    Anti-infectives (From admission, onward)    Start     Dose/Rate Route Frequency Ordered Stop   05/29/21 0038  hydroxychloroquine (PLAQUENIL) tablet 400 mg       Note to Pharmacy: 2 tabs by mouth once daily     400 mg Oral Daily 05/29/21 0042          Time Spent in minutes  30   Susa RaringPrashant Saiya Crist M.D on 05/30/2021 at 9:23 AM  To page go to [email or web address]   [facility] -  Office  [phone]    See all Orders from today for  further details    Objective:   Vitals:   05/29/21 2300 05/30/21 0315 05/30/21 0321 05/30/21 0737  BP: 105/70  100/70 (!) 108/54  Pulse: 63  63 67  Resp: 16  18 18   Temp: 97.6 F (36.4 C)  97.8 F (36.6 C) 97.6 F (36.4 C)  TempSrc: Oral  Oral Oral  SpO2: 100%  100% 100%  Weight:  54 kg    Height:  4' 11.5" (1.511 m)      Wt Readings from Last 3 Encounters:  05/30/21 54 kg  05/16/21 54.4 kg  05/15/21 53.5 kg    No intake or output data in the 24 hours ending 05/30/21 [id number]    Physical Exam  Awake Alert, No new F.N deficits but does have generalized weakness worse in L.leg, Normal affect [place].AT,PERRAL Supple Neck,No JVD, No cervical lymphadenopathy appriciated.  Symmetrical Chest wall movement, Good air movement bilaterally, CTAB RRR,No Gallops, Rubs or new Murmurs, No Parasternal Heave +ve B.Sounds, Abd Soft, No tenderness, No organomegaly appriciated, No rebound - guarding or rigidity. No Cyanosis, Clubbing or edema, No new Rash or bruise      Data Review:    CBC Recent Labs  Lab 05/28/21 1907 05/29/21 0520 05/30/21 0258  WBC 5.7 3.2* 3.6*  HGB 12.2 11.0* 11.6*  HCT 36.1 31.2* 33.4*  PLT 171 151 177  MCV 88.9 86.9 87.9  MCH 30.0 30.6 30.5  MCHC 33.8 35.3 34.7  RDW 13.2 13.4 13.9  LYMPHSABS 0.9  --  1.6  MONOABS 0.3  --  0.3  EOSABS 0.0  --  0.0  BASOSABS 0.0  --  0.0    Recent Labs  Lab 05/28/21 1907 05/28/21 1925 05/28/21 2016 05/29/21 0316 05/30/21 0258  NA 138  --   --  139 137  K 3.8  --   --  3.1* 4.4  CL 112*  --   --  112* 111  CO2 18*  --   --  18* 22  GLUCOSE 93  --   --  86 81  BUN 1*  --   --  <5* <5*  CREATININE 0.58  --   --  0.57 0.69  CALCIUM 7.8*  --   --  8.0* 8.3*  AST 36  --   --   --  20  ALT 15  --   --   --  16  ALKPHOS 50  --   --   --  51  BILITOT 0.7  --   --   --  0.6  ALBUMIN 3.5  --   --   --  3.2*  MG  --  1.8  --   --  2.1  CRP  --   --  <0.5  --   --   TSH  --   --   --  1.607  --      ------------------------------------------------------------------------------------------------------------------ No results for input(s): CHOL, HDL, LDLCALC, TRIG, CHOLHDL, LDLDIRECT in the last 72 hours.  Lab Results  Component Value Date   HGBA1C 5.2 03/04/2021   ------------------------------------------------------------------------------------------------------------------ Recent Labs    05/29/21 0316  TSH 1.607    Cardiac Enzymes No results for input(s): CKMB, TROPONINI, MYOGLOBIN in the last 168 hours.  Invalid input(s): CK ------------------------------------------------------------------------------------------------------------------ No results found for: BNP  Micro Results Recent Results (from the past 240 hour(s))  Resp Panel by RT-PCR (Flu A&B, Covid) Nasopharyngeal Swab     Status: None   Collection Time: 05/29/21 12:04 AM   Specimen: Nasopharyngeal Swab; Nasopharyngeal(NP) swabs in vial transport medium  Result Value Ref Range Status   SARS Coronavirus 2 by RT PCR NEGATIVE NEGATIVE Final    Comment: (NOTE) SARS-CoV-2 target nucleic acids are NOT DETECTED.  The SARS-CoV-2 RNA is generally detectable in upper respiratory specimens during the acute phase of infection. The lowest concentration of SARS-CoV-2 viral copies this assay can detect is 138 copies/mL. A negative result does not preclude SARS-Cov-2 infection and should not be used as the sole basis for treatment or other patient management decisions. A negative result may occur with  improper specimen collection/handling, submission of specimen other than nasopharyngeal swab, presence of viral mutation(s) within the areas targeted by this assay, and inadequate number of viral copies(<138 copies/mL). A negative result must be combined with clinical observations, patient history, and epidemiological information. The expected result is Negative.  Fact Sheet for Patients:   BloggerCourse.com  Fact Sheet for Healthcare Providers:  SeriousBroker.it  This test is no t yet approved or cleared by the [place] FDA and  has been authorized for detection and/or diagnosis of SARS-CoV-2 by FDA under an Emergency Use Authorization (EUA). This EUA will remain  in effect (meaning this test can be used) for the duration  of the COVID-19 declaration under Section 564(b)(1) of the Act, 21 U.S.C.section 360bbb-3(b)(1), unless the authorization is terminated  or revoked sooner.       Influenza A by PCR NEGATIVE NEGATIVE Final   Influenza B by PCR NEGATIVE NEGATIVE Final    Comment: (NOTE) The Xpert Xpress SARS-CoV-2/FLU/RSV plus assay is intended as an aid in the diagnosis of influenza from Nasopharyngeal swab specimens and should not be used as a sole basis for treatment. Nasal washings and aspirates are unacceptable for Xpert Xpress SARS-CoV-2/FLU/RSV testing.  Fact Sheet for Patients: BloggerCourse.com  Fact Sheet for Healthcare Providers: SeriousBroker.it  This test is not yet approved or cleared by the [place] FDA and has been authorized for detection and/or diagnosis of SARS-CoV-2 by FDA under an Emergency Use Authorization (EUA). This EUA will remain in effect (meaning this test can be used) for the duration of the COVID-19 declaration under Section 564(b)(1) of the Act, 21 U.S.C. section 360bbb-3(b)(1), unless the authorization is terminated or revoked.  Performed at [facility], [address], [place], [place] [address]   CSF culture w Stat Gram Stain     Status: None (Preliminary result)   Collection Time: 05/29/21  1:05 AM   Specimen: CSF; Cerebrospinal Fluid  Result Value Ref Range Status   Specimen Description CSF  Final   Special Requests NONE  Final   Gram Stain   Final    WBC PRESENT, PREDOMINANTLY MONONUCLEAR NO ORGANISMS  SEEN CYTOSPIN SMEAR Performed at [facility], [address] [place], [place] [address]    Culture PENDING  Incomplete   Report Status PENDING  Incomplete    Radiology Reports MR Brain W and Wo Contrast  Result Date: 05/28/2021 CLINICAL DATA:  Left facial numbness EXAM: MRI HEAD WITHOUT AND WITH CONTRAST TECHNIQUE: Multiplanar, multiecho pulse sequences of the brain and surrounding structures were obtained without and with intravenous contrast. CONTRAST:  74mL GADAVIST GADOBUTROL 1 MMOL/ML IV SOLN COMPARISON:  None. FINDINGS: Brain: No acute infarct, mass effect or extra-axial collection. No acute or chronic hemorrhage. Normal white matter signal, parenchymal volume and CSF spaces. The midline structures are normal. Vascular: Major flow voids are preserved. Skull and upper cervical spine: Normal calvarium and skull base. Visualized upper cervical spine and soft tissues are normal. Sinuses/Orbits:No paranasal sinus fluid levels or advanced mucosal thickening. No mastoid or middle ear effusion. Normal orbits. IMPRESSION: Normal brain MRI. Electronically Signed   By: Deatra Robinson M.D.   On: 05/28/2021 23:06   MR Cervical Spine W or Wo Contrast  Result Date: 05/28/2021 CLINICAL DATA:  Left facial numbness and hand contracting. Migraine. EXAM: MRI CERVICAL SPINE WITHOUT AND WITH CONTRAST TECHNIQUE: Multiplanar and multiecho pulse sequences of the cervical spine, to include the craniocervical junction and cervicothoracic junction, were obtained without and with intravenous contrast. CONTRAST:  33mL GADAVIST GADOBUTROL 1 MMOL/ML IV SOLN COMPARISON:  None. FINDINGS: Alignment: Physiologic. Vertebrae: No fracture, evidence of discitis, or bone lesion. Cord: Normal signal and morphology. Posterior Fossa, vertebral arteries, paraspinal tissues: Negative. Disc levels: No spinal canal or neural foraminal stenosis. IMPRESSION: Normal MRI of the cervical spine. Electronically Signed   By: Deatra Robinson M.D.    On: 05/28/2021 23:13   MR FEMUR LEFT WO CONTRAST  Result Date: 05/30/2021 CLINICAL DATA:  Left greater than right leg weakness and numbness. 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 EXAM: MR OF THE LEFT FEMUR WITHOUT CONTRAST TECHNIQUE: Multiplanar, multisequence MR imaging of the left femur  was performed. No intravenous contrast was administered. COMPARISON:  None. FINDINGS: Bones/Joint/Cartilage No marrow signal abnormality. No fracture or dislocation. Joint spaces are preserved. Trace bilateral knee joint effusions. Ligaments The bilateral hip labra are grossly intact. The bilateral knee ligaments and menisci are grossly intact. Muscles and Tendons Intact.  No muscle edema or atrophy. Soft tissue No fluid collection or hematoma. No soft tissue mass. The visualized pelvic structures are unremarkable. IMPRESSION: 1. Normal MRI of the left thigh. No evidence of myositis. Electronically Signed   By: Obie Dredge M.D.   On: 05/30/2021 08:01   DG Chest Port 1 View  Result Date: 05/28/2021 CLINICAL DATA:  Shortness of breath, left arm numbness and tingling. EXAM: PORTABLE CHEST 1 VIEW COMPARISON:  03/19/2021 FINDINGS: The heart size and mediastinal contours are within normal limits. Both lungs are clear. The visualized skeletal structures are unremarkable. IMPRESSION: No active disease. Electronically Signed   By: Burman Nieves M.D.   On: 05/28/2021 19:45

## 2021-05-30 NOTE — Progress Notes (Signed)
Occupational Therapy Evaluation Patient Details Name: Audrey [name] MRN: [id number] DOB: 1981-05-11 Today's Date: 05/30/2021    History of Present Illness Pt is 40 yo female admitted on 05/28/21 with Bil hand weakness and contractures, LE weakness and numbness, and headache.  MRI of brain and C-spine unremarkable.  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 History includes lupus, GERD, arthritis, migraines, and anemia.   Clinical Impression   Interpreter Darrelyn Hillock) used throughout session. Pt reports no history of falls at home, however pt reports increased difficulty with IADL tasks due to fatigue, weakness and dizziness. Pt with generalized BUE/LE weakness, however L side appears weaker than R. Reports sensation changes L side as well. Pt states that at times vision is blurred/double at times. Will follow acutely to establish BUE HEP in addition to educating pt on energy conservation and strategies to reduce risk of falls.     Follow Up Recommendations  Outpatient OT    Equipment Recommendations  Tub/shower seat    Recommendations for Other Services       Precautions / Restrictions Precautions Precautions: Fall      Mobility Bed Mobility Overal bed mobility: Modified Independent                  Transfers Overall transfer level: Needs assistance   Transfers: Sit to/from Stand Sit to Stand: Min guard              Balance Overall balance assessment: Needs assistance Sitting-balance support: No upper extremity supported Sitting balance-Leahy Scale: Good       Standing balance-Leahy Scale: Fair Standing balance comment: ? L knee buckle during ADL                           ADL either performed or assessed with clinical judgement   ADL Overall ADL's : Needs assistance/impaired     Grooming: Set up;Standing;Supervision/safety   Upper Body Bathing: Supervision/ safety;Sitting   Lower Body  Bathing: Min guard;Sit to/from stand   Upper Body Dressing : Set up;Sitting   Lower Body Dressing: Min guard;Sit to/from stand   Toilet Transfer: Min guard;Ambulation   Toileting- Clothing Manipulation and Hygiene: Supervision/safety       Functional mobility during ADLs: Min guard;Cueing for safety General ADL Comments: fatigues easily     Vision Baseline Vision/History: Wears glasses Wears Glasses: At all times Patient Visual Report: Blurring of vision ("double vision at times") Vision Assessment?: Vision impaired- to be further tested in functional context     Perception     Praxis      Pertinent Vitals/Pain Pain Assessment: 0-10 Pain Score: 3  Pain Location: headache Pain Descriptors / Indicators: Headache Pain Intervention(s): Limited activity within patient's tolerance     Hand Dominance Right   Extremity/Trunk Assessment Upper Extremity Assessment Upper Extremity Assessment: RUE deficits/detail;LUE deficits/detail RUE Deficits / Details: AROM WFL; overall 3+/ shoulder' 4/5 elbow flexion; 3+/5 extension; hand overall 4/5 RUE Coordination: decreased fine motor (but functional) LUE Deficits / Details: similar to R; but generally weaker, especially distally; but using funcitonally LUE Sensation: decreased light touch (impaired) LUE Coordination: decreased fine motor;decreased gross motor   Lower Extremity Assessment Lower Extremity Assessment: Defer to PT evaluation   Cervical / Trunk Assessment Cervical / Trunk Assessment: Normal   Communication Communication Communication: Prefers language other than English   Cognition Arousal/Alertness: Awake/alert Behavior During Therapy: WFL for tasks assessed/performed Overall Cognitive Status:  Difficult to assess                                 General Comments: slower processing at times   General Comments  VSS; 121/62; SpO2 98; 75; 119/59 after activity    Exercises     Shoulder Instructions       Home Living Family/patient expects to be discharged to:: Private residence Living Arrangements: Spouse/significant other Available Help at Discharge: Family;Available 24 hours/day Type of Home: House Home Access: Level entry     Home Layout: Two level;Able to live on main level with bedroom/bathroom     Bathroom Shower/Tub: Producer, television/film/video: Standard Bathroom Accessibility: Yes How Accessible: Accessible via walker Home Equipment: None          Prior Functioning/Environment Level of Independence: Independent        Comments: Prior to the last  2 months pt was completely independent - has had difficulty walking last 3 weeks (easily fatigues); has 3 children (10yo-19) who can assist over the summer while husband works; enjoys cooking        OT Problem List: Decreased strength;Decreased activity tolerance;Impaired balance (sitting and/or standing);Impaired vision/perception;Decreased coordination;Decreased safety awareness;Decreased knowledge of use of DME or AE;Impaired sensation;Impaired UE functional use;Pain      OT Treatment/Interventions: Self-care/ADL training;Therapeutic exercise;Neuromuscular education;Energy conservation;DME and/or AE instruction;Therapeutic activities;Patient/family education;Balance training    OT Goals(Current goals can be found in the care plan section) Acute Rehab OT Goals Patient Stated Goal: get stronger; return home OT Goal Formulation: With patient Time For Goal Achievement: 06/13/21 Potential to Achieve Goals: Good  OT Frequency: Min 2X/week   Barriers to D/C:            Co-evaluation              AM-PAC OT "6 Clicks" Daily Activity     Outcome Measure Help from another person eating meals?: A Little Help from another person taking care of personal grooming?: A Little Help from another person toileting, which includes using toliet, bedpan, or urinal?: A Little Help from another person bathing  (including washing, rinsing, drying)?: A Little Help from another person to put on and taking off regular upper body clothing?: A Little Help from another person to put on and taking off regular lower body clothing?: A Little 6 Click Score: 18   End of Session Equipment Utilized During Treatment: Gait belt Nurse Communication: Mobility status  Activity Tolerance: Patient tolerated treatment well Patient left: in chair;with call bell/phone within reach;with chair alarm set  OT Visit Diagnosis: Unsteadiness on feet (R26.81);Muscle weakness (generalized) (M62.81);Other abnormalities of gait and mobility (R26.89);Pain;Dizziness and giddiness (R42);Low vision, both eyes (H54.2) Pain - part of body:  (headache)                Time: 0737-1062 OT Time Calculation (min): 47 min Charges:  OT General Charges $OT Visit: 1 Visit OT Evaluation $OT Eval Moderate Complexity: 1 Mod OT Treatments $Self Care/Home Management : 23-37 mins  Luisa Dago, OT/L   Acute OT Clinical Specialist Acute Rehabilitation Services Pager [phone] Office [phone]   [facility] 05/30/2021, 3:05 PM

## 2021-05-30 NOTE — Progress Notes (Signed)
Neurology Progress Note  Brief HPI: 40 y.o. 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  Subjective: No acute overnight events  Patient endorses left temporal headache that is throbbing since noon yesterday with a 6/10 severity and associated intermittent blurry vision, dizziness, and nausea.  Exam: Vitals:   05/30/21 0321 05/30/21 0737  BP: 100/70 (!) 108/54  Pulse: 63 67  Resp: 18 18  Temp: 97.8 F (36.6 C) 97.6 F (36.4 C)  SpO2: 100% 100%   Gen: Sitting up in bedside recliner, in no acute distress. Husband at bedside Resp: non-labored breathing, no respiratory distress on room air Abd: soft, non-tender, non-distended  Neuro: Mental Status: Awake, alert, and oriented to person, place, time, and situation. Good attention noted. She is able to provide a clear and coherent history of present illness via telephone interpreter assistance. Speech is intact without dysarthria or aphasia.  Naming, fluency, and comprehension are intact. No neglect is noted. Cranial Nerves:Pupils are equal, round, 5 mm and briskly reactive to light bilaterally, EOMI without ptosis or gaze preference, facial sensation is decreased to light touch and cool temperature on the left face, face is symmetric resting and with movement, hearing is intact to voice, palate elevates symmetrically, shoulders shrug symmetrically, tongue protrudes midline Motor: Generalized weakness noted throughout. Bilateral upper extremities with 4-/5 strength present biceps, triceps, and deltoids. Bilateral lower extremities with 4-/5 strength present  with knee flexion and extension, 4+/5 at the hip with distal worse than proximal weakness.  3/5 strength present in bilateral ankles with dorsi/plantar flexion.  Sensory: Sensation to light touch intact and symmetric on bilateral upper extremities. Decreased sensation to light touch and cool temperature also reported on the left lower extremity that is length dependent.  DTR: Absent bilateral patellae, achilles, and biceps. Plantars: Mute bilaterally Gait: Deferred  Pertinent Labs: CBC    Component Value Date/Time   WBC 3.6 (L) 05/30/2021 0258   RBC 3.80 (L) 05/30/2021 0258   HGB 11.6 (L) 05/30/2021 0258   HGB 13.3 05/16/2021 1601   HCT 33.4 (L) 05/30/2021 0258   HCT 39.3 05/16/2021 1601   PLT 177 05/30/2021 0258   PLT 198 05/16/2021 1601   MCV 87.9 05/30/2021 0258   MCV 87 05/16/2021 1601   MCH 30.5 05/30/2021 0258   MCHC 34.7 05/30/2021 0258   RDW 13.9 05/30/2021 0258   RDW 14.1 05/16/2021 1601   LYMPHSABS 1.6 05/30/2021 0258   LYMPHSABS 1.2 05/16/2021 1601   MONOABS 0.3 05/30/2021 0258   EOSABS 0.0 05/30/2021 0258   EOSABS 0.0 05/16/2021 1601   BASOSABS 0.0 05/30/2021 0258   BASOSABS 0.0 05/16/2021 1601   CMP     Component Value Date/Time   NA 137 05/30/2021 0258   NA 133 (L) 05/15/2021 1253   K 4.4 05/30/2021 0258   CL 111 05/30/2021 0258   CO2 22 05/30/2021 0258   GLUCOSE 81 05/30/2021 0258   BUN <5 (L) 05/30/2021 0258   BUN 3 (L) 05/15/2021 1253   CREATININE 0.69 05/30/2021 0258   CALCIUM 8.3 (L) 05/30/2021 0258   PROT 6.7 05/30/2021 0258   PROT 7.3 05/15/2021 1253   ALBUMIN  3.2 (L) 05/30/2021 0258   ALBUMIN 4.8 05/15/2021 1253   AST 20 05/30/2021 0258   ALT 16 05/30/2021 0258   ALKPHOS 51 05/30/2021 0258   BILITOT 0.6 05/30/2021 0258   BILITOT 0.3 05/15/2021 1253   GFRNONAA >60 05/30/2021 0258   GFRAA 134 02/16/2020 1000   Lab Results  Component Value Date   VITAMINB12 1,145 (H) 05/29/2021   Lab Results  Component Value Date   TSH 1.607  05/29/2021   Folate: 35.7 Gram Stain WBC PRESENT, PREDOMINANTLY MONONUCLEAR  NO ORGANISMS SEEN  CYTOSPIN SMEAR  Results for YURITZA, PAULHUS (MRN [id number]) as of 05/29/2021 08:36   Ref. Range 05/29/2021 01:05 05/29/2021 01:05  Appearance, CSF Latest Ref Range: CLEAR CLEAR CLEAR  Glucose, CSF Latest Ref Range: 40 - 70 mg/dL 53    RBC Count, CSF Latest Ref Range: 0 /cu mm 0 0  WBC, CSF Latest Ref Range: 0 - 5 /cu mm 0 0  Segmented Neutrophils-CSF Latest Ref Range: 0 - 6 % 0 0  Lymphs, CSF Latest Ref Range: 40 - 80 % 0 (L) 0 (L)  Monocyte-Macrophage-Spinal Fluid Latest Ref Range: 15 - 45 % 0 (L) 0 (L)  Eosinophils, CSF Latest Ref Range: 0 - 1 % 0 0  Other Cells, CSF Unknown TOO FEW TO COUNT, SMEAR AVAILABLE FOR REVIEW TOO FEW TO COUNT, SMEAR AVAILABLE FOR REVIEW  Color, CSF Latest Ref Range: COLORLESS COLORLESS COLORLESS  Supernatant Unknown NOT INDICATED NOT INDICATED  Total  Protein, CSF Latest Ref Range: 15 - 45 mg/dL 19    Tube # Unknown 1 4   CSF culture: No growth at 1 day RPR: Non Reactive HIV: Non Reactive  Lab Results  Component Value Date   CKTOTAL 32 (L) 05/30/2021   Imaging Reviewed: MR C spine with and without contrast 05/28/2021: No acute abnormalities.   MRI Brain with and without contrast 05/28/2021: No acute abnormalities  MRI left femur without contrast 05/29/2021: 1. Normal MRI of the left thigh. No evidence of myositis.  Assessment: 40 y.o. female who presented 05/28/2021 with gradually progressive BL hand weakness and numbness, weakness and numbness in BLE left worse than right over 5-6 weeks with examination revealing distal worse than proximal weakness in all extremities with sensory deficits to light touch and temperature and areflexia. Left hemisensory loss was reported to start in April with more recent right sided involvement over the past 5-6 weeks.  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 Her neuropathy is felt to be related to her systemic rheumatological illness versus nutritional component with poor PO intake 2/2 nausea versus polyradiculitis. Sjogren's is less likely despite SSA+ due to weakness in addition to sensory disturbances. - With ongoing concern for CIDP, patient was started on IVIG x 5 doses. First dose 05/29/2021 to be completed 06/02/2021.  - CSF without elevated protein to entirely support neuropathic process. Ddx includes a myopathic process, though less likely due to areflexia. Total CK obtained without elevation and MRI left femur obtained without evidence supporting myositis. With her history of autoimmune disease, I think that an autoimmune etiology is by far the most likely, other work-up as above.  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

## 2021-05-31 ENCOUNTER — Inpatient Hospital Stay (HOSPITAL_COMMUNITY): Payer: Self-pay

## 2021-05-31 DIAGNOSIS — I5031 Acute diastolic (congestive) heart failure: Secondary | ICD-10-CM

## 2021-05-31 LAB — ECHOCARDIOGRAM COMPLETE
Area-P 1/2: 3.65 cm2
Height: 59.5 in
S' Lateral: 2.5 cm
Weight: 1904.77 oz

## 2021-05-31 LAB — COMPREHENSIVE METABOLIC PANEL
ALT: 16 U/L (ref 0–44)
AST: 17 U/L (ref 15–41)
Albumin: 2.8 g/dL — ABNORMAL LOW (ref 3.5–5.0)
Alkaline Phosphatase: 46 U/L (ref 38–126)
Anion gap: 3 — ABNORMAL LOW (ref 5–15)
BUN: <5 mg/dL — ABNORMAL LOW (ref 6–20)
CO2: 22 mmol/L (ref 22–32)
Calcium: 8.1 mg/dL — ABNORMAL LOW (ref 8.9–10.3)
Chloride: 111 mmol/L (ref 98–111)
Creatinine, Ser: 0.65 mg/dL (ref 0.44–1.00)
GFR, Estimated: >60 mL/min (ref >60)
Glucose, Bld: 85 mg/dL (ref 70–99)
Potassium: 3.8 mmol/L (ref 3.5–5.1)
Sodium: 136 mmol/L (ref 135–145)
Total Bilirubin: 0.6 mg/dL (ref 0.3–1.2)
Total Protein: 6.2 g/dL — ABNORMAL LOW (ref 6.5–8.1)

## 2021-05-31 LAB — CBC WITH DIFFERENTIAL/PLATELET
Abs Immature Granulocytes: 0.01 10*3/uL (ref 0.00–0.07)
Basophils Absolute: 0 10*3/uL (ref 0.0–0.1)
Basophils Relative: 0 %
Eosinophils Absolute: 0 10*3/uL (ref 0.0–0.5)
Eosinophils Relative: 1 %
HCT: 30.6 % — ABNORMAL LOW (ref 36.0–46.0)
Hemoglobin: 10.4 g/dL — ABNORMAL LOW (ref 12.0–15.0)
Immature Granulocytes: 0 %
Lymphocytes Relative: 47 %
Lymphs Abs: 1.4 10*3/uL (ref 0.7–4.0)
MCH: 29.9 pg (ref 26.0–34.0)
MCHC: 34 g/dL (ref 30.0–36.0)
MCV: 87.9 fL (ref 80.0–100.0)
Monocytes Absolute: 0.2 10*3/uL (ref 0.1–1.0)
Monocytes Relative: 7 %
Neutro Abs: 1.4 10*3/uL — ABNORMAL LOW (ref 1.7–7.7)
Neutrophils Relative %: 45 %
Platelets: 164 10*3/uL (ref 150–400)
RBC: 3.48 MIL/uL — ABNORMAL LOW (ref 3.87–5.11)
RDW: 13.5 % (ref 11.5–15.5)
WBC: 3.1 10*3/uL — ABNORMAL LOW (ref 4.0–10.5)
nRBC: 0 % (ref 0.0–0.2)

## 2021-05-31 LAB — OLIGOCLONAL BANDS, CSF + SERM

## 2021-05-31 LAB — LYME DISEASE DNA BY PCR(BORRELIA BURG): Lyme Disease(B.burgdorferi)PCR: NEGATIVE

## 2021-05-31 LAB — MAGNESIUM: Magnesium: 1.7 mg/dL (ref 1.7–2.4)

## 2021-05-31 LAB — METHYLMALONIC ACID, SERUM: Methylmalonic Acid, Quantitative: 55 nmol/L (ref 0–378)

## 2021-05-31 LAB — VITAMIN B6: Vitamin B6: 4.1 ug/L (ref 3.4–65.2)

## 2021-05-31 MED ORDER — LACTATED RINGERS IV BOLUS
500.0000 mL | Freq: Once | INTRAVENOUS | Status: AC
  Administered 2021-05-31: 500 mL via INTRAVENOUS

## 2021-05-31 MED ORDER — ALUM & MAG HYDROXIDE-SIMETH 200-200-20 MG/5ML PO SUSP
30.0000 mL | Freq: Two times a day (BID) | ORAL | Status: AC
  Administered 2021-05-31 (×2): 30 mL via ORAL
  Filled 2021-05-31 (×2): qty 30

## 2021-05-31 MED ORDER — IMMUNE GLOBULIN (HUMAN) 10 GM/100ML IV SOLN
400.0000 mg/kg | INTRAVENOUS | Status: DC
  Administered 2021-05-31: 20 g via INTRAVENOUS
  Filled 2021-05-31 (×2): qty 200

## 2021-05-31 NOTE — Progress Notes (Signed)
Physical Therapy Treatment Patient Details Name: Audrey [name] MRN: [id number] DOB: 04/22/1981 Today's Date: 05/31/2021    History of Present Illness Pt is 40 yo female admitted on 05/28/21 with Bil hand weakness and contractures, LE weakness and numbness, and headache.  MRI of brain and C-spine unremarkable.  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 History includes lupus, GERD, arthritis, migraines, and anemia.    PT Comments    The pt was received in bed, agreeable to session with focus on OOB mobility and strengthening at this time. The pt was able to demo bed mobility and initial sit-stand without UE support or assist, no overt LOB, but pt does report feeling some weakness. The pt was able to complete 150 ft of hallway ambulation without need for physical assist or LOB. The pt was challenged by repeated bouts of increased speed, reports this makes her tired but no LOB, change in LE strength, stride length, or need for assist. The pt reports no dizziness or nausea with any mobility, changes in position, or activity at this time. Will continue to benefit from skilled PT to improve functional endurance and establish HEP for continued strengthening and mobility outside of PT sessions.    Follow Up Recommendations  Outpatient PT;Supervision - Intermittent     Equipment Recommendations  None recommended by PT    Recommendations for Other Services       Precautions / Restrictions Precautions Precautions: Fall Restrictions Weight Bearing Restrictions: No    Mobility  Bed Mobility Overal bed mobility: Modified Independent Bed Mobility: Supine to Sit     Supine to sit: Supervision     General bed mobility comments: pt completed without assist, HOB elevated but no assist needed.    Transfers Overall transfer level: Needs assistance Equipment used: None Transfers: Sit to/from Stand Sit to Stand: Min guard          General transfer comment: min guard for safety  Ambulation/Gait Ambulation/Gait assistance: Min guard Gait Distance (Feet): 150 Feet Assistive device: None Gait Pattern/deviations: Step-through pattern;Decreased stride length Gait velocity: decreased Gait velocity interpretation: 1.31 - 2.62 ft/sec, indicative of limited community ambulator General Gait Details: pt with step-through pattern but decreased stride length. able to complete x6 bouts of 10-15 ft speed intervals with gait. HR steady in 70-90s with activity.      Balance Overall balance assessment: Needs assistance Sitting-balance support: No upper extremity supported Sitting balance-Leahy Scale: Good     Standing balance support: No upper extremity supported Standing balance-Leahy Scale: Fair Standing balance comment: no UE support, no overt LOB with mobility                            Cognition Arousal/Alertness: Awake/alert Behavior During Therapy: WFL for tasks assessed/performed Overall Cognitive Status: Within Functional Limits for tasks assessed                                 General Comments: pt following all commands, able to answer all questions appropriately      Exercises General Exercises - Lower Extremity Long Arc Quad: AROM;Both;10 reps (against mod resistance, hold for 3-5 seconds) Heel Slides: AROM;Both;10 reps;Seated (against mod resistance, hold for 3-5 seconds)    General Comments General comments (skin integrity, edema, etc.): HR remained 70-90s with activity, pt with no c/o nausea, dizziness, or pain with  activity      Pertinent Vitals/Pain Pain Assessment: No/denies pain Pain Intervention(s): Monitored during session     PT Goals (current goals can now be found in the care plan section) Acute Rehab PT Goals Patient Stated Goal: get stronger; return home PT Goal Formulation: With patient/family Time For Goal Achievement: 06/12/21 Potential to Achieve Goals:  Good Progress towards PT goals: Progressing toward goals    Frequency    Min 3X/week      PT Plan Current plan remains appropriate       AM-PAC PT "6 Clicks" Mobility   Outcome Measure  Help needed turning from your back to your side while in a flat bed without using bedrails?: None Help needed moving from lying on your back to sitting on the side of a flat bed without using bedrails?: A Little Help needed moving to and from a bed to a chair (including a wheelchair)?: A Little Help needed standing up from a chair using your arms (e.g., wheelchair or bedside chair)?: A Little Help needed to walk in hospital room?: A Little Help needed climbing 3-5 steps with a railing? : A Little 6 Click Score: 19    End of Session Equipment Utilized During Treatment: Gait belt Activity Tolerance: Patient tolerated treatment well Patient left: with call bell/phone within reach;in chair;with nursing/sitter in room (to take pt to echo) Nurse Communication: Mobility status PT Visit Diagnosis: Muscle weakness (generalized) (M62.81);Other abnormalities of gait and mobility (R26.89)     Time: 4709-6283 PT Time Calculation (min) (ACUTE ONLY): 25 min  Charges:  $Gait Training: 23-37 mins                     Rolm Baptise, PT, DPT   Acute Rehabilitation Department Pager #: [phone]   Gaetana Michaelis 05/31/2021, 4:15 PM

## 2021-05-31 NOTE — Progress Notes (Signed)
  Echocardiogram 2D Echocardiogram has been performed.  Delcie Roch 05/31/2021, 3:20 PM

## 2021-05-31 NOTE — Progress Notes (Addendum)
PROGRESS NOTE                                                                                                                                                                                                             Patient Demographics:    Audrey [name], is a 40 y.o. female, DOB - 06/02/1981, ZOX:[id number]  Outpatient Primary MD for the patient is Julieanne MansonMulberry, Elizabeth, MD    LOS - 2  Admit date - 05/28/2021    Chief Complaint  Patient presents with   Numbness       Brief Narrative (HPI from H&P) - Audrey [name] is a 40 y.o. 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 No new focal weakness.  Generalized weakness somewhat better, some burning pressure behind her sternum.   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  Monitor final CSF studies along with HSV, B1, B6,  MMA, Lyme, SPEP.   2.  Lupus.  Currently on Plaquenil has been off of methotrexate for 2 weeks prior to admission, defer this to primary rheumatologist.   3.  History of migraines.  Continue Topamax.  4.  GERD.  On PPI continue.  5.  Prolonged  QT at 499 ms.  Resolved after replacing potassium.   6.  Hypokalemia.  Replaced.  7.  Atypical chest pressure on 05/31/2021.  Stable EKG, not tachycardic, no shortness of breath, trial of Maalox 2 doses, check TEE as well.       Condition - Fair  Family Communication  :  None  Code Status :  Ful  Consults  :  Neuro  PUD Prophylaxis : PPI   Procedures  :     MRI Brain and C Spine - non acute  LP - unremarkable CSF      Disposition Plan  :    Dispo: The patient is from: Home              Anticipated d/c is to: Home              Patient currently is not medically stable to d/c.   Difficult to place patient No  DVT Prophylaxis  :    enoxaparin (LOVENOX) injection 40 mg Start: 05/29/21 1600 SCDs Start: 05/29/21 0041    Lab Results  Component Value Date   PLT 164 05/31/2021    Diet :  Diet Order             Diet Heart Room service appropriate? Yes; Fluid consistency: Thin  Diet effective now                    Inpatient Medications  Scheduled Meds:  alum & mag hydroxide-simeth  30 mL Oral BID   enoxaparin (LOVENOX) injection  40 mg Subcutaneous Q24H   hydroxychloroquine  400 mg Oral Daily   pantoprazole  40 mg Oral Daily   sodium chloride flush  3 mL Intravenous Q12H   [START ON 06/06/2021] thiamine injection  100 mg Intravenous Daily   topiramate  75 mg Oral QHS   Continuous Infusions:  Immune Globulin 10% 20 g (05/30/21 1037)   thiamine injection     PRN Meds:.acetaminophen **OR** acetaminophen, ondansetron (ZOFRAN) IV, polyethylene glycol  Antibiotics  :    Anti-infectives (From admission, onward)    Start     Dose/Rate Route Frequency Ordered Stop   05/29/21 0038  hydroxychloroquine (PLAQUENIL) tablet 400 mg       Note to Pharmacy: 2 tabs by mouth once daily     400 mg Oral Daily 05/29/21 0042          Time Spent in minutes  30   Susa Raring M.D on 05/31/2021 at 10:19 AM  To page go to [email or web address]   [facility] -   Office  [phone]    See all Orders from today for further details    Objective:   Vitals:   05/30/21 2000 05/31/21 0000 05/31/21 0358 05/31/21 0713  BP: 106/62 110/68 102/65 104/60  Pulse: 69 60 60 70  Resp: Temp: 98.1 F (36.7 C) 97.7 F (36.5 C) 97.6 F (36.4 C) 97.8 F (36.6 C)  TempSrc: Oral Oral Oral Oral  SpO2: 100% 100% 100% 99%  Weight:      Height:        Wt Readings from Last 3 Encounters:  05/30/21 54 kg  05/16/21 54.4 kg  05/15/21 53.5 kg     Intake/Output Summary (Last 24 hours) at 05/31/2021 1019 Last data filed at 05/31/2021 0400 Gross per 24 hour  Intake 1300.96 ml  Output --  Net 1300.96 ml      Physical Exam  Awake Alert, No new F.N deficits but does have generalized weakness worse in L.leg, Normal affect [place].AT,PERRAL Supple Neck,No JVD, No cervical lymphadenopathy appriciated.  Symmetrical Chest wall movement, Good air movement bilaterally, CTAB RRR,No Gallops, Rubs or new Murmurs, No Parasternal Heave +ve B.Sounds, Abd Soft, No tenderness, No organomegaly appriciated, No rebound - guarding or rigidity. No Cyanosis, Clubbing or  edema, No new Rash or bruise    Data Review:    CBC Recent Labs  Lab 05/28/21 1907 05/29/21 0520 05/30/21 0258 05/31/21 0421  WBC 5.7 3.2* 3.6* 3.1*  HGB 12.2 11.0* 11.6* 10.4*  HCT 36.1 31.2* 33.4* 30.6*  PLT 171 151 177 164  MCV 88.9 86.9 87.9 87.9  MCH 30.0 30.6 30.5 29.9  MCHC 33.8 35.3 34.7 34.0  RDW 13.2 13.4 13.9 13.5  LYMPHSABS 0.9  --  1.6 1.4  MONOABS 0.3  --  0.3 0.2  EOSABS 0.0  --  0.0 0.0  BASOSABS 0.0  --  0.0 0.0    Recent Labs  Lab 05/28/21 1907 05/28/21 1925 05/28/21 2016 05/29/21 0105 05/29/21 0316 05/30/21 0258 05/31/21 0421  NA 138  --   --   --  139 137 136  K 3.8  --   --   --  3.1* 4.4 3.8  CL 112*  --   --   --  112* 111 111  CO2 18*  --   --   --  18* 22 22  GLUCOSE 93  --   --   --  86 81 85  BUN 1*  --   --   --  <5* <5* <5*  CREATININE 0.58   --   --   --  0.57 0.69 0.65  CALCIUM 7.8*  --   --   --  8.0* 8.3* 8.1*  AST 36  --   --   --   --  20 17  ALT 15  --   --   --   --  16 16  ALKPHOS 50  --   --   --   --  51 46  BILITOT 0.7  --   --   --   --  0.6 0.6  ALBUMIN 3.5  --   --  4.0  --  3.2* 2.8*  MG  --  1.8  --   --   --  2.1 1.7  CRP  --   --  <0.5  --   --   --   --   TSH  --   --   --   --  1.607  --   --     ------------------------------------------------------------------------------------------------------------------ No results for input(s): CHOL, HDL, LDLCALC, TRIG, CHOLHDL, LDLDIRECT in the last 72 hours.  Lab Results  Component Value Date   HGBA1C 5.2 03/04/2021   ------------------------------------------------------------------------------------------------------------------ Recent Labs    05/29/21 0316  TSH 1.607    Cardiac Enzymes No results for input(s): CKMB, TROPONINI, MYOGLOBIN in the last 168 hours.  Invalid input(s): CK ------------------------------------------------------------------------------------------------------------------ No results found for: BNP  Micro Results Recent Results (from the past 240 hour(s))  Resp Panel by RT-PCR (Flu A&B, Covid) Nasopharyngeal Swab     Status: None   Collection Time: 05/29/21 12:04 AM   Specimen: Nasopharyngeal Swab; Nasopharyngeal(NP) swabs in vial transport medium  Result Value Ref Range Status   SARS Coronavirus 2 by RT PCR NEGATIVE NEGATIVE Final    Comment: (NOTE) SARS-CoV-2 target nucleic acids are NOT DETECTED.  The SARS-CoV-2 RNA is generally detectable in upper respiratory specimens during the acute phase of infection. The lowest concentration of SARS-CoV-2 viral copies this assay can detect is 138 copies/mL. A negative result does not preclude SARS-Cov-2 infection and should not be used as the sole basis for treatment or other patient management decisions. A negative result may occur with  improper specimen collection/handling,  submission of specimen other than nasopharyngeal swab, presence of viral mutation(s) within the areas targeted by this assay, and inadequate number of viral copies(<138 copies/mL). A negative result must be combined with clinical observations, patient history, and epidemiological information. The expected result is Negative.  Fact Sheet for Patients:  BloggerCourse.com  Fact Sheet for Healthcare Providers:  SeriousBroker.it  This test is no t yet approved or cleared by the [place] FDA and  has been authorized for detection and/or diagnosis of SARS-CoV-2 by FDA under an Emergency Use Authorization (EUA). This EUA will remain  in effect (meaning this test can be used) for the duration of the COVID-19 declaration under Section 564(b)(1) of the Act, 21 U.S.C.section 360bbb-3(b)(1), unless the authorization is terminated  or revoked sooner.       Influenza A by PCR NEGATIVE NEGATIVE Final   Influenza B by PCR NEGATIVE NEGATIVE Final    Comment: (NOTE) The Xpert Xpress SARS-CoV-2/FLU/RSV plus assay is intended as an aid in the diagnosis of influenza from Nasopharyngeal swab specimens and should not be used as a sole basis for treatment. Nasal washings and aspirates are unacceptable for Xpert Xpress SARS-CoV-2/FLU/RSV testing.  Fact Sheet for Patients: BloggerCourse.com  Fact Sheet for Healthcare Providers: SeriousBroker.it  This test is not yet approved or cleared by the [place] FDA and has been authorized for detection and/or diagnosis of SARS-CoV-2 by FDA under an Emergency Use Authorization (EUA). This EUA will remain in effect (meaning this test can be used) for the duration of the COVID-19 declaration under Section 564(b)(1) of the Act, 21 U.S.C. section 360bbb-3(b)(1), unless the authorization is terminated or revoked.  Performed at [facility], [address] [place], [place] [address]   CSF culture w Stat Gram Stain     Status: None (Preliminary result)   Collection Time: 05/29/21  1:05 AM   Specimen: CSF; Cerebrospinal Fluid  Result Value Ref Range Status   Specimen Description CSF  Final   Special Requests NONE  Final   Gram Stain   Final    WBC PRESENT, PREDOMINANTLY MONONUCLEAR NO ORGANISMS SEEN CYTOSPIN SMEAR    Culture   Final    NO GROWTH 2 DAYS Performed at [facility], [address], [place], [place] [address]    Report Status PENDING  Incomplete    Radiology Reports MR Brain W and Wo Contrast  Result Date: 05/28/2021 CLINICAL DATA:  Left facial numbness EXAM: MRI HEAD WITHOUT AND WITH CONTRAST TECHNIQUE: Multiplanar, multiecho pulse sequences of the brain and surrounding structures were obtained without and with intravenous contrast. CONTRAST:  34mL GADAVIST GADOBUTROL 1 MMOL/ML IV SOLN COMPARISON:  None. FINDINGS: Brain: No acute infarct, mass effect or extra-axial collection. No acute or chronic hemorrhage. Normal white matter signal, parenchymal volume and CSF spaces. The midline structures are normal. Vascular: Major flow voids are preserved. Skull and upper cervical spine: Normal calvarium and skull base. Visualized upper cervical spine and soft tissues are normal. Sinuses/Orbits:No paranasal sinus fluid levels or advanced mucosal thickening. No mastoid or middle ear effusion. Normal orbits. IMPRESSION: Normal brain MRI. Electronically Signed   By: Deatra Robinson M.D.   On: 05/28/2021 23:06   MR Cervical Spine W or Wo Contrast  Result Date: 05/28/2021 CLINICAL DATA:  Left facial numbness and hand contracting. Migraine. EXAM: MRI CERVICAL SPINE WITHOUT AND WITH CONTRAST TECHNIQUE: Multiplanar and multiecho pulse sequences of the cervical spine, to include the craniocervical junction and cervicothoracic junction, were obtained without and  with intravenous contrast. CONTRAST:  66mL GADAVIST GADOBUTROL 1 MMOL/ML IV SOLN  COMPARISON:  None. FINDINGS: Alignment: Physiologic. Vertebrae: No fracture, evidence of discitis, or bone lesion. Cord: Normal signal and morphology. Posterior Fossa, vertebral arteries, paraspinal tissues: Negative. Disc levels: No spinal canal or neural foraminal stenosis. IMPRESSION: Normal MRI of the cervical spine. Electronically Signed   By: Deatra Robinson M.D.   On: 05/28/2021 23:13   MR FEMUR LEFT WO CONTRAST  Result Date: 05/30/2021 CLINICAL DATA:  Left greater than right leg weakness and numbness. 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 EXAM: MR OF THE LEFT FEMUR WITHOUT CONTRAST TECHNIQUE: Multiplanar, multisequence MR imaging of the left femur was performed. No intravenous contrast was administered. COMPARISON:  None. FINDINGS: Bones/Joint/Cartilage No marrow signal abnormality. No fracture or dislocation. Joint spaces are preserved. Trace bilateral knee joint effusions. Ligaments The bilateral hip labra are grossly intact. The bilateral knee ligaments and menisci are grossly intact. Muscles and Tendons Intact.  No muscle edema or atrophy. Soft tissue No fluid collection or hematoma. No soft tissue mass. The visualized pelvic structures are unremarkable. IMPRESSION: 1. Normal MRI of the left thigh. No evidence of myositis. Electronically Signed   By: Obie Dredge M.D.   On: 05/30/2021 08:01   DG Chest Port 1 View  Result Date: 05/28/2021 CLINICAL DATA:  Shortness of breath, left arm numbness and tingling. EXAM: PORTABLE CHEST 1 VIEW COMPARISON:  03/19/2021 FINDINGS: The heart size and mediastinal contours are within normal limits. Both lungs are clear. The visualized skeletal structures are unremarkable. IMPRESSION: No active disease. Electronically Signed   By: Burman Nieves M.D.   On: 05/28/2021 19:45

## 2021-05-31 NOTE — Progress Notes (Signed)
OT Cancellation Note  Patient Details Name: Audrey [name] MRN: [id number] DOB: 04-20-1981   Cancelled Treatment:    Reason Eval/Treat Not Completed: Medical issues which prohibited therapy. Pt not feeling well. Nsg asking for therapy to check on  pt at later time.   Thornell Mule, OT/L   Acute OT Clinical Specialist Acute Rehabilitation Services Pager [phone] Office [phone]  05/31/2021, 11:25 AM

## 2021-05-31 NOTE — Progress Notes (Signed)
Neurology Progress Note  S: Patient is tired and sad over diagnosis. Her husband states she is withdrawn over her normal state of health. C/o chest pressure without radiation. Some SOB but not necessarily related to the CP. No GERD symptoms. She is generally weak but more on the left side. Numbness and tingling to left face, LUE, LLE. States she is dizzy, lying, sitting, and standing. She has both blurry and double vision which goes away with closing of either eye.   Spanish language interpretor used.   O: Current vital signs: BP 104/60 (BP Location: Left Arm)   Pulse 70   Temp 97.8 F (36.6 C) (Oral)   Resp 16   Ht 4' 11.5" (1.511 m)   Wt 54 kg   LMP 05/03/2021   SpO2 99%   BMI 23.64 kg/m  Vital signs in last 24 hours: Temp:  [97.4 F (36.3 C)-98.1 F (36.7 C)] 97.8 F (36.6 C) (07/08 0713) Pulse Rate:  [59-77] 70 (07/08 0713) Resp:  [16-22] 16 (07/08 0713) BP: (99-111)/(60-69) 104/60 (07/08 0713) SpO2:  [99 %-100 %] 99 % (07/08 0713)  GENERAL: Fairly well appearing. Awake, alert in NAD. HEENT: Normocephalic and atraumatic. Tenderness with palpation of trapezius, neck, posterior head and left lateral head.  LUNGS: Normal respiratory effort.  CV: RRR on tele.  Ext: warm. Psych: flat, withdrawn.  NEURO:  Mental Status: Alert withdrawn, solemn. Oriented x3.  Speech/Language: speech is without aphasia or dysarthria.  Naming, repetition, fluency, and comprehension intact.  Cranial Nerves:  II: PERRL. 50mm. Visual fields full. OD-responds 3 fingers when 2 are held up in all fields. OS with 2 fingers when 1 held up in the lateral field.   III, IV, VI: EOMI. Eyelids elevate symmetrically. No gaze preference.  V: Sensation is intact to light touch to face but decreased on the left.   VII: Smile is symmetrical.  VIII: hearing intact to voice. IX, X: Palate elevates symmetrically. Phonation is normal.  XI: Shoulder shrug 4/5. XII: tongue is midline without fasciculations. Motor:   RUE: grip 4/5       bicep 4/5     tricep 4/5 RLE: thigh 4+/5    knee 4+5    plantar flexion 4/5    dorsiflexion 4/5 LUE: grip 4-/5   bicep  4-/5   tricep 4-/5 LLE: thigh 4+/5    knee 4+/5   plantar flexion 4/5   dorsiflexion 4/5 Tone: is normal and bulk is normal. Sensation- Intact to light touch bilaterally but decreased in the LLE. Extinction absent to light touch to DSS.    Coordination: Tremors noted with UE during strength exam. Slowed movements throughout but no ataxia out of proportion to weakness.   DTR: RUE: brachioradialis 0       biceps 0  triceps 0 RLE:  patella  0     Tibial  0    LUE:  brachioradialis  0   biceps 0  triceps 0 LLE:  patella  0    Tibial  0 Plantars: mute Gait- deferred.  Medications  Current Facility-Administered Medications:    acetaminophen (TYLENOL) tablet 650 mg, 650 mg, Oral, Q6H PRN, 650 mg at 05/30/21 2156 **OR** acetaminophen (TYLENOL) suppository 650 mg, 650 mg, Rectal, Q6H PRN, Synetta Fail, MD   enoxaparin (LOVENOX) injection 40 mg, 40 mg, Subcutaneous, Q24H, Leroy Sea, MD, 40 mg at 05/30/21 1709   hydroxychloroquine (PLAQUENIL) tablet 400 mg, 400 mg, Oral, Daily, Synetta Fail, MD, 400 mg at 05/30/21 [phone]  Immune Globulin 10% (PRIVIGEN) IV infusion 20 g, 400 mg/kg, Intravenous, Daily, Erick Blinks, MD, Last Rate: 16.2 mL/hr at 05/30/21 1037, 20 g at 05/30/21 1037   lactated ringers infusion, , Intravenous, Continuous, Leroy Sea, MD, Last Rate: 75 mL/hr at 05/30/21 2159, New Bag at 05/30/21 2159   ondansetron [facility]) injection 4 mg, 4 mg, Intravenous, Q6H PRN, Leroy Sea, MD, 4 mg at 05/30/21 0959   pantoprazole (PROTONIX) EC tablet 40 mg, 40 mg, Oral, Daily, Synetta Fail, MD, 40 mg at 05/30/21 0959   polyethylene glycol (MIRALAX / GLYCOLAX) packet 17 g, 17 g, Oral, Daily PRN, Synetta Fail, MD   sodium chloride flush (NS) 0.9 % injection 3 mL, 3 mL, Intravenous, Q12H, Synetta Fail,  MD, 3 mL at 05/30/21 1107   [EXPIRED] thiamine 500mg  in normal saline (4ml) IVPB, 500 mg, Intravenous, Q8H, Last Rate: 100 mL/hr at 05/30/21 1413, 500 mg at 05/30/21 1413 **FOLLOWED BY** thiamine (B-1) 250 mg in sodium chloride 0.9 % 50 mL IVPB, 250 mg, Intravenous, Daily **FOLLOWED BY** [START ON 06/06/2021] thiamine (B-1) injection 100 mg, 100 mg, Intravenous, Daily, 06/08/2021, MD   topiramate (TOPAMAX) tablet 75 mg, 75 mg, Oral, QHS, Erick Blinks, MD, 75 mg at 05/30/21 2153  Pertinent Labs Vitamin B12 1145. TSH normal. HIV NR.  CSF culture and gram stain. NTD x 1 day.   No new Imaging  Assessment: 40 yo female who presented on 05/28/21 with gradually progressive bilateral hand weakness and numbness, weakness and numbness in LUE>RUE. 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 Neuropathy is likely related to her systemic rheumatological illness vs. Nutritional deficits, however, Vit B12 and TSH are normal.   Impression: -Rheumatic overlap syndrome. 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  -chest pressure.   Recommendations/Plan:  -Continue IVIG qd, last day on 05/03/21.  -Await rest of CSF labs.  -Await B6, MMA, and electrophoresis.  -Start MVI i po qd.  -Defer to medicine team any depression medications.  -Neurology will continue to follow.   NP messaged P. 07/03/21, MD re: chest pain.   Pt seen by Thedore Mins, MSN, APN-BC/Nurse Practitioner/Neuro and later by MD. Note and plan to be edited as needed by MD.  Pager: Jimmye Norman   I have seen the patient reviewed the above note.  Her effort varies, but I think overall her strength exam continues to be relatively stable.  She is not seeing much improvement yet, but it is likely day three of IVIG.  I suspect that some of her heart rate changes and orthostasis are related to autonomic involvement of her  acute neuropathy which would be suggestive of Guillain-Barr.    Due to headaches an MR venogram was performed to rule out venous sinus thrombosis in the setting of a thrombophilia associated with IVIG, and this is negative.    Neurological continue to follow.  [phone], MD [facility] [phone]  If 7pm- 7am, please page neurology on call as listed in AMION.

## 2021-06-01 ENCOUNTER — Inpatient Hospital Stay (HOSPITAL_COMMUNITY): Payer: Self-pay

## 2021-06-01 LAB — COMPREHENSIVE METABOLIC PANEL
ALT: 17 U/L (ref 0–44)
AST: 22 U/L (ref 15–41)
Albumin: 3.1 g/dL — ABNORMAL LOW (ref 3.5–5.0)
Alkaline Phosphatase: 48 U/L (ref 38–126)
Anion gap: 6 (ref 5–15)
BUN: <5 mg/dL — ABNORMAL LOW (ref 6–20)
CO2: 21 mmol/L — ABNORMAL LOW (ref 22–32)
Calcium: 8.4 mg/dL — ABNORMAL LOW (ref 8.9–10.3)
Chloride: 109 mmol/L (ref 98–111)
Creatinine, Ser: 0.58 mg/dL (ref 0.44–1.00)
GFR, Estimated: >60 mL/min (ref >60)
Glucose, Bld: 77 mg/dL (ref 70–99)
Potassium: 3.3 mmol/L — ABNORMAL LOW (ref 3.5–5.1)
Sodium: 136 mmol/L (ref 135–145)
Total Bilirubin: 0.6 mg/dL (ref 0.3–1.2)
Total Protein: 7.2 g/dL (ref 6.5–8.1)

## 2021-06-01 LAB — CSF CULTURE W GRAM STAIN: Culture: NO GROWTH

## 2021-06-01 LAB — CBC WITH DIFFERENTIAL/PLATELET
Abs Immature Granulocytes: 0.01 10*3/uL (ref 0.00–0.07)
Basophils Absolute: 0 10*3/uL (ref 0.0–0.1)
Basophils Relative: 1 %
Eosinophils Absolute: 0 10*3/uL (ref 0.0–0.5)
Eosinophils Relative: 1 %
HCT: 33 % — ABNORMAL LOW (ref 36.0–46.0)
Hemoglobin: 11.3 g/dL — ABNORMAL LOW (ref 12.0–15.0)
Immature Granulocytes: 0 %
Lymphocytes Relative: 53 %
Lymphs Abs: 1.8 10*3/uL (ref 0.7–4.0)
MCH: 30.4 pg (ref 26.0–34.0)
MCHC: 34.2 g/dL (ref 30.0–36.0)
MCV: 88.7 fL (ref 80.0–100.0)
Monocytes Absolute: 0.3 10*3/uL (ref 0.1–1.0)
Monocytes Relative: 7 %
Neutro Abs: 1.3 10*3/uL — ABNORMAL LOW (ref 1.7–7.7)
Neutrophils Relative %: 38 %
Platelets: 162 10*3/uL (ref 150–400)
RBC: 3.72 MIL/uL — ABNORMAL LOW (ref 3.87–5.11)
RDW: 13.4 % (ref 11.5–15.5)
WBC: 3.4 10*3/uL — ABNORMAL LOW (ref 4.0–10.5)
nRBC: 0 % (ref 0.0–0.2)

## 2021-06-01 LAB — MISC LABCORP TEST (SEND OUT): Labcorp test code: 138289

## 2021-06-01 LAB — MAGNESIUM: Magnesium: 1.9 mg/dL (ref 1.7–2.4)

## 2021-06-01 MED ORDER — DIPHENHYDRAMINE HCL 50 MG/ML IJ SOLN
12.5000 mg | Freq: Once | INTRAMUSCULAR | Status: AC
  Administered 2021-06-01: 12.5 mg via INTRAVENOUS
  Filled 2021-06-01: qty 1

## 2021-06-01 MED ORDER — POTASSIUM CHLORIDE 2 MEQ/ML IV SOLN
INTRAVENOUS | Status: AC
  Filled 2021-06-01: qty 1000

## 2021-06-01 MED ORDER — IMMUNE GLOBULIN (HUMAN) 10 GM/100ML IV SOLN
400.0000 mg/kg | INTRAVENOUS | Status: AC
  Administered 2021-06-01 – 2021-06-02 (×2): 20 g via INTRAVENOUS
  Filled 2021-06-01 (×2): qty 200

## 2021-06-01 MED ORDER — POTASSIUM CHLORIDE CRYS ER 20 MEQ PO TBCR
40.0000 meq | EXTENDED_RELEASE_TABLET | Freq: Once | ORAL | Status: AC
  Administered 2021-06-01: 40 meq via ORAL
  Filled 2021-06-01: qty 2

## 2021-06-01 MED ORDER — PROCHLORPERAZINE EDISYLATE 10 MG/2ML IJ SOLN
10.0000 mg | Freq: Once | INTRAMUSCULAR | Status: AC
  Administered 2021-06-01: 10 mg via INTRAVENOUS
  Filled 2021-06-01: qty 2

## 2021-06-01 NOTE — Progress Notes (Signed)
PROGRESS NOTE                                                                                                                                                                                                             Patient Demographics:    Audrey [name], is a 40 y.o. female, DOB - 04/26/1981, JOI:[id number]  Outpatient Primary MD for the patient is Julieanne Manson, MD    LOS - 3  Admit date - 05/28/2021    Chief Complaint  Patient presents with   Numbness       Brief Narrative (HPI from H&P) - Audrey [name] is a 40 y.o. 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 No new focal weakness.   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  Monitor final CSF studies along with B1.   2.  Lupus.  Currently on Plaquenil has been off of methotrexate for 2 weeks prior to admission, defer this to primary rheumatologist.   3.  History of migraines.  Continue Topamax.  4.  GERD.  On PPI continue.  5.  Prolonged QT at 499 ms.  Resolved after replacing potassium.   6.  Hypokalemia.  Replaced.  7.  Atypical chest pressure  on 05/31/2021.  Stable EKG, not tachycardic, no shortness of breath, trial of Maalox 2 doses, stable TTE as well, EF 60% no WMA.       Condition - Fair  Family Communication  :  None  Code Status :  Ful  Consults  :  Neuro  PUD  Prophylaxis : PPI   Procedures  :     TTE - 1. Left ventricular ejection fraction, by estimation, is 60 to 65%. The left ventricle has normal function. The left ventricle has no regional wall motion abnormalities. Left ventricular diastolic parameters were normal.  2. Right ventricular systolic function is normal. The right ventricular size is normal. Tricuspid regurgitation signal is inadequate for assessing PA pressure.  3. The mitral valve is normal in structure. No evidence of mitral valve regurgitation. No evidence of mitral stenosis.  4. The aortic valve was not well visualized. Aortic valve regurgitation is not visualized. No aortic stenosis is present.  5. The inferior vena cava is normal in size with <50% respiratory variability, suggesting right atrial pressure of 8 mmHg.  MRI Brain and C Spine - non acute  LP - unremarkable CSF        Disposition Plan  :    Dispo: The patient is from: Home              Anticipated d/c is to: Home              Patient currently is not medically stable to d/c.   Difficult to place patient No  DVT Prophylaxis  :    Place TED hose Start: 05/31/21 1140 enoxaparin (LOVENOX) injection 40 mg Start: 05/29/21 1600 SCDs Start: 05/29/21 0041    Lab Results  Component Value Date   PLT 162 06/01/2021    Diet :  Diet Order             Diet Heart Room service appropriate? Yes; Fluid consistency: Thin  Diet effective now                    Inpatient Medications  Scheduled Meds:  enoxaparin (LOVENOX) injection  40 mg Subcutaneous Q24H   hydroxychloroquine  400 mg Oral Daily   pantoprazole  40 mg Oral Daily   sodium chloride flush  3 mL Intravenous Q12H   [START ON 06/06/2021] thiamine injection  100 mg  Intravenous Daily   topiramate  75 mg Oral QHS   Continuous Infusions:  Immune Globulin 10% Stopped (06/01/21 0029)   lactated ringers with kcl     thiamine injection     PRN Meds:.acetaminophen **OR** acetaminophen, ondansetron (ZOFRAN) IV, polyethylene glycol  Antibiotics  :    Anti-infectives (From admission, onward)    Start     Dose/Rate Route Frequency Ordered Stop   05/29/21 0038  hydroxychloroquine (PLAQUENIL) tablet 400 mg       Note to Pharmacy: 2 tabs by mouth once daily     400 mg Oral Daily 05/29/21 0042          Time Spent in minutes  30   Susa Raring M.D on 06/01/2021 at 8:36 AM  To page go to [email or web address]   [facility] -  Office  [phone]    See all Orders from today for further details    Objective:   Vitals:   05/31/21 2314 05/31/21 2330 06/01/21 0032 06/01/21 0327  BP: 112/69 109/72 112/68 123/66  Pulse:    69  Resp: Temp: 98.3 F (36.8 C) 98.3 F (36.8 C) 98.3 F (36.8 C) 97.6 F (36.4 C)  TempSrc: Oral Oral Oral Oral  SpO2:    98%  Weight:      Height:        Wt Readings from Last 3 Encounters:  05/30/21 54 kg  05/16/21 54.4 kg  05/15/21 53.5 kg     Intake/Output Summary (Last 24 hours) at 06/01/2021 0836 Last data filed at 05/31/2021 2200 Gross per 24 hour  Intake 720 ml  Output --  Net 720 ml      Physical Exam  Awake Alert, No new F.N deficits but does have generalized weakness worse in L.leg,   [place].AT,PERRAL Supple Neck,No JVD, No cervical lymphadenopathy appriciated.  Symmetrical Chest wall movement, Good air movement bilaterally, CTAB RRR,No Gallops, Rubs or new Murmurs, No Parasternal Heave +ve B.Sounds, Abd Soft, No tenderness, No organomegaly appriciated, No rebound - guarding or rigidity. No Cyanosis, Clubbing or edema, No new Rash or bruise    Data Review:    CBC Recent Labs  Lab 05/28/21 1907 05/29/21 0520 05/30/21 0258 05/31/21 0421 06/01/21 0250  WBC 5.7 3.2* 3.6* 3.1*  3.4*  HGB 12.2 11.0* 11.6* 10.4* 11.3*  HCT 36.1 31.2* 33.4* 30.6* 33.0*  PLT 171 151 177 164 162  MCV 88.9 86.9 87.9 87.9 88.7  MCH 30.0 30.6 30.5 29.9 30.4  MCHC 33.8 35.3 34.7 34.0 34.2  RDW 13.2 13.4 13.9 13.5 13.4  LYMPHSABS 0.9  --  1.6 1.4 1.8  MONOABS 0.3  --  0.3 0.2 0.3  EOSABS 0.0  --  0.0 0.0 0.0  BASOSABS 0.0  --  0.0 0.0 0.0    Recent Labs  Lab 05/28/21 1907 05/28/21 1925 05/28/21 2016 05/29/21 0105 05/29/21 0316 05/30/21 0258 05/31/21 0421 06/01/21 0250  NA 138  --   --   --  139 137 136 136  K 3.8  --   --   --  3.1* 4.4 3.8 3.3*  CL 112*  --   --   --  112* 111 111 109  CO2 18*  --   --   --  18* 22 22 21*  GLUCOSE 93  --   --   --  86 81 85 77  BUN 1*  --   --   --  <5* <5* <5* <5*  CREATININE 0.58  --   --   --  0.57 0.69 0.65 0.58  CALCIUM 7.8*  --   --   --  8.0* 8.3* 8.1* 8.4*  AST 36  --   --   --   --  20 17 22   ALT 15  --   --   --   --  16 16 17   ALKPHOS 50  --   --   --   --  51 46 48  BILITOT 0.7  --   --   --   --  0.6 0.6 0.6  ALBUMIN 3.5  --   --  4.0  --  3.2* 2.8* 3.1*  MG  --  1.8  --   --   --  2.1 1.7 1.9  CRP  --   --  <0.5  --   --   --   --   --   TSH  --   --   --   --  1.607  --   --   --     ------------------------------------------------------------------------------------------------------------------ No results for input(s): CHOL, HDL, LDLCALC, TRIG, CHOLHDL, LDLDIRECT in the last 72 hours.  Lab Results  Component Value Date   HGBA1C 5.2 03/04/2021   ------------------------------------------------------------------------------------------------------------------ No results for input(s): TSH, T4TOTAL, T3FREE, THYROIDAB in the last 72 hours.  Invalid input(s): FREET3   Cardiac Enzymes No results for input(s): CKMB, TROPONINI, MYOGLOBIN in  the last 168 hours.  Invalid input(s): CK ------------------------------------------------------------------------------------------------------------------ No results found  for: BNP  Micro Results Recent Results (from the past 240 hour(s))  Resp Panel by RT-PCR (Flu A&B, Covid) Nasopharyngeal Swab     Status: None   Collection Time: 05/29/21 12:04 AM   Specimen: Nasopharyngeal Swab; Nasopharyngeal(NP) swabs in vial transport medium  Result Value Ref Range Status   SARS Coronavirus 2 by RT PCR NEGATIVE NEGATIVE Final    Comment: (NOTE) SARS-CoV-2 target nucleic acids are NOT DETECTED.  The SARS-CoV-2 RNA is generally detectable in upper respiratory specimens during the acute phase of infection. The lowest concentration of SARS-CoV-2 viral copies this assay can detect is 138 copies/mL. A negative result does not preclude SARS-Cov-2 infection and should not be used as the sole basis for treatment or other patient management decisions. A negative result may occur with  improper specimen collection/handling, submission of specimen other than nasopharyngeal swab, presence of viral mutation(s) within the areas targeted by this assay, and inadequate number of viral copies(<138 copies/mL). A negative result must be combined with clinical observations, patient history, and epidemiological information. The expected result is Negative.  Fact Sheet for Patients:  BloggerCourse.com  Fact Sheet for Healthcare Providers:  SeriousBroker.it  This test is no t yet approved or cleared by the [place] FDA and  has been authorized for detection and/or diagnosis of SARS-CoV-2 by FDA under an Emergency Use Authorization (EUA). This EUA will remain  in effect (meaning this test can be used) for the duration of the COVID-19 declaration under Section 564(b)(1) of the Act, 21 U.S.C.section 360bbb-3(b)(1), unless the authorization is terminated  or revoked sooner.       Influenza A by PCR NEGATIVE NEGATIVE Final   Influenza B by PCR NEGATIVE NEGATIVE Final    Comment: (NOTE) The Xpert Xpress SARS-CoV-2/FLU/RSV plus  assay is intended as an aid in the diagnosis of influenza from Nasopharyngeal swab specimens and should not be used as a sole basis for treatment. Nasal washings and aspirates are unacceptable for Xpert Xpress SARS-CoV-2/FLU/RSV testing.  Fact Sheet for Patients: BloggerCourse.com  Fact Sheet for Healthcare Providers: SeriousBroker.it  This test is not yet approved or cleared by the [place] FDA and has been authorized for detection and/or diagnosis of SARS-CoV-2 by FDA under an Emergency Use Authorization (EUA). This EUA will remain in effect (meaning this test can be used) for the duration of the COVID-19 declaration under Section 564(b)(1) of the Act, 21 U.S.C. section 360bbb-3(b)(1), unless the authorization is terminated or revoked.  Performed at [facility], [address] [place], [place] [address]   CSF culture w Stat Gram Stain     Status: None (Preliminary result)   Collection Time: 05/29/21  1:05 AM   Specimen: CSF; Cerebrospinal Fluid  Result Value Ref Range Status   Specimen Description CSF  Final   Special Requests NONE  Final   Gram Stain   Final    WBC PRESENT, PREDOMINANTLY MONONUCLEAR NO ORGANISMS SEEN CYTOSPIN SMEAR    Culture   Final    NO GROWTH 2 DAYS Performed at [facility], [address] [place], [place] [address]    Report Status PENDING  Incomplete    Radiology Reports MR Brain W and Wo Contrast  Result Date: 05/28/2021 CLINICAL DATA:  Left facial numbness EXAM: MRI HEAD WITHOUT AND WITH CONTRAST TECHNIQUE: Multiplanar, multiecho pulse sequences of the brain and surrounding structures were obtained without and with intravenous contrast. CONTRAST:  21mL GADAVIST GADOBUTROL 1 MMOL/ML IV SOLN COMPARISON:  None. FINDINGS: Brain: No acute infarct, mass effect or extra-axial collection. No acute or chronic hemorrhage. Normal white matter signal, parenchymal volume and CSF spaces. The  midline structures are normal. Vascular: Major flow voids are preserved. Skull and upper cervical spine: Normal calvarium and skull base. Visualized upper cervical spine and soft tissues are normal. Sinuses/Orbits:No paranasal sinus fluid levels or advanced mucosal thickening. No mastoid or middle ear effusion. Normal orbits. IMPRESSION: Normal brain MRI. Electronically Signed   By: Deatra Robinson M.D.   On: 05/28/2021 23:06   MR Cervical Spine W or Wo Contrast  Result Date: 05/28/2021 CLINICAL DATA:  Left facial numbness and hand contracting. Migraine. EXAM: MRI CERVICAL SPINE WITHOUT AND WITH CONTRAST TECHNIQUE: Multiplanar and multiecho pulse sequences of the cervical spine, to include the craniocervical junction and cervicothoracic junction, were obtained without and with intravenous contrast. CONTRAST:  38mL GADAVIST GADOBUTROL 1 MMOL/ML IV SOLN COMPARISON:  None. FINDINGS: Alignment: Physiologic. Vertebrae: No fracture, evidence of discitis, or bone lesion. Cord: Normal signal and morphology. Posterior Fossa, vertebral arteries, paraspinal tissues: Negative. Disc levels: No spinal canal or neural foraminal stenosis. IMPRESSION: Normal MRI of the cervical spine. Electronically Signed   By: Deatra Robinson M.D.   On: 05/28/2021 23:13   MR Venogram Head  Result Date: 05/31/2021 CLINICAL DATA:  Dural venous sinus thrombosis suspected.  Headache. EXAM: MRI head venogram. TECHNIQUE: Multiplanar, multi-echo pulse sequences of the brain and surrounding structures were acquired without intravenous contrast. Images of the dural venous sinuses were acquired using MRV technique without intravenous contrast. COMPARISON:  MRI head May 28, 2021. FINDINGS: No evidence of dural venous sinus thrombosis. Patent superior sagittal sinus, transverse and sigmoid sinuses, jugular bulbs, and straight sinus. The visualized deep cerebral veins and cortical draining veins are patent. Discrete, rounded filling defects within the  superior sagittal sinus and distal left transverse sinus are compatible with arachnoid granulations. IMPRESSION: No evidence of dural venous sinus thrombosis. Electronically Signed   By: Feliberto Harts MD   On: 05/31/2021 17:34   MR FEMUR LEFT WO CONTRAST  Result Date: 05/30/2021 CLINICAL DATA:  Left greater than right leg weakness and numbness. 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 EXAM: MR OF THE LEFT FEMUR WITHOUT CONTRAST TECHNIQUE: Multiplanar, multisequence MR imaging of the left femur was performed. No intravenous contrast was administered. COMPARISON:  None. FINDINGS: Bones/Joint/Cartilage No marrow signal abnormality. No fracture or dislocation. Joint spaces are preserved. Trace bilateral knee joint effusions. Ligaments The bilateral hip labra are grossly intact. The bilateral knee ligaments and menisci are grossly intact. Muscles and Tendons Intact.  No muscle edema or atrophy. Soft tissue No fluid collection or hematoma. No soft tissue mass. The visualized pelvic structures are unremarkable. IMPRESSION: 1. Normal MRI of the left thigh. No evidence of myositis. Electronically Signed   By: Obie Dredge M.D.   On: 05/30/2021 08:01   DG Chest Port 1 View  Result Date: 05/31/2021 CLINICAL DATA:  Shortness of breath. EXAM: PORTABLE CHEST 1 VIEW COMPARISON:  05/28/2021 FINDINGS: Both lungs are clear. Negative for a pneumothorax. Heart and mediastinum are within normal limits. Trachea is midline. No acute bone abnormality. Cholecystectomy clips. IMPRESSION: No active disease. Electronically Signed   By: Richarda Overlie M.D.   On: 05/31/2021 10:52   DG Chest Port 1 View  Result Date: 05/28/2021 CLINICAL DATA:  Shortness of breath, left arm numbness and tingling. EXAM: PORTABLE CHEST 1 VIEW COMPARISON:  03/19/2021  FINDINGS: The heart size and mediastinal contours are within normal limits. Both lungs are clear. The visualized skeletal structures are unremarkable. IMPRESSION: No active  disease. Electronically Signed   By: Burman NievesWilliam  Stevens M.D.   On: 05/28/2021 19:45   ECHOCARDIOGRAM COMPLETE  Result Date: 05/31/2021    ECHOCARDIOGRAM REPORT   Patient Name:   Audrey [name] Date of Exam: 05/31/2021 Medical Rec #:  [id number]              Height:       59.5 in Accession #:    [id number]             Weight:       119.0 lb Date of Birth:  05/21/1981              BSA:          1.488 m Patient Age:    39 years               BP:           116/66 mmHg Patient Gender: F                      HR:           70 bpm. Exam Location:  Inpatient Procedure: 2D Echo Indications:   acute diastolic chf  History:       Patient has no prior history of Echocardiogram examinations.                Lupus.  Sonographer:   Delcie RochLauren Pennington Referring      [address] [facility]: IMPRESSIONS  1. Left ventricular ejection fraction, by estimation, is 60 to 65%. The left ventricle has normal function. The left ventricle has no regional wall motion abnormalities. Left ventricular diastolic parameters were normal.  2. Right ventricular systolic function is normal. The right ventricular size is normal. Tricuspid regurgitation signal is inadequate for assessing PA pressure.  3. The mitral valve is normal in structure. No evidence of mitral valve regurgitation. No evidence of mitral stenosis.  4. The aortic valve was not well visualized. Aortic valve regurgitation is not visualized. No aortic stenosis is present.  5. The inferior vena cava is normal in size with <50% respiratory variability, suggesting right atrial pressure of 8 mmHg. FINDINGS  Left Ventricle: Left ventricular ejection fraction, by estimation, is 60 to 65%. The left ventricle has normal function. The left ventricle has no regional wall motion abnormalities. The left ventricular internal cavity size was normal in size. There is  no left ventricular hypertrophy. Left ventricular diastolic parameters were normal. Right Ventricle: The right ventricular size is  normal. No increase in right ventricular wall thickness. Right ventricular systolic function is normal. Tricuspid regurgitation signal is inadequate for assessing PA pressure. Left Atrium: Left atrial size was normal in size. Right Atrium: Right atrial size was normal in size. Pericardium: There is no evidence of pericardial effusion. Mitral Valve: The mitral valve is normal in structure. No evidence of mitral valve regurgitation. No evidence of mitral valve stenosis. Tricuspid Valve: The tricuspid valve is normal in structure. Tricuspid valve regurgitation is not demonstrated. Aortic Valve: The aortic valve was not well visualized. Aortic valve regurgitation is not visualized. No aortic stenosis is present. Pulmonic Valve: The pulmonic valve was not well visualized. Pulmonic valve regurgitation is not visualized. Aorta: The aortic root is normal in size and structure. Venous: The inferior vena cava is normal in size with  less than 50% respiratory variability, suggesting right atrial pressure of 8 mmHg. IAS/Shunts: The interatrial septum was not well visualized.  LEFT VENTRICLE PLAX 2D LVIDd:         4.20 cm  Diastology LVIDs:         2.50 cm  LV e' medial:    15.70 cm/s LV PW:         0.70 cm  LV E/e' medial:  5.7 LV IVS:        0.70 cm  LV e' lateral:   23.50 cm/s LVOT diam:     1.80 cm  LV E/e' lateral: 3.8 LV SV:         54 LV SV Index:   37 LVOT Area:     2.54 cm  RIGHT VENTRICLE             IVC RV S prime:     16.20 cm/s  IVC diam: 1.60 cm TAPSE (M-mode): 2.3 cm LEFT ATRIUM             Index       RIGHT ATRIUM          Index LA diam:        2.60 cm 1.75 cm/m  RA Area:     9.52 cm LA Vol (A2C):   24.9 ml 16.73 ml/m RA Volume:   19.60 ml 13.17 ml/m LA Vol (A4C):   22.1 ml 14.85 ml/m LA Biplane Vol: 24.5 ml 16.47 ml/m  AORTIC VALVE LVOT Vmax:   113.00 cm/s LVOT Vmean:  70.200 cm/s LVOT VTI:    0.214 m  AORTA Ao Root diam: 2.50 cm MITRAL VALVE MV Area (PHT): 3.65 cm    SHUNTS MV Decel Time: 208 msec     Systemic VTI:  0.21 m MV E velocity: 89.60 cm/s  Systemic Diam: 1.80 cm MV A velocity: 86.50 cm/s MV E/A ratio:  1.04 Epifanio Lesches MD Electronically signed by Epifanio Lesches MD Signature Date/Time: 05/31/2021/6:46:15 PM    Final

## 2021-06-01 NOTE — Progress Notes (Signed)
NEUROLOGY CONSULTATION PROGRESS NOTE   Date of service: June 01, 2021 Patient Name: Audrey [name] MRN:  [id number] DOB:  10-10-1981  Interval Hx   She feels no significant change Vitals   Vitals:   06/01/21 0032 06/01/21 0327 06/01/21 0850 06/01/21 1124  BP: 112/68 123/66 116/69 107/60  Pulse:  69 79 81  Resp: _0 Temp: 98.3 F (36.8 C) 97.6 F (36.4 C) 98.5 F (36.9 C) 98.8 F (37.1 C)  TempSrc: Oral Oral Oral Oral  SpO2:  98% 100% 100%  Weight:      Height:         Body mass index is 23.64 kg/m.  Physical Exam   General: sitting in chair; in no acute distress.  Skin: No rash. Normal palpation of skin.    Neurologic Examination  Mental status/Cognition: Alert, interactive and appropriate speech/language: Fluent, comprehension intact,  Cranial nerves:   CN II Pupils equal and reactive to light, no VF deficits    CN III,IV,VI EOM intact, no gaze preference or deviation, no nystagmus    CN V She has mildly decreased sensation in the preauricular area on the left   CN VII no asymmetry, no nasolabial fold flattening    CN VIII normal hearing to speech    CN IX & X normal palatal elevation, no uvular deviation          Motor:  Muscle bulk: Normal, tone normal, pronator drift absent tremor?  Mildly enhanced physiological tremor Mvmt Root Nerve  Muscle Right Left Comments  SA C5/6 Ax Deltoid 4+ 4+   EF C5/6 Mc Biceps 4+ 4+   EE C6/7/8 Rad Triceps 4 4   WF C6/7 Med FCR 4+ 4+   WE C7/8 PIN ECU 4+ 4+           HF L1/2/3 Fem Illopsoas 4 4           DF L4/5 D Peron Tib Ant 4 4   PF S1/2 Tibial Grc/Sol 4 4    Reflexes: Absent throughout  Sensation: Decreased to LT in the lateral aspect of the left lower leg and lateral aspect of the proximal left arm.   Coordination/Complex Motor: Enhanced physiological tremor   Labs   TSH 1.6 RPR-nonreactive B6-normal B1-pending B12-1145 MMA-normal Folate 35.7 (normal) ESR and CRP were normal  CSF  WBC and RBC were both zero CSF protein-normal at 19 CSF glucose-53 CSF HIV PCR-negative CSF VDRL-nonreactive  Imaging and Diagnostic studies   MRV - negative  Impression   Audrey [name] is a 40 y.o. 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  The distribution of the numbness is quite unusual as is the time course of the weakness.  Given her strong history of autoimmune disease, decision was made to proceed with IVIG treatment, thus far she has not noticed much effect.  I would be hesitant to pursue much more aggressive autoimmune therapy without further objective evidence of the process that is ongoing.  I think that after IVIG treatment, I would favor having her follow-up with both rheumatology as well as an outpatient neuromuscular expert for an outpatient EMG and to monitor for IVIG response.  Recommendations  1) continue IVIG treatment today is 4/5 2) we will try Compazine/Benadryl for headache ______________________________________________________________________   Thank you for the opportunity to take part in the care of this patient. If you have  any further questions, please contact the neurology consultation attending.  Signed,  Roland Rack, MD [facility] [phone]  If 7pm- 7am, please page neurology on call as listed in [place].

## 2021-06-02 LAB — COMPREHENSIVE METABOLIC PANEL
ALT: 35 U/L (ref 0–44)
AST: 52 U/L — ABNORMAL HIGH (ref 15–41)
Albumin: 3 g/dL — ABNORMAL LOW (ref 3.5–5.0)
Alkaline Phosphatase: 48 U/L (ref 38–126)
Anion gap: 4 — ABNORMAL LOW (ref 5–15)
BUN: <5 mg/dL — ABNORMAL LOW (ref 6–20)
CO2: 23 mmol/L (ref 22–32)
Calcium: 8.5 mg/dL — ABNORMAL LOW (ref 8.9–10.3)
Chloride: 109 mmol/L (ref 98–111)
Creatinine, Ser: 0.64 mg/dL (ref 0.44–1.00)
GFR, Estimated: >60 mL/min (ref >60)
Glucose, Bld: 83 mg/dL (ref 70–99)
Potassium: 3.9 mmol/L (ref 3.5–5.1)
Sodium: 136 mmol/L (ref 135–145)
Total Bilirubin: 0.4 mg/dL (ref 0.3–1.2)
Total Protein: 7.8 g/dL (ref 6.5–8.1)

## 2021-06-02 LAB — CBC WITH DIFFERENTIAL/PLATELET
Abs Immature Granulocytes: 0.01 10*3/uL (ref 0.00–0.07)
Basophils Absolute: 0 10*3/uL (ref 0.0–0.1)
Basophils Relative: 1 %
Eosinophils Absolute: 0 10*3/uL (ref 0.0–0.5)
Eosinophils Relative: 1 %
HCT: 31.8 % — ABNORMAL LOW (ref 36.0–46.0)
Hemoglobin: 10.8 g/dL — ABNORMAL LOW (ref 12.0–15.0)
Immature Granulocytes: 0 %
Lymphocytes Relative: 37 %
Lymphs Abs: 1.2 10*3/uL (ref 0.7–4.0)
MCH: 29.9 pg (ref 26.0–34.0)
MCHC: 34 g/dL (ref 30.0–36.0)
MCV: 88.1 fL (ref 80.0–100.0)
Monocytes Absolute: 0.3 10*3/uL (ref 0.1–1.0)
Monocytes Relative: 9 %
Neutro Abs: 1.7 10*3/uL (ref 1.7–7.7)
Neutrophils Relative %: 52 %
Platelets: 173 10*3/uL (ref 150–400)
RBC: 3.61 MIL/uL — ABNORMAL LOW (ref 3.87–5.11)
RDW: 13.4 % (ref 11.5–15.5)
WBC: 3.2 10*3/uL — ABNORMAL LOW (ref 4.0–10.5)
nRBC: 0 % (ref 0.0–0.2)

## 2021-06-02 LAB — MAGNESIUM: Magnesium: 1.8 mg/dL (ref 1.7–2.4)

## 2021-06-02 NOTE — Progress Notes (Signed)
PROGRESS NOTE                                                                                                                                                                                                             Patient Demographics:    Audrey [name], is a 40 y.o. female, DOB - [birth year], ZOX:[id number]  Outpatient Primary MD for the patient is Julieanne Manson, MD    LOS - 4  Admit date - 05/28/2021    Chief Complaint  Patient presents with   Numbness       Brief Narrative (HPI from H&P) - Audrey [name] is a 40 y.o. 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 No new focal weakness.    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  Monitor final CSF studies along with B1, EBC, HPV serology.   2.  Lupus.  Currently on Plaquenil has been off of methotrexate for 2 weeks prior to admission, defer this to primary rheumatologist.   3.  History of migraines.  Continue Topamax.  4.  GERD.  On PPI continue.  5.  Prolonged QT at 499 ms.  Resolved after replacing potassium.   6.   Hypokalemia.  Replaced.  7.  Atypical chest pressure on 05/31/2021.  Stable EKG, not tachycardic, no shortness of breath, trial of Maalox 2 doses, stable TTE as well, EF 60% no WMA.       Condition - Fair  Family Communication  :  husband bedside 06/02/21  Code  Status :  Ful  Consults  :  Neuro  PUD Prophylaxis : PPI   Procedures  :     TTE - 1. Left ventricular ejection fraction, by estimation, is 60 to 65%. The left ventricle has normal function. The left ventricle has no regional wall motion abnormalities. Left ventricular diastolic parameters were normal.  2. Right ventricular systolic function is normal. The right ventricular size is normal. Tricuspid regurgitation signal is inadequate for assessing PA pressure.  3. The mitral valve is normal in structure. No evidence of mitral valve regurgitation. No evidence of mitral stenosis.  4. The aortic valve was not well visualized. Aortic valve regurgitation is not visualized. No aortic stenosis is present.  5. The inferior vena cava is normal in size with <50% respiratory variability, suggesting right atrial pressure of 8 mmHg.  MRI Brain and C Spine - non acute  LP - unremarkable CSF        Disposition Plan  :    Dispo: The patient is from: Home              Anticipated d/c is to: Home              Patient currently is not medically stable to d/c.   Difficult to place patient No  DVT Prophylaxis  :    Place TED hose Start: 05/31/21 1140 enoxaparin (LOVENOX) injection 40 mg Start: 05/29/21 1600 SCDs Start: 05/29/21 0041    Lab Results  Component Value Date   PLT 173 06/02/2021    Diet :  Diet Order             Diet Heart Room service appropriate? Yes; Fluid consistency: Thin  Diet effective now                    Inpatient Medications  Scheduled Meds:  enoxaparin (LOVENOX) injection  40 mg Subcutaneous Q24H   hydroxychloroquine  400 mg Oral Daily   pantoprazole  40 mg Oral Daily   sodium chloride flush   3 mL Intravenous Q12H   [START ON 06/06/2021] thiamine injection  100 mg Intravenous Daily   topiramate  75 mg Oral QHS   Continuous Infusions:  Immune Globulin 10% 20 g (06/01/21 2218)   thiamine injection 250 mg (06/01/21 1146)   PRN Meds:.acetaminophen **OR** acetaminophen, ondansetron (ZOFRAN) IV, polyethylene glycol  Antibiotics  :    Anti-infectives (From admission, onward)    Start     Dose/Rate Route Frequency Ordered Stop   05/29/21 0038  hydroxychloroquine (PLAQUENIL) tablet 400 mg       Note to Pharmacy: 2 tabs by mouth once daily     400 mg Oral Daily 05/29/21 0042          Time Spent in minutes  30   Susa Raring M.D on 06/02/2021 at 8:20 AM  To page go to [email or web address]   [facility] -  Office  [phone]    See all Orders from today for further details    Objective:   Vitals:   06/01/21 2230 06/01/21 2252 06/01/21 2315 06/02/21 0338  BP: 114/72 111/71    Pulse:      Resp:      Temp: 98.6 F (37 C) 98 F (36.7 C) 97.7 F (36.5 C) 98.2 F (36.8 C)  TempSrc: Oral Oral Oral Oral  SpO2:      Weight:      Height:        Wt Readings from Last  3 Encounters:  05/30/21 54 kg  05/16/21 54.4 kg  05/15/21 53.5 kg     Intake/Output Summary (Last 24 hours) at 06/02/2021 0820 Last data filed at 06/02/2021 0500 Gross per 24 hour  Intake 53 ml  Output --  Net 53 ml      Physical Exam  Awake Alert, No new F.N deficits but does have generalized weakness worse in L.leg,   [place].AT,PERRAL Supple Neck,No JVD, No cervical lymphadenopathy appriciated.  Symmetrical Chest wall movement, Good air movement bilaterally, CTAB RRR,No Gallops, Rubs or new Murmurs, No Parasternal Heave +ve B.Sounds, Abd Soft, No tenderness, No organomegaly appriciated, No rebound - guarding or rigidity. No Cyanosis, Clubbing or edema, No new Rash or bruise   Data Review:    CBC Recent Labs  Lab 05/28/21 1907 05/29/21 0520 05/30/21 0258 05/31/21 0421  06/01/21 0250 06/02/21 0206  WBC 5.7 3.2* 3.6* 3.1* 3.4* 3.2*  HGB 12.2 11.0* 11.6* 10.4* 11.3* 10.8*  HCT 36.1 31.2* 33.4* 30.6* 33.0* 31.8*  PLT 171 151 177 164 162 173  MCV 88.9 86.9 87.9 87.9 88.7 88.1  MCH 30.0 30.6 30.5 29.9 30.4 29.9  MCHC 33.8 35.3 34.7 34.0 34.2 34.0  RDW 13.2 13.4 13.9 13.5 13.4 13.4  LYMPHSABS 0.9  --  1.6 1.4 1.8 1.2  MONOABS 0.3  --  0.3 0.2 0.3 0.3  EOSABS 0.0  --  0.0 0.0 0.0 0.0  BASOSABS 0.0  --  0.0 0.0 0.0 0.0    Recent Labs  Lab 05/28/21 1907 05/28/21 1925 05/28/21 2016 05/29/21 0105 05/29/21 0316 05/30/21 0258 05/31/21 0421 06/01/21 0250 06/02/21 0206  NA 138  --   --   --  139 137 136 136 136  K 3.8  --   --   --  3.1* 4.4 3.8 3.3* 3.9  CL 112*  --   --   --  112* 111 111 109 109  CO2 18*  --   --   --  18* 22 22 21* 23  GLUCOSE 93  --   --   --  86 81 85 77 83  BUN 1*  --   --   --  <5* <5* <5* <5* <5*  CREATININE 0.58  --   --   --  0.57 0.69 0.65 0.58 0.64  CALCIUM 7.8*  --   --   --  8.0* 8.3* 8.1* 8.4* 8.5*  AST 36  --   --   --   --  52*  ALT 15  --   --   --   --  35  ALKPHOS 50  --   --   --   --  51 46 48 48  BILITOT 0.7  --   --   --   --  0.6 0.6 0.6 0.4  ALBUMIN 3.5  --   --  4.0  --  3.2* 2.8* 3.1* 3.0*  MG  --  1.8  --   --   --  2.1 1.7 1.9 1.8  CRP  --   --  <0.5  --   --   --   --   --   --   TSH  --   --   --   --  1.607  --   --   --   --     ------------------------------------------------------------------------------------------------------------------ No results for input(s): CHOL, HDL, LDLCALC, TRIG, CHOLHDL, LDLDIRECT in the last 72 hours.  Lab  Results  Component Value Date   HGBA1C 5.2 03/04/2021   ------------------------------------------------------------------------------------------------------------------ No results for input(s): TSH, T4TOTAL, T3FREE, THYROIDAB in the last 72 hours.  Invalid input(s): FREET3   Cardiac Enzymes No results for input(s): CKMB, TROPONINI,  MYOGLOBIN in the last 168 hours.  Invalid input(s): CK ------------------------------------------------------------------------------------------------------------------ No results found for: BNP  Micro Results Recent Results (from the past 240 hour(s))  Resp Panel by RT-PCR (Flu A&B, Covid) Nasopharyngeal Swab     Status: None   Collection Time: 05/29/21 12:04 AM   Specimen: Nasopharyngeal Swab; Nasopharyngeal(NP) swabs in vial transport medium  Result Value Ref Range Status   SARS Coronavirus 2 by RT PCR NEGATIVE NEGATIVE Final    Comment: (NOTE) SARS-CoV-2 target nucleic acids are NOT DETECTED.  The SARS-CoV-2 RNA is generally detectable in upper respiratory specimens during the acute phase of infection. The lowest concentration of SARS-CoV-2 viral copies this assay can detect is 138 copies/mL. A negative result does not preclude SARS-Cov-2 infection and should not be used as the sole basis for treatment or other patient management decisions. A negative result may occur with  improper specimen collection/handling, submission of specimen other than nasopharyngeal swab, presence of viral mutation(s) within the areas targeted by this assay, and inadequate number of viral copies(<138 copies/mL). A negative result must be combined with clinical observations, patient history, and epidemiological information. The expected result is Negative.  Fact Sheet for Patients:  BloggerCourse.com  Fact Sheet for Healthcare Providers:  SeriousBroker.it  This test is no t yet approved or cleared by the [place] FDA and  has been authorized for detection and/or diagnosis of SARS-CoV-2 by FDA under an Emergency Use Authorization (EUA). This EUA will remain  in effect (meaning this test can be used) for the duration of the COVID-19 declaration under Section 564(b)(1) of the Act, 21 U.S.C.section 360bbb-3(b)(1), unless the authorization is  terminated  or revoked sooner.       Influenza A by PCR NEGATIVE NEGATIVE Final   Influenza B by PCR NEGATIVE NEGATIVE Final    Comment: (NOTE) The Xpert Xpress SARS-CoV-2/FLU/RSV plus assay is intended as an aid in the diagnosis of influenza from Nasopharyngeal swab specimens and should not be used as a sole basis for treatment. Nasal washings and aspirates are unacceptable for Xpert Xpress SARS-CoV-2/FLU/RSV testing.  Fact Sheet for Patients: BloggerCourse.com  Fact Sheet for Healthcare Providers: SeriousBroker.it  This test is not yet approved or cleared by the [place] FDA and has been authorized for detection and/or diagnosis of SARS-CoV-2 by FDA under an Emergency Use Authorization (EUA). This EUA will remain in effect (meaning this test can be used) for the duration of the COVID-19 declaration under Section 564(b)(1) of the Act, 21 U.S.C. section 360bbb-3(b)(1), unless the authorization is terminated or revoked.  Performed at [facility], [address], [place], [place] [address]   CSF culture w Stat Gram Stain     Status: None   Collection Time: 05/29/21  1:05 AM   Specimen: CSF; Cerebrospinal Fluid  Result Value Ref Range Status   Specimen Description CSF  Final   Special Requests NONE  Final   Gram Stain   Final    WBC PRESENT, PREDOMINANTLY MONONUCLEAR NO ORGANISMS SEEN CYTOSPIN SMEAR    Culture   Final    NO GROWTH 3 DAYS Performed at [facility], [address] [place], [place] [address]    Report Status 06/01/2021 FINAL  Final    Radiology Reports DG  Abd 1 View  Result Date: 06/01/2021 CLINICAL DATA:  Nausea and vomiting. EXAM: ABDOMEN - 1 VIEW COMPARISON:  None. FINDINGS: Nonobstructive bowel gas pattern. Multiple calcifications within the RIGHT upper abdomen, presumably renal stones, largest measuring approximately 6 mm. Cholecystectomy clips in the RIGHT upper quadrant. IUD  appropriately positioned in the midline pelvis. No evidence of abnormal fluid collection or free intraperitoneal air. Visualized osseous structures are unremarkable. IMPRESSION: 1. Nonobstructive bowel gas pattern. 2. Multiple calcifications within the RIGHT upper abdomen, presumably renal stones, largest measuring approximately 6 mm. Electronically Signed   By: Bary RichardStan  Maynard M.D.   On: 06/01/2021 09:01   MR Brain W and Wo Contrast  Result Date: 05/28/2021 CLINICAL DATA:  Left facial numbness EXAM: MRI HEAD WITHOUT AND WITH CONTRAST TECHNIQUE: Multiplanar, multiecho pulse sequences of the brain and surrounding structures were obtained without and with intravenous contrast. CONTRAST:  5mL GADAVIST GADOBUTROL 1 MMOL/ML IV SOLN COMPARISON:  None. FINDINGS: Brain: No acute infarct, mass effect or extra-axial collection. No acute or chronic hemorrhage. Normal white matter signal, parenchymal volume and CSF spaces. The midline structures are normal. Vascular: Major flow voids are preserved. Skull and upper cervical spine: Normal calvarium and skull base. Visualized upper cervical spine and soft tissues are normal. Sinuses/Orbits:No paranasal sinus fluid levels or advanced mucosal thickening. No mastoid or middle ear effusion. Normal orbits. IMPRESSION: Normal brain MRI. Electronically Signed   By: Deatra RobinsonKevin  Herman M.D.   On: 05/28/2021 23:06   MR Cervical Spine W or Wo Contrast  Result Date: 05/28/2021 CLINICAL DATA:  Left facial numbness and hand contracting. Migraine. EXAM: MRI CERVICAL SPINE WITHOUT AND WITH CONTRAST TECHNIQUE: Multiplanar and multiecho pulse sequences of the cervical spine, to include the craniocervical junction and cervicothoracic junction, were obtained without and with intravenous contrast. CONTRAST:  5mL GADAVIST GADOBUTROL 1 MMOL/ML IV SOLN COMPARISON:  None. FINDINGS: Alignment: Physiologic. Vertebrae: No fracture, evidence of discitis, or bone lesion. Cord: Normal signal and morphology.  Posterior Fossa, vertebral arteries, paraspinal tissues: Negative. Disc levels: No spinal canal or neural foraminal stenosis. IMPRESSION: Normal MRI of the cervical spine. Electronically Signed   By: Deatra RobinsonKevin  Herman M.D.   On: 05/28/2021 23:13   MR Venogram Head  Result Date: 05/31/2021 CLINICAL DATA:  Dural venous sinus thrombosis suspected.  Headache. EXAM: MRI head venogram. TECHNIQUE: Multiplanar, multi-echo pulse sequences of the brain and surrounding structures were acquired without intravenous contrast. Images of the dural venous sinuses were acquired using MRV technique without intravenous contrast. COMPARISON:  MRI head May 28, 2021. FINDINGS: No evidence of dural venous sinus thrombosis. Patent superior sagittal sinus, transverse and sigmoid sinuses, jugular bulbs, and straight sinus. The visualized deep cerebral veins and cortical draining veins are patent. Discrete, rounded filling defects within the superior sagittal sinus and distal left transverse sinus are compatible with arachnoid granulations. IMPRESSION: No evidence of dural venous sinus thrombosis. Electronically Signed   By: Feliberto HartsFrederick S Jones MD   On: 05/31/2021 17:34   MR FEMUR LEFT WO CONTRAST  Result Date: 05/30/2021 CLINICAL DATA:  Left greater than right leg weakness and numbness. 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 EXAM: MR OF THE LEFT FEMUR WITHOUT CONTRAST TECHNIQUE: Multiplanar, multisequence MR imaging of the left femur was performed. No intravenous contrast was administered. COMPARISON:  None. FINDINGS: Bones/Joint/Cartilage No marrow signal abnormality. No fracture or dislocation. Joint spaces are preserved. Trace bilateral knee joint effusions. Ligaments The bilateral hip labra are grossly intact. The bilateral knee ligaments and menisci are grossly intact.  Muscles and Tendons Intact.  No muscle edema or atrophy. Soft tissue No fluid collection or hematoma. No soft tissue mass. The visualized pelvic  structures are unremarkable. IMPRESSION: 1. Normal MRI of the left thigh. No evidence of myositis. Electronically Signed   By: Obie Dredge M.D.   On: 05/30/2021 08:01   DG Chest Port 1 View  Result Date: 05/31/2021 CLINICAL DATA:  Shortness of breath. EXAM: PORTABLE CHEST 1 VIEW COMPARISON:  05/28/2021 FINDINGS: Both lungs are clear. Negative for a pneumothorax. Heart and mediastinum are within normal limits. Trachea is midline. No acute bone abnormality. Cholecystectomy clips. IMPRESSION: No active disease. Electronically Signed   By: Richarda Overlie M.D.   On: 05/31/2021 10:52   DG Chest Port 1 View  Result Date: 05/28/2021 CLINICAL DATA:  Shortness of breath, left arm numbness and tingling. EXAM: PORTABLE CHEST 1 VIEW COMPARISON:  03/19/2021 FINDINGS: The heart size and mediastinal contours are within normal limits. Both lungs are clear. The visualized skeletal structures are unremarkable. IMPRESSION: No active disease. Electronically Signed   By: Burman Nieves M.D.   On: 05/28/2021 19:45   ECHOCARDIOGRAM COMPLETE  Result Date: 05/31/2021    ECHOCARDIOGRAM REPORT   Patient Name:   Audrey [name] Date of Exam: 05/31/2021 Medical Rec #:  [id number]              Height:       59.5 in Accession #:    [id number]             Weight:       119.0 lb Date of Birth:  February 02, 1981              BSA:          1.488 m Patient Age:    39 years               BP:           116/66 mmHg Patient Gender: F                      HR:           70 bpm. Exam Location:  Inpatient Procedure: 2D Echo Indications:   acute diastolic chf  History:       Patient has no prior history of Echocardiogram examinations.                Lupus.  Sonographer:   Delcie Roch Referring      [address] [facility] Phys: IMPRESSIONS  1. Left ventricular ejection fraction, by estimation, is 60 to 65%. The left ventricle has normal function. The left ventricle has no regional wall motion abnormalities. Left ventricular diastolic parameters  were normal.  2. Right ventricular systolic function is normal. The right ventricular size is normal. Tricuspid regurgitation signal is inadequate for assessing PA pressure.  3. The mitral valve is normal in structure. No evidence of mitral valve regurgitation. No evidence of mitral stenosis.  4. The aortic valve was not well visualized. Aortic valve regurgitation is not visualized. No aortic stenosis is present.  5. The inferior vena cava is normal in size with <50% respiratory variability, suggesting right atrial pressure of 8 mmHg. FINDINGS  Left Ventricle: Left ventricular ejection fraction, by estimation, is 60 to 65%. The left ventricle has normal function. The left ventricle has no regional wall motion abnormalities. The left ventricular internal cavity size was normal in size. There is  no left ventricular hypertrophy. Left ventricular  diastolic parameters were normal. Right Ventricle: The right ventricular size is normal. No increase in right ventricular wall thickness. Right ventricular systolic function is normal. Tricuspid regurgitation signal is inadequate for assessing PA pressure. Left Atrium: Left atrial size was normal in size. Right Atrium: Right atrial size was normal in size. Pericardium: There is no evidence of pericardial effusion. Mitral Valve: The mitral valve is normal in structure. No evidence of mitral valve regurgitation. No evidence of mitral valve stenosis. Tricuspid Valve: The tricuspid valve is normal in structure. Tricuspid valve regurgitation is not demonstrated. Aortic Valve: The aortic valve was not well visualized. Aortic valve regurgitation is not visualized. No aortic stenosis is present. Pulmonic Valve: The pulmonic valve was not well visualized. Pulmonic valve regurgitation is not visualized. Aorta: The aortic root is normal in size and structure. Venous: The inferior vena cava is normal in size with less than 50% respiratory variability, suggesting right atrial pressure of 8  mmHg. IAS/Shunts: The interatrial septum was not well visualized.  LEFT VENTRICLE PLAX 2D LVIDd:         4.20 cm  Diastology LVIDs:         2.50 cm  LV e' medial:    15.70 cm/s LV PW:         0.70 cm  LV E/e' medial:  5.7 LV IVS:        0.70 cm  LV e' lateral:   23.50 cm/s LVOT diam:     1.80 cm  LV E/e' lateral: 3.8 LV SV:         54 LV SV Index:   37 LVOT Area:     2.54 cm  RIGHT VENTRICLE             IVC RV S prime:     16.20 cm/s  IVC diam: 1.60 cm TAPSE (M-mode): 2.3 cm LEFT ATRIUM             Index       RIGHT ATRIUM          Index LA diam:        2.60 cm 1.75 cm/m  RA Area:     9.52 cm LA Vol (A2C):   24.9 ml 16.73 ml/m RA Volume:   19.60 ml 13.17 ml/m LA Vol (A4C):   22.1 ml 14.85 ml/m LA Biplane Vol: 24.5 ml 16.47 ml/m  AORTIC VALVE LVOT Vmax:   113.00 cm/s LVOT Vmean:  70.200 cm/s LVOT VTI:    0.214 m  AORTA Ao Root diam: 2.50 cm MITRAL VALVE MV Area (PHT): 3.65 cm    SHUNTS MV Decel Time: 208 msec    Systemic VTI:  0.21 m MV E velocity: 89.60 cm/s  Systemic Diam: 1.80 cm MV A velocity: 86.50 cm/s MV E/A ratio:  1.04 Epifanio Lesches MD Electronically signed by Epifanio Lesches MD Signature Date/Time: 05/31/2021/6:46:15 PM    Final

## 2021-06-03 ENCOUNTER — Telehealth: Payer: Self-pay | Admitting: Clinical

## 2021-06-03 ENCOUNTER — Encounter: Payer: Self-pay | Admitting: Neurology

## 2021-06-03 LAB — MAGNESIUM: Magnesium: 1.9 mg/dL (ref 1.7–2.4)

## 2021-06-03 LAB — CBC WITH DIFFERENTIAL/PLATELET
Abs Immature Granulocytes: 0.01 10*3/uL (ref 0.00–0.07)
Basophils Absolute: 0 10*3/uL (ref 0.0–0.1)
Basophils Relative: 1 %
Eosinophils Absolute: 0 10*3/uL (ref 0.0–0.5)
Eosinophils Relative: 1 %
HCT: 32.1 % — ABNORMAL LOW (ref 36.0–46.0)
Hemoglobin: 11.1 g/dL — ABNORMAL LOW (ref 12.0–15.0)
Immature Granulocytes: 0 %
Lymphocytes Relative: 35 %
Lymphs Abs: 1.1 10*3/uL (ref 0.7–4.0)
MCH: 30.2 pg (ref 26.0–34.0)
MCHC: 34.6 g/dL (ref 30.0–36.0)
MCV: 87.2 fL (ref 80.0–100.0)
Monocytes Absolute: 0.3 10*3/uL (ref 0.1–1.0)
Monocytes Relative: 8 %
Neutro Abs: 1.7 10*3/uL (ref 1.7–7.7)
Neutrophils Relative %: 55 %
Platelets: 172 10*3/uL (ref 150–400)
RBC: 3.68 MIL/uL — ABNORMAL LOW (ref 3.87–5.11)
RDW: 13.4 % (ref 11.5–15.5)
WBC: 3.1 10*3/uL — ABNORMAL LOW (ref 4.0–10.5)
nRBC: 0 % (ref 0.0–0.2)

## 2021-06-03 LAB — COMPREHENSIVE METABOLIC PANEL
ALT: 58 U/L — ABNORMAL HIGH (ref 0–44)
AST: 78 U/L — ABNORMAL HIGH (ref 15–41)
Albumin: 2.7 g/dL — ABNORMAL LOW (ref 3.5–5.0)
Alkaline Phosphatase: 52 U/L (ref 38–126)
Anion gap: 5 (ref 5–15)
BUN: <5 mg/dL — ABNORMAL LOW (ref 6–20)
CO2: 19 mmol/L — ABNORMAL LOW (ref 22–32)
Calcium: 8.3 mg/dL — ABNORMAL LOW (ref 8.9–10.3)
Chloride: 111 mmol/L (ref 98–111)
Creatinine, Ser: 0.61 mg/dL (ref 0.44–1.00)
GFR, Estimated: >60 mL/min (ref >60)
Glucose, Bld: 81 mg/dL (ref 70–99)
Potassium: 3.6 mmol/L (ref 3.5–5.1)
Sodium: 135 mmol/L (ref 135–145)
Total Bilirubin: 0.4 mg/dL (ref 0.3–1.2)
Total Protein: 7.4 g/dL (ref 6.5–8.1)

## 2021-06-03 LAB — HUMAN PARVOVIRUS DNA DETECTION BY PCR: Parvovirus B19, PCR: NEGATIVE

## 2021-06-03 LAB — CMV DNA BY PCR, QUALITATIVE: CMV DNA, Qual PCR: NEGATIVE

## 2021-06-03 MED ORDER — ACETAMINOPHEN 500 MG PO TABS: 500.0000 mg | ORAL_TABLET | Freq: Three times a day (TID) | ORAL | 0 refills | Status: AC | PRN

## 2021-06-03 MED ORDER — IBUPROFEN 400 MG PO TABS: 400.0000 mg | ORAL_TABLET | Freq: Three times a day (TID) | ORAL | 0 refills | Status: AC | PRN

## 2021-06-03 NOTE — Progress Notes (Signed)
Occupational Therapy Treatment Patient Details Name: Audrey [name] MRN: [id number] DOB: 05-Aug-1981 Today's Date: 06/03/2021    History of present illness Pt is 40 yo female admitted on 05/28/21 with Bil hand weakness and contractures, LE weakness and numbness, and headache.  MRI of brain and C-spine unremarkable.  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 History includes lupus, GERD, arthritis, migraines, and anemia.   OT comments  Pt seen with interpreter, Graciella. BUE strength Has improved slightly however LUE remains weaker than R. Educated pt/husband on energy conservation and strategies to reduce risk of falls. Educated on BUE strengthening, however unable to locate her written HEP previously provided. Pt voiced concerns about not understanding what is "going on with her body" and not "knowing her diagnosis". She and her husband would like to speak with the MD using an interpreter.  Will continue to follow.    Follow Up Recommendations  Outpatient OT    Equipment Recommendations  Tub/shower seat    Recommendations for Other Services      Precautions / Restrictions Precautions Precautions: Fall       Mobility Bed Mobility                    Transfers                      Balance                                           ADL either performed or assessed with clinical judgement   ADL                                         General ADL Comments: Educated pt/husband on energy conservation and safety during ADL. Recommend use of showerseat/3in1 as shower seat and to complete ADL tasks primarily in seated position. Has grab bar in shower. REcommend use of hand held shower. Pt feels more stable using RW with mobility     Vision       Perception     Praxis      Cognition Arousal/Alertness: Awake/alert Behavior During Therapy: WFL for tasks  assessed/performed Overall Cognitive Status: Within Functional Limits for tasks assessed                                 General Comments: slow processing        Exercises Exercises: Other exercises;General Upper Extremity General Exercises - Upper Extremity Shoulder Flexion: AROM;Strengthening;Both;10 reps;Seated;Theraband Theraband Level (Shoulder Flexion): Level 1 (Yellow) Shoulder Extension: Strengthening;Both;10 reps;Seated;Theraband Theraband Level (Shoulder Extension): Level 1 (Yellow) Elbow Flexion: Strengthening;Both;10 reps;Seated;Theraband Theraband Level (Elbow Flexion): Level 1 (Yellow) Elbow Extension: Strengthening;Both;10 reps;Seated;Theraband Theraband Level (Elbow Extension): Level 1 (Yellow) Other Exercises Other Exercises: theraputty exercises - level 1 for grip adn pinch strengthening   Shoulder Instructions       General Comments Educated pt on use of gaze stabilization during mobility to help reduce feelings of being dizzy.    Pertinent Vitals/ Pain       Pain Assessment: Faces Pain Score: 2  Pain Location: headache Pain Descriptors / Indicators: Headache Pain Intervention(s): Limited activity within patient's tolerance  Home Living                                          Prior Functioning/Environment              Frequency  Min 2X/week        Progress Toward Goals  OT Goals(current goals can now be found in the care plan section)  Progress towards OT goals: Progressing toward goals  Acute Rehab OT Goals Patient Stated Goal: get stronger; return home OT Goal Formulation: With patient Time For Goal Achievement: 06/13/21 Potential to Achieve Goals: Good ADL Goals Pt Will Perform Lower Body Dressing: with modified independence;sit to/from stand Pt Will Transfer to Toilet: with modified independence;ambulating Pt Will Perform Tub/Shower Transfer: Shower transfer;ambulating;with modified  independence Additional ADL Goal #1: Pt will independently verbalize 3 energy conservation strateiges Additional ADL Goal #2: Pt will verbalize 2 strateiges to reduce risk of falls  Plan Discharge plan remains appropriate    Co-evaluation                 AM-PAC OT "6 Clicks" Daily Activity     Outcome Measure   Help from another person eating meals?: None Help from another person taking care of personal grooming?: A Little Help from another person toileting, which includes using toliet, bedpan, or urinal?: A Little Help from another person bathing (including washing, rinsing, drying)?: A Little Help from another person to put on and taking off regular upper body clothing?: A Little Help from another person to put on and taking off regular lower body clothing?: A Little 6 Click Score: 19    End of Session    OT Visit Diagnosis: Unsteadiness on feet (R26.81);Muscle weakness (generalized) (M62.81);Other abnormalities of gait and mobility (R26.89);Pain;Dizziness and giddiness (R42);Low vision, both eyes (H54.2) Pain - part of body:  (Head)   Activity Tolerance Patient tolerated treatment well   Patient Left in chair;with call bell/phone within reach;with family/visitor present   Nurse Communication Mobility status;Other (comment) (DC needs)        Time: 0930-1000 OT Time Calculation (min): 30 min  Charges: OT General Charges $OT Visit: 1 Visit OT Treatments $Self Care/Home Management : 8-22 mins $Therapeutic Exercise: 8-22 mins  Luisa Dago, OT/L   Acute OT Clinical Specialist Acute Rehabilitation Services Pager [phone] Office [phone]    [facility] 06/03/2021, 10:30 AM

## 2021-06-03 NOTE — Discharge Summary (Signed)
Audrey [name] ZOX:[id number] DOB: 02/28/1981 DOA: 05/28/2021  PCP: Julieanne MansonMulberry, Elizabeth, MD  Admit date: 05/28/2021  Discharge date: 06/03/2021  Admitted From: Home  Disposition:  Home   Recommendations for Outpatient Follow-up:   Follow up with PCP in 1-2 weeks  PCP Please obtain BMP/CBC, 2 view CXR in 1week,  (see Discharge instructions)   PCP Please follow up on the following pending results: Needs close follow-up and monitoring by neurology in the outpatient setting for CDI P along with close follow-up with her rheumatologist   Home Health: outpt PT Equipment/Devices: walker, 3in1  Consultations: Neuro Discharge Condition: Stable    CODE STATUS: Full    Diet Recommendation: Heart Healthy   Diet Order             Diet - low sodium heart healthy           Diet Heart Room service appropriate? Yes; Fluid consistency: Thin  Diet effective now                    Chief Complaint  Patient presents with   Numbness     Brief history of present illness from the day of admission and additional interim summary    Audrey [name] is a 40 y.o. 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   2.  Lupus.  Currently on Plaquenil has been off of methotrexate for 2 weeks prior to admission, defer this to primary rheumatologist.   3.  History of migraines.  Continue Topamax.   4.  GERD.  On PPI continue.   5.  Prolonged QT at 499 ms.  Resolved after replacing potassium.   6.  Hypokalemia.  Replaced and stable.  7.  Atypical chest pressure on 05/31/2021.  Stable EKG, not tachycardic, no shortness of breath, trial of Maalox 2 doses, stable TTE as well, EF 60% no WMA.  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)   Intractable episodic headache   Weakness    Discharge instructions    Discharge Instructions     Diet - low sodium heart healthy   Complete by: As directed    Discharge instructions   Complete by: As directed    Follow with Primary MD Julieanne Manson, MD in 7 days   Get CBC, CMP checked next visit within 1 week by Primary MD   Activity: As tolerated with Full fall precautions use walker/cane & assistance as needed  Disposition Home    Diet: Heart Healthy    Special Instructions: If you have smoked or chewed Tobacco  in the last 2 yrs please stop smoking, stop any regular Alcohol  and or any Recreational drug use.  On your next visit with your primary care physician please Get Medicines reviewed and adjusted.  Please request your Prim.MD to go over all Hospital Tests and Procedure/Radiological results at the follow  up, please get all Hospital records sent to your Prim MD by signing hospital release before you go home.  If you experience worsening of your admission symptoms, develop shortness of breath, life threatening emergency, suicidal or homicidal thoughts you must seek medical attention immediately by calling 911 or calling your MD immediately  if symptoms less severe.  You Must read complete instructions/literature along with all the possible adverse reactions/side effects for all the Medicines you take and that have been prescribed to you. Take any new Medicines after you have completely understood and accpet all the possible adverse reactions/side effects.   Increase activity slowly   Complete by: As directed        Discharge Medications   Allergies as of 06/03/2021   No Known Allergies      Medication List     TAKE these medications    acetaminophen 500 MG tablet Commonly known as: TYLENOL Take 1 tablet (500 mg total) by mouth every 8 (eight) hours as needed for moderate pain.   calcium-vitamin D 500-200 MG-UNIT tablet Commonly known as: OSCAL WITH D Take 2 tablets by mouth daily with breakfast.   ferrous gluconate 324 MG tablet Commonly known as: FERGON Take 1 tablet (324 mg total) by mouth 2 (two) times daily with a meal.   folic acid 1 MG tablet Commonly known as: FOLVITE Take 1 mg by mouth daily.   hydroxychloroquine 200 MG tablet Commonly known as: PLAQUENIL 2 tabs by mouth once daily What changed:  how much to take how to take this when to take this   ibuprofen 400 MG tablet Commonly known as: ADVIL Take 1 tablet (400 mg total) by mouth every 8 (eight) hours as needed for fever, headache or moderate pain. What changed:  medication strength when to take this reasons to take this   levonorgestrel 20 MCG/24HR IUD Commonly known as: MIRENA 1 each by Intrauterine route once.   omeprazole 40 MG capsule Commonly known as: PRILOSEC 1 cap by mouth 1 hour before  breakfast daily What changed:  how much to take how to take this when to take this   topiramate 25 MG tablet Commonly known as: Topamax 3 tabs by mouth at bedtime  What changed:  how much to take how to take this when to take this       ASK your doctor about these medications    methotrexate 2.5 MG tablet Commonly known as: RHEUMATREX Take 2.5 mg by mouth once a week. Take 8 tablets by mouth once a week               Durable Medical Equipment  (From admission, onward)           Start     Ordered   06/02/21 0825  For home use only DME Walker rolling  Once       Comments: 5 wheel  Question Answer Comment  Walker: With 5 Inch Wheels   Patient needs a walker to treat with the following condition Weakness      06/02/21 0824             Follow-up Information     Julieanne Manson, MD. Schedule an appointment as soon as possible for a visit in 1 week(s).   Specialty: Internal Medicine Contact information: [address] [place] [address] [phone]         [facility]. Schedule an appointment as soon as possible for a visit in 1 week(s).   Why: CDIP Contact information: [address]     [address] [place] [address] [phone]                Major procedures and Radiology Reports - PLEASE review detailed and final reports thoroughly  -         DG Abd 1 View  Result Date: 06/01/2021 CLINICAL DATA:  Nausea and vomiting. EXAM: ABDOMEN - 1 VIEW COMPARISON:  None. FINDINGS: Nonobstructive bowel gas pattern. Multiple calcifications within the RIGHT upper abdomen, presumably renal stones, largest measuring approximately 6 mm. Cholecystectomy clips in the RIGHT upper quadrant. IUD appropriately positioned in the midline pelvis. No evidence of abnormal fluid collection or free intraperitoneal air. Visualized osseous structures are unremarkable. IMPRESSION: 1. Nonobstructive bowel gas pattern. 2.  Multiple calcifications within the RIGHT upper abdomen, presumably renal stones, largest measuring approximately 6 mm. Electronically Signed   By: Bary Richard M.D.   On: 06/01/2021 09:01   MR Brain W and Wo Contrast  Result Date: 05/28/2021 CLINICAL DATA:  Left facial numbness EXAM: MRI HEAD WITHOUT AND WITH CONTRAST TECHNIQUE: Multiplanar, multiecho pulse sequences of the brain and surrounding structures were obtained without and with intravenous contrast. CONTRAST:  5mL GADAVIST GADOBUTROL 1 MMOL/ML IV SOLN COMPARISON:  None. FINDINGS: Brain: No acute infarct, mass effect or extra-axial collection. No acute or chronic hemorrhage. Normal white matter signal, parenchymal volume and CSF spaces. The midline structures are normal. Vascular: Major flow voids are preserved. Skull and upper cervical spine: Normal calvarium and skull base. Visualized upper cervical spine and soft tissues are normal. Sinuses/Orbits:No paranasal sinus fluid levels or advanced mucosal thickening. No mastoid or middle ear effusion. Normal orbits. IMPRESSION: Normal brain MRI. Electronically Signed   By: Deatra Robinson M.D.   On: 05/28/2021 23:06   MR Cervical Spine W or Wo Contrast  Result Date: 05/28/2021 CLINICAL DATA:  Left facial numbness and hand contracting. Migraine. EXAM: MRI CERVICAL SPINE WITHOUT AND WITH CONTRAST TECHNIQUE: Multiplanar and multiecho pulse sequences of the cervical spine, to include the craniocervical junction and cervicothoracic junction, were obtained without and with intravenous contrast. CONTRAST:  5mL GADAVIST GADOBUTROL 1 MMOL/ML IV SOLN COMPARISON:  None. FINDINGS: Alignment: Physiologic. Vertebrae: No  fracture, evidence of discitis, or bone lesion. Cord: Normal signal and morphology. Posterior Fossa, vertebral arteries, paraspinal tissues: Negative. Disc levels: No spinal canal or neural foraminal stenosis. IMPRESSION: Normal MRI of the cervical spine. Electronically Signed   By: Deatra Robinson M.D.    On: 05/28/2021 23:13   MR Venogram Head  Result Date: 05/31/2021 CLINICAL DATA:  Dural venous sinus thrombosis suspected.  Headache. EXAM: MRI head venogram. TECHNIQUE: Multiplanar, multi-echo pulse sequences of the brain and surrounding structures were acquired without intravenous contrast. Images of the dural venous sinuses were acquired using MRV technique without intravenous contrast. COMPARISON:  MRI head May 28, 2021. FINDINGS: No evidence of dural venous sinus thrombosis. Patent superior sagittal sinus, transverse and sigmoid sinuses, jugular bulbs, and straight sinus. The visualized deep cerebral veins and cortical draining veins are patent. Discrete, rounded filling defects within the superior sagittal sinus and distal left transverse sinus are compatible with arachnoid granulations. IMPRESSION: No evidence of dural venous sinus thrombosis. Electronically Signed   By: Feliberto Harts MD   On: 05/31/2021 17:34   MR FEMUR LEFT WO CONTRAST  Result Date: 05/30/2021 CLINICAL DATA:  Left greater than right leg weakness and numbness. 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 EXAM: MR OF THE LEFT FEMUR WITHOUT CONTRAST TECHNIQUE: Multiplanar, multisequence MR imaging of the left femur was performed. No intravenous contrast was administered. COMPARISON:  None. FINDINGS: Bones/Joint/Cartilage No marrow signal abnormality. No fracture or dislocation. Joint spaces are preserved. Trace bilateral knee joint effusions. Ligaments The bilateral hip labra are grossly intact. The bilateral knee ligaments and menisci are grossly intact. Muscles and Tendons Intact.  No muscle edema or atrophy. Soft tissue No fluid collection or hematoma. No soft tissue mass. The visualized pelvic structures are unremarkable. IMPRESSION: 1. Normal MRI of the left thigh. No evidence of myositis. Electronically Signed   By: Obie Dredge M.D.   On: 05/30/2021 08:01   DG Chest Port 1 View  Result Date:  05/31/2021 CLINICAL DATA:  Shortness of breath. EXAM: PORTABLE CHEST 1 VIEW COMPARISON:  05/28/2021 FINDINGS: Both lungs are clear. Negative for a pneumothorax. Heart and mediastinum are within normal limits. Trachea is midline. No acute bone abnormality. Cholecystectomy clips. IMPRESSION: No active disease. Electronically Signed   By: Richarda Overlie M.D.   On: 05/31/2021 10:52   DG Chest Port 1 View  Result Date: 05/28/2021 CLINICAL DATA:  Shortness of breath, left arm numbness and tingling. EXAM: PORTABLE CHEST 1 VIEW COMPARISON:  03/19/2021 FINDINGS: The heart size and mediastinal contours are within normal limits. Both lungs are clear. The visualized skeletal structures are unremarkable. IMPRESSION: No active disease. Electronically Signed   By: Burman Nieves M.D.   On: 05/28/2021 19:45   ECHOCARDIOGRAM COMPLETE  Result Date: 05/31/2021    ECHOCARDIOGRAM REPORT   Patient Name:   Audrey [name] Date of Exam: 05/31/2021 Medical Rec #:  [id number]              Height:       59.5 in Accession #:    [id number]             Weight:       119.0 lb Date of Birth:  1981/03/06              BSA:          1.488 m Patient Age:    39 years               BP:  116/66 mmHg Patient Gender: F                      HR:           70 bpm. Exam Location:  Inpatient Procedure: 2D Echo Indications:   acute diastolic chf  History:       Patient has no prior history of Echocardiogram examinations.                Lupus.  Sonographer:   Delcie Roch Referring      [address] [facility] Phys: IMPRESSIONS  1. Left ventricular ejection fraction, by estimation, is 60 to 65%. The left ventricle has normal function. The left ventricle has no regional wall motion abnormalities. Left ventricular diastolic parameters were normal.  2. Right ventricular systolic function is normal. The right ventricular size is normal. Tricuspid regurgitation signal is inadequate for assessing PA pressure.  3. The mitral valve is normal in  structure. No evidence of mitral valve regurgitation. No evidence of mitral stenosis.  4. The aortic valve was not well visualized. Aortic valve regurgitation is not visualized. No aortic stenosis is present.  5. The inferior vena cava is normal in size with <50% respiratory variability, suggesting right atrial pressure of 8 mmHg. FINDINGS  Left Ventricle: Left ventricular ejection fraction, by estimation, is 60 to 65%. The left ventricle has normal function. The left ventricle has no regional wall motion abnormalities. The left ventricular internal cavity size was normal in size. There is  no left ventricular hypertrophy. Left ventricular diastolic parameters were normal. Right Ventricle: The right ventricular size is normal. No increase in right ventricular wall thickness. Right ventricular systolic function is normal. Tricuspid regurgitation signal is inadequate for assessing PA pressure. Left Atrium: Left atrial size was normal in size. Right Atrium: Right atrial size was normal in size. Pericardium: There is no evidence of pericardial effusion. Mitral Valve: The mitral valve is normal in structure. No evidence of mitral valve regurgitation. No evidence of mitral valve stenosis. Tricuspid Valve: The tricuspid valve is normal in structure. Tricuspid valve regurgitation is not demonstrated. Aortic Valve: The aortic valve was not well visualized. Aortic valve regurgitation is not visualized. No aortic stenosis is present. Pulmonic Valve: The pulmonic valve was not well visualized. Pulmonic valve regurgitation is not visualized. Aorta: The aortic root is normal in size and structure. Venous: The inferior vena cava is normal in size with less than 50% respiratory variability, suggesting right atrial pressure of 8 mmHg. IAS/Shunts: The interatrial septum was not well visualized.  LEFT VENTRICLE PLAX 2D LVIDd:         4.20 cm  Diastology LVIDs:         2.50 cm  LV e' medial:    15.70 cm/s LV PW:         0.70 cm  LV  E/e' medial:  5.7 LV IVS:        0.70 cm  LV e' lateral:   23.50 cm/s LVOT diam:     1.80 cm  LV E/e' lateral: 3.8 LV SV:         54 LV SV Index:   37 LVOT Area:     2.54 cm  RIGHT VENTRICLE             IVC RV S prime:     16.20 cm/s  IVC diam: 1.60 cm TAPSE (M-mode): 2.3 cm LEFT ATRIUM  Index       RIGHT ATRIUM          Index LA diam:        2.60 cm 1.75 cm/m  RA Area:     9.52 cm LA Vol (A2C):   24.9 ml 16.73 ml/m RA Volume:   19.60 ml 13.17 ml/m LA Vol (A4C):   22.1 ml 14.85 ml/m LA Biplane Vol: 24.5 ml 16.47 ml/m  AORTIC VALVE LVOT Vmax:   113.00 cm/s LVOT Vmean:  70.200 cm/s LVOT VTI:    0.214 m  AORTA Ao Root diam: 2.50 cm MITRAL VALVE MV Area (PHT): 3.65 cm    SHUNTS MV Decel Time: 208 msec    Systemic VTI:  0.21 m MV E velocity: 89.60 cm/s  Systemic Diam: 1.80 cm MV A velocity: 86.50 cm/s MV E/A ratio:  1.04 Epifanio Lesches MD Electronically signed by Epifanio Lesches MD Signature Date/Time: 05/31/2021/6:46:15 PM    Final     Micro Results     Recent Results (from the past 240 hour(s))  Resp Panel by RT-PCR (Flu A&B, Covid) Nasopharyngeal Swab     Status: None   Collection Time: 05/29/21 12:04 AM   Specimen: Nasopharyngeal Swab; Nasopharyngeal(NP) swabs in vial transport medium  Result Value Ref Range Status   SARS Coronavirus 2 by RT PCR NEGATIVE NEGATIVE Final    Comment: (NOTE) SARS-CoV-2 target nucleic acids are NOT DETECTED.  The SARS-CoV-2 RNA is generally detectable in upper respiratory specimens during the acute phase of infection. The lowest concentration of SARS-CoV-2 viral copies this assay can detect is 138 copies/mL. A negative result does not preclude SARS-Cov-2 infection and should not be used as the sole basis for treatment or other patient management decisions. A negative result may occur with  improper specimen collection/handling, submission of specimen other than nasopharyngeal swab, presence of viral mutation(s) within the areas targeted by  this assay, and inadequate number of viral copies(<138 copies/mL). A negative result must be combined with clinical observations, patient history, and epidemiological information. The expected result is Negative.  Fact Sheet for Patients:  BloggerCourse.com  Fact Sheet for Healthcare Providers:  SeriousBroker.it  This test is no t yet approved or cleared by the [place] FDA and  has been authorized for detection and/or diagnosis of SARS-CoV-2 by FDA under an Emergency Use Authorization (EUA). This EUA will remain  in effect (meaning this test can be used) for the duration of the COVID-19 declaration under Section 564(b)(1) of the Act, 21 U.S.C.section 360bbb-3(b)(1), unless the authorization is terminated  or revoked sooner.       Influenza A by PCR NEGATIVE NEGATIVE Final   Influenza B by PCR NEGATIVE NEGATIVE Final    Comment: (NOTE) The Xpert Xpress SARS-CoV-2/FLU/RSV plus assay is intended as an aid in the diagnosis of influenza from Nasopharyngeal swab specimens and should not be used as a sole basis for treatment. Nasal washings and aspirates are unacceptable for Xpert Xpress SARS-CoV-2/FLU/RSV testing.  Fact Sheet for Patients: BloggerCourse.com  Fact Sheet for Healthcare Providers: SeriousBroker.it  This test is not yet approved or cleared by the [place] FDA and has been authorized for detection and/or diagnosis of SARS-CoV-2 by FDA under an Emergency Use Authorization (EUA). This EUA will remain in effect (meaning this test can be used) for the duration of the COVID-19 declaration under Section 564(b)(1) of the Act, 21 U.S.C. section 360bbb-3(b)(1), unless the authorization is terminated or revoked.  Performed at [facility], [address] [place], [place] [address]   CSF culture w Stat Gram Stain     Status: None   Collection Time: 05/29/21   1:05 AM   Specimen: CSF; Cerebrospinal Fluid  Result Value Ref Range Status   Specimen Description CSF  Final   Special Requests NONE  Final   Gram Stain   Final    WBC PRESENT, PREDOMINANTLY MONONUCLEAR NO ORGANISMS SEEN CYTOSPIN SMEAR    Culture   Final    NO GROWTH 3 DAYS Performed at [facility], [address] [place], [place] [address]    Report Status 06/01/2021 FINAL  Final    Today   Subjective    Audrey [name] today has mild generalized headache,no chest abdominal pain,no new weakness tingling or numbness, feels much better wants to go home today.     Objective   Blood pressure 112/70, pulse 87, temperature 97.9 F (36.6 C), temperature source Oral, resp. rate 20, height 4' 11.5" (1.511 m), weight 54 kg, SpO2 100 %.   Intake/Output Summary (Last 24 hours) at 06/03/2021 0923 Last data filed at 06/02/2021 2200 Gross per 24 hour  Intake 3 ml  Output --  Net 3 ml    Exam  Awake Alert, No new F.N deficits, mild generalized weakness in all 4 extremities  .AT,PERRAL Supple Neck,No JVD, No cervical lymphadenopathy appriciated.  Symmetrical Chest wall movement, Good air movement bilaterally, CTAB RRR,No Gallops,Rubs or new Murmurs, No Parasternal Heave +ve B.Sounds, Abd Soft, Non tender, No organomegaly appriciated, No rebound -guarding or rigidity. No Cyanosis, Clubbing or edema, No new Rash or bruise   Data Review   CBC w Diff:  Lab Results  Component Value Date   WBC 3.1 (L) 06/03/2021   HGB 11.1 (L) 06/03/2021   HGB 13.3 05/16/2021   HCT 32.1 (L) 06/03/2021   HCT 39.3 05/16/2021   PLT 172 06/03/2021   PLT 198 05/16/2021   LYMPHOPCT 35 06/03/2021   MONOPCT 8 06/03/2021   EOSPCT 1 06/03/2021   BASOPCT 1 06/03/2021    CMP:  Lab Results  Component Value Date   NA 135 06/03/2021   NA 133 (L) 05/15/2021   K 3.6 06/03/2021   CL 111 06/03/2021   CO2 19 (L) 06/03/2021   BUN <5 (L) 06/03/2021   BUN 3 (L) 05/15/2021   CREATININE  0.61 06/03/2021   PROT 7.4 06/03/2021   PROT 7.3 05/15/2021   ALBUMIN 2.7 (L) 06/03/2021   ALBUMIN 4.0 05/29/2021   BILITOT 0.4 06/03/2021   BILITOT 0.3 05/15/2021   ALKPHOS 52 06/03/2021   AST 78 (H) 06/03/2021   ALT 58 (H) 06/03/2021  .   Total Time in preparing paper work, data evaluation and todays exam - 35 minutes  Susa Raring M.D on 06/03/2021 at 9:23 AM  [facility]

## 2021-06-03 NOTE — Progress Notes (Signed)
Patient was discharged with all of her belongings. Patient was educated on where to pick up her medications and was given her discharge papers. Patient walked with husband, RN assistance was not needed to get to the car.   Mikki Harbor, RN

## 2021-06-03 NOTE — Discharge Instructions (Signed)
Follow with Primary MD Julieanne Manson, MD in 7 days   Get CBC, CMP checked next visit within 1 week by Primary MD   Activity: As tolerated with Full fall precautions use walker/cane & assistance as needed  Disposition Home    Diet: Heart Healthy    Special Instructions: If you have smoked or chewed Tobacco  in the last 2 yrs please stop smoking, stop any regular Alcohol  and or any Recreational drug use.  On your next visit with your primary care physician please Get Medicines reviewed and adjusted.  Please request your Prim.MD to go over all Hospital Tests and Procedure/Radiological results at the follow up, please get all Hospital records sent to your Prim MD by signing hospital release before you go home.  If you experience worsening of your admission symptoms, develop shortness of breath, life threatening emergency, suicidal or homicidal thoughts you must seek medical attention immediately by calling 911 or calling your MD immediately  if symptoms less severe.  You Must read complete instructions/literature along with all the possible adverse reactions/side effects for all the Medicines you take and that have been prescribed to you. Take any new Medicines after you have completely understood and accpet all the possible adverse reactions/side effects.

## 2021-06-03 NOTE — Plan of Care (Addendum)
NEUROLOGY CONSULTATION PROGRESS NOTE   Date of service: June 03, 2021 Patient Name: Audrey [name] MRN:  [id number] DOB:  1981-01-13  Interval Hx   Attempted to see patient but I was occuppied with code strokes in the morning and she was discharged just prior to my arrival at her room at 12:05 PM  Vitals   Vitals:   06/02/21 2312 06/03/21 0015 06/03/21 0353 06/03/21 0700  BP:    105/66  Pulse:      Resp:  20    Temp: 98 F (36.7 C) 98 F (36.7 C) 97.9 F (36.6 C) 98.2 F (36.8 C)  TempSrc: Oral Oral Oral   SpO2:      Weight:      Height:         Body mass index is 23.64 kg/m.  Physical Exam   --- Per last exam from Dr. Leonel Ramsay -- unable to examine patient personally prior to discharge  General: sitting in chair; in no acute distress.  Skin: No rash. Normal palpation of skin.    Neurologic Examination  Mental status/Cognition: Alert, interactive and appropriate  Speech/language: Fluent, comprehension intact,  Cranial nerves:   CN II Pupils equal and reactive to light, no VF deficits    CN III,IV,VI EOM intact, no gaze preference or deviation, no nystagmus    CN V She has mildly decreased sensation in the preauricular area on the left   CN VII no asymmetry, no nasolabial fold flattening    CN VIII normal hearing to speech    CN IX & X normal palatal elevation, no uvular deviation    Motor:  Muscle bulk: Normal, tone normal, pronator drift absent   Mvmt Root Nerve  Muscle Right Left Comments  SA C5/6 Ax Deltoid 4+ 4+   EF C5/6 Mc Biceps 4+ 4+   EE C6/7/8 Rad Triceps 4 4   WF C6/7 Med FCR 4+ 4+   WE C7/8 PIN ECU 4+ 4+           HF L1/2/3 Fem Illopsoas 4 4           DF L4/5 D Peron Tib Ant 4 4   PF S1/2 Tibial Grc/Sol 4 4    Reflexes: Absent throughout  Sensation: Decreased to LT in the lateral aspect of the left lower leg and lateral aspect of the proximal left arm.   Coordination/Complex Motor: Enhanced physiological tremor   Labs  Summarized   TSH 1.6 RPR-nonreactive B6-normal B1-pending B12-1145 MMA-normal Folate 35.7 (normal) ESR and CRP were normal  SPEP normal CK 32 (ref range 38-234; 12/02/18: 21, 07/20/18: 48)  Lyme negative  CSF WBC and RBC were both zero CSF protein-normal at 19 CSF glucose-53 CSF HIV PCR-negative CSF VDRL-nonreactive CSF HSV1/2 DNA negative, CMV negative Parvovirus B12 negative CSF OCBs: Zero (0) oligoclonal bands were observed in the CSF.  IgG index normal (0.16) CSF ACE negative  Imaging and Diagnostic studies   MRV - negative 05/31/2021 MRI brain and cervical spine 05/28/2021 normal MRI left femus 05/29/2021 negative for myositis  Impression   Audrey [name] is a 40 y.o. 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    Per Dr. Cecil Cobbs last assessment: "The distribution of the numbness is quite unusual as is the time course of the weakness.  Given her strong history of autoimmune disease, decision was made to proceed with IVIG treatment, thus  far she has not noticed much effect.  I would be hesitant to pursue much more aggressive autoimmune therapy without further objective evidence of the process that is ongoing.  I think that after IVIG treatment, I would favor having her follow-up with both rheumatology as well as an outpatient neuromuscular expert for an outpatient EMG and to monitor for IVIG response."  Discussed with Dr. Posey Pronto who is amenable to see patient within next 2 weeks.    Recommendations  - s/p IVIG 0.80m/Kg Q24 hours x 5 doses (completed 7/10), - Outpatient EMG/NCS to be scheduled with Dr. DNarda Amber______________________________________________________________________   Thank you for the opportunity to take part in the care of this patient. If you have any further questions, please contact the neurology consultation  attending.  Signed,  SLesleigh NoeMD-PhD [facility] [phone] If 7pm- 7am, please page neurology on call as listed in [facility]

## 2021-06-03 NOTE — TOC Transition Note (Addendum)
Transition of Care (TOC) - CM/SW Discharge Note Donn Pierini RN,BSN Transitions of Care Unit 4NP (non trauma) - RN Case Manager See Treatment Team for direct Phone #    Patient Details  Name: Audrey [name] MRN: [id number] Date of Birth: [birth year]  Transition of Care [facility]) CM/SW Contact:  Darrold Span, RN Phone Number: 06/03/2021, 12:14 PM   Clinical Narrative:    Pt stable for transition home today w/ spouse, order placed for DME and outpt referral for PT/OT. Pt is un-insured. [employer] called for charity DME need referral- RW to be delivered to room prior to discharge.  Referral made to [facility]- for outpt PT/OT per MD verbal order. Info placed on AVS for pt to follow up on referral.   1245- notified by OT that pt also needs shower chair for home- [employer] called and asked to also add shower chair to DME referral under charity- shower chair to be delivered to room prior to discharge.  1400- per bedside RN- pt left prior to shower chair being delivered.    Final next level of care: OP Rehab Barriers to Discharge: No Barriers Identified   Patient Goals and CMS Choice    [facility]    Discharge Placement               Home         Discharge Plan and Services In-house Referral: Interpreting Services Discharge Planning Services: CM Consult Post Acute Care Choice: NA (charity)          DME Arranged: Dan Humphreys rolling DME Agency: [employer] Date DME Agency Contacted: 06/03/21 Time DME Agency Contacted: 1100 Representative spoke with at DME Agency: Velna Hatchet HH Arranged: NA HH Agency: NA        Social Determinants of Health (SDOH) Interventions     Readmission Risk Interventions Readmission Risk Prevention Plan 06/03/2021  Post Dischage Appt Complete  Medication Screening Complete  Transportation Screening Complete  Some recent data might be hidden

## 2021-06-03 NOTE — Telephone Encounter (Signed)
LCSW contacted patient to discuss therapy appointment 07/12 9am, however per PCP and EHR patient was hospitalized and per EHR got discharged on 07/11. To allow recovery LCSW contacted patient to cancel on her behalf and left voicemail to terminate therapeutic relationship. LCSW informed last day of clinician is Friday if needed during this week she is welcome to call. LCSW will be putting her name on list for next clinician to contact to continue care.

## 2021-06-03 NOTE — Progress Notes (Signed)
Occupational Therapy Treatment Note    06/03/21 1200  OT Visit Information  Last OT Received On 06/03/21  Assistance Needed +1  History of Present Illness Pt is 39 yo female admitted on 05/28/21 with Bil hand weakness and contractures, LE weakness and numbness, and headache.  MRI of brain and C-spine unremarkable.  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 History includes lupus, GERD, arthritis, migraines, and anemia.  Precautions  Precautions Fall  Pain Assessment  Pain Assessment Faces  Faces Pain Scale 2  Pain Location headache  Pain Descriptors / Indicators Headache  Pain Intervention(s) Limited activity within patient's tolerance  Cognition  Arousal/Alertness Awake/alert  Behavior During Therapy WFL for tasks assessed/performed  Overall Cognitive Status Within Functional Limits for tasks assessed  General Comments slow processing  General Comments  General comments (skin integrity, edema, etc.) writtent HEP provided in spainish for fine motor/coordination, grip and pinch strnegthening and BUE strengthening with theraband. Pt/husband verbalized understanding  OT - End of Session  Activity Tolerance Patient tolerated treatment well  Patient left in chair;with call bell/phone within reach;with family/visitor present  Nurse Communication Mobility status;Other (comment)  OT Assessment/Plan  OT Plan All goals met and education completed, patient discharged from OT services (further OT to be addressed in outpt setting if services available)  OT Visit Diagnosis Unsteadiness on feet (R26.81);Muscle weakness (generalized) (M62.81);Other abnormalities of gait and mobility (R26.89);Pain;Dizziness and giddiness (R42);Low vision, both eyes (H54.2)  Pain - part of body  (headache)  Follow Up Recommendations Outpatient OT  OT Equipment Tub/shower seat  AM-PAC OT "6 Clicks" Daily Activity Outcome Measure (Version 2)  Help from another person  eating meals? 4  Help from another person taking care of personal grooming? 3  Help from another person toileting, which includes using toliet, bedpan, or urinal? 3  Help from another person bathing (including washing, rinsing, drying)? 3  Help from another person to put on and taking off regular upper body clothing? 3  Help from another person to put on and taking off regular lower body clothing? 3  6 Click Score 19  OT Goal Progression  Progress towards OT goals Goals met/education completed, patient discharged from OT (continue with outpt OT if able)  Acute Rehab OT Goals  Patient Stated Goal get stronger; return home  OT Goal Formulation With patient  Time For Goal Achievement 06/13/21  Potential to Achieve Goals Good  ADL Goals  Pt Will Perform Lower Body Dressing with modified independence;sit to/from stand  Pt Will Transfer to Toilet with modified independence;ambulating  Pt Will Perform Tub/Shower Transfer Shower transfer;ambulating;with modified independence  Additional ADL Goal #1 Pt will independently verbalize 3 energy conservation strateiges  Additional ADL Goal #2 Pt will verbalize 2 strateiges to reduce risk of falls  OT Time Calculation  OT Start Time (ACUTE ONLY) 1045  OT Stop Time (ACUTE ONLY) 1055  OT Time Calculation (min) 10 min  OT General Charges  $OT Visit 1 Visit  OT Treatments  $Therapeutic Exercise 8-22 mins   , OT/L   Acute OT Clinical Specialist Acute Rehabilitation Services Pager [phone] Office [phone]  

## 2021-06-04 ENCOUNTER — Other Ambulatory Visit: Payer: Self-pay | Admitting: Clinical

## 2021-06-04 LAB — VITAMIN B1: Vitamin B1 (Thiamine): 101.8 nmol/L (ref 66.5–200.0)

## 2021-06-10 ENCOUNTER — Ambulatory Visit: Payer: Self-pay | Admitting: Internal Medicine

## 2021-06-10 ENCOUNTER — Other Ambulatory Visit: Payer: Self-pay

## 2021-06-13 ENCOUNTER — Other Ambulatory Visit: Payer: Self-pay

## 2021-06-13 ENCOUNTER — Ambulatory Visit: Payer: Self-pay | Admitting: Internal Medicine

## 2021-06-13 ENCOUNTER — Ambulatory Visit (INDEPENDENT_AMBULATORY_CARE_PROVIDER_SITE_OTHER): Payer: Self-pay | Admitting: Neurology

## 2021-06-13 ENCOUNTER — Encounter: Payer: Self-pay | Admitting: Neurology

## 2021-06-13 VITALS — BP 111/78 | HR 61 | Resp 18 | Ht 62.0 in | Wt 113.0 lb

## 2021-06-13 DIAGNOSIS — R202 Paresthesia of skin: Secondary | ICD-10-CM

## 2021-06-13 NOTE — Progress Notes (Signed)
[name] Clinic Note - Initial Visit   Date: 06/13/21  Audrey [name] MRN: [id number] DOB: 1981-03-30   Dear Dr. Curly Shores:  Thank you for your kind referral of Audrey [name] for consultation of generalized paresthesias. Although her history is well known to you, please allow [name] to reiterate it for the purpose of our medical record. The patient was accompanied to the clinic by Spanish interpretor who also provides collateral information.     History of Present Illness: Audrey [name] is a 40 y.o. 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  She was hospitalized from 7/5 - 06/03/2021 for generalized weakness, numbness, dizziness, hand contractures, and headache which had been progressive over the past several weeks. Neurology evaluated the patient and ordered CSF testing to evaluate for CIDP.  CSF testing was normal.  She was empirically treated with IVIG x 5 days for CIDP, however she did not have any improvement during her stay.  She was discharged with out-patient PT with a walker.  Over the past few days, she has stopped using a walker and says her strength has improved.  She continues to have numbness over the left arm and thigh, but otherwise sensation is back to normal.    Out-side paper records, electronic medical record, and images have been reviewed where available and summarized as: TSH 1.6 RPR-nonreactive B6-normal B1-101 B12-1145 MMA-normal Folate 35.7 (normal) ESR and CRP were normal   SPEP normal CK 32 (ref range 38-234; 12/02/18: 21, 07/20/18: 48)   Lyme negative   CSF WBC and RBC were both zero CSF protein-normal at 19 CSF glucose-53 CSF HIV PCR-negative CSF VDRL-nonreactive CSF HSV1/2 DNA negative, CMV negative Parvovirus B12 negative CSF OCBs: Zero (0) oligoclonal bands were observed in the CSF.  IgG index  normal (0.16) CSF ACE negative   MRV - negative 05/31/2021 MRI brain and cervical spine 05/28/2021 normal MRI left femur 05/29/2021 negative for myositis    Past Medical History:  Diagnosis Date   Lupus ([place])    Symptomatic anemia 12/03/2018    Past Surgical History:  Procedure Laterality Date   DILATION AND CURETTAGE OF UTERUS  2007   following SAB   LAPAROSCOPIC CHOLECYSTECTOMY  2009     Medications:  Outpatient Encounter Medications as of 06/13/2021  Medication Sig   acetaminophen (TYLENOL) 500 MG tablet Take 1 tablet (500 mg total) by mouth every 8 (eight) hours as needed for moderate pain.   calcium-vitamin D (OSCAL WITH D) 500-200 MG-UNIT tablet Take 2 tablets by mouth daily with breakfast.   ferrous gluconate (FERGON) 324 MG tablet Take 1 tablet (324 mg total) by mouth 2 (two) times daily with a meal.   folic acid (FOLVITE) 1 MG tablet Take 1 mg by mouth daily.   hydroxychloroquine (PLAQUENIL) 200 MG tablet 2 tabs by mouth once daily   ibuprofen (ADVIL) 400 MG tablet Take 1 tablet (400 mg total) by mouth every 8 (eight) hours as needed for fever, headache or moderate pain.   levonorgestrel (MIRENA) 20 MCG/24HR IUD 1 each by Intrauterine route once.   methotrexate (RHEUMATREX) 2.5 MG tablet Take 2.5 mg by mouth once a week. Take 8 tablets by mouth once a week   omeprazole (PRILOSEC) 40 MG capsule 1 cap by mouth 1 hour before breakfast daily   topiramate (TOPAMAX) 25 MG tablet 3 tabs by mouth at bedtime (Patient not taking: Reported on 06/13/2021)   No facility-administered encounter medications on  file as of 06/13/2021.    Allergies: No Known Allergies  Family History: Family History  Problem Relation Age of Onset   Cancer Mother        Esophageal.   Diabetes Mother    Hypertension Father    Irritable bowel syndrome Sister    Hyperlipidemia Son     Social History: Social History   Tobacco Use   Smoking status: Never   Smokeless tobacco: Never  Vaping Use    Vaping Use: Never used  Substance Use Topics   Alcohol use: No   Drug use: Never   Social History   Social History Narrative   Lives with husband and 3 children kids.   Right handed   Drinks caffeine   Two story home    Vital Signs:  BP 111/78   Pulse 61   Resp 18   Ht '5\' 2"'  (1.575 m)   Wt 113 lb (51.3 kg)   SpO2 99%   BMI 20.67 kg/m     Neurological Exam: MENTAL STATUS including orientation to time, place, person, recent and remote memory, attention span and concentration, language, and fund of knowledge is normal.  Speech is not dysarthric.  CRANIAL NERVES: II:  No visual field defects.   III-IV-VI: Pupils equal round and reactive to light.  Normal conjugate, extra-ocular eye movements in all directions of gaze.  No nystagmus.  No ptosis.   V:  Normal facial sensation.    VII:  Normal facial symmetry and movements.   VIII:  Normal hearing and vestibular function.   IX-X:  Normal palatal movement.   XI:  Normal shoulder shrug and head rotation.   XII:  Normal tongue strength and range of motion, no deviation or fasciculation.  MOTOR:  There is some give-way weakness throughout, but with repeated testing and encouragement, all motor groups are 5/5. No atrophy, fasciculations or abnormal movements.  No pronator drift.   Upper Extremity:  Right  Left  Deltoid  5/5   5/5   Biceps  5/5   5/5   Triceps  5/5   5/5   Infraspinatus 5/5  5/5  Medial pectoralis 5/5  5/5  Wrist extensors  5/5   5/5   Wrist flexors  5/5   5/5   Finger extensors  5/5   5/5   Finger flexors  5/5   5/5   Dorsal interossei  5/5   5/5   Abductor pollicis  5/5   5/5   Tone (Ashworth scale)  0  0   Lower Extremity:  Right  Left  Hip flexors  5/5   5/5   Hip extensors  5/5   5/5   Adductor 5/5  5/5  Abductor 5/5  5/5  Knee flexors  5/5   5/5   Knee extensors  5/5   5/5   Dorsiflexors  5/5   5/5   Plantarflexors  5/5   5/5   Toe extensors  5/5   5/5   Toe flexors  5/5   5/5   Tone  (Ashworth scale)  0  0   MSRs:  Right        Left                  brachioradialis 2+  2+  biceps 2+  2+  triceps 2+  2+  patellar 1+  1+  ankle jerk 0  0  Hoffman no  no  plantar response down  down   SENSORY:  Normal and symmetric perception of light touch, pinprick, vibration, and proprioception.  Romberg's sign absent.   COORDINATION/GAIT: Normal finger-to- nose-finger and heel-to-shin.  Intact rapid alternating movements bilaterally.  Able to rise from a chair without using arms.  Gait narrow based and stable. Tandem and stressed gait intact.    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  While admitted, there were some functional features to her exam so accurate assessment was difficult.  Exam today shows normal strength, sensation, and gait. She does have reduced reflexes in the legs and arreflexic at the ankles. Fortunately, she is doing much better.  I am not sure if there is CIDP.  To better characterize the nature of her symptoms, she will return for NCS/EMG of the left arm and leg. 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   Further recommendations pending results.   Thank you for allowing me to participate in patient's care.  If I can answer any additional questions, I would be pleased to do so.    Sincerely,    Kanaan Kagawa K. Posey Pronto, DO

## 2021-06-13 NOTE — Patient Instructions (Signed)
Nerve testing of the left arm and leg.  Do not apply any lotion or oil to your skin on the day of testing.  ELECTROMYOGRAM AND NERVE CONDUCTION STUDIES (EMG/NCS) INSTRUCTIONS  How to Prepare The neurologist conducting the EMG will need to know if you have certain medical conditions. Tell the neurologist and other EMG lab personnel if you: Have a pacemaker or any other electrical medical device Take blood-thinning medications Have hemophilia, a blood-clotting disorder that causes prolonged bleeding Bathing Take a shower or bath shortly before your exam in order to remove oils from your skin. Don't apply lotions or creams before the exam.  What to Expect You'll likely be asked to change into a hospital gown for the procedure and lie down on an examination table. The following explanations can help you understand what will happen during the exam.  Electrodes. The neurologist or a technician places surface electrodes at various locations on your skin depending on where you're experiencing symptoms. Or the neurologist may insert needle electrodes at different sites depending on your symptoms.  Sensations. The electrodes will at times transmit a tiny electrical current that you may feel as a twinge or spasm. The needle electrode may cause discomfort or pain that usually ends shortly after the needle is removed. If you are concerned about discomfort or pain, you may want to talk to the neurologist about taking a short break during the exam.  Instructions. During the needle EMG, the neurologist will assess whether there is any spontaneous electrical activity when the muscle is at rest - activity that isn't present in healthy muscle tissue - and the degree of activity when you slightly contract the muscle.  He or she will give you instructions on resting and contracting a muscle at appropriate times. Depending on what muscles and nerves the neurologist is examining, he or she may ask you to change positions  during the exam.  After your EMG You may experience some temporary, minor bruising where the needle electrode was inserted into your muscle. This bruising should fade within several days. If it persists, contact your primary care doctor.

## 2021-06-19 ENCOUNTER — Other Ambulatory Visit: Payer: Self-pay

## 2021-06-19 ENCOUNTER — Ambulatory Visit (INDEPENDENT_AMBULATORY_CARE_PROVIDER_SITE_OTHER): Payer: Self-pay | Admitting: Neurology

## 2021-06-19 DIAGNOSIS — R202 Paresthesia of skin: Secondary | ICD-10-CM

## 2021-06-19 NOTE — Procedures (Signed)
[facility]  [address], [address]  [place], [place] [address] Tel: [phone] Fax:  [phone] Test Date:  06/19/2021  Patient: Audrey [name] DOB: 1981-05-08 Physician: Nita Sickle, DO  Sex: Female Height: 5\' 2"  Ref Phys: , DO  ID#: Nita Sickle   Technician:    Patient Complaints: This is a 40 year old female referred for evaluation of subacute onset of generalized paresthesias and weakness.  NCV & EMG Findings: Extensive electrodiagnostic testing of the left upper and lower extremity shows:  All sensory responses including the left median, ulnar, radial, mixed palmar, sural, and superficial peroneal nerves are within normal limits. All motor responses including the left median, ulnar, peroneal, and tibial nerves are within normal limits. Left tibial H reflex and F-wave studies are within normal limits. There is no evidence of active or chronic motor axonal loss changes affecting any of the tested muscles.  Motor unit configuration and recruitment pattern is within normal limits.  Impression: This is a normal study of the left upper and lower extremities.  In particular, there is no evidence of a sensorimotor polyneuropathy or cervical/lumbosacral radiculopathy.   ___________________________ 24, DO    Nerve Conduction Studies Anti Sensory Summary Table   Stim Site NR Peak (ms) Norm Peak (ms) P-T Amp (V) Norm P-T Amp  Left Median Anti Sensory (2nd Digit)  34C  Wrist    2.9 <3.4 27.9 >20  Left Radial Anti Sensory (Base 1st Digit)  34C  Wrist    2.3 <2.7 23.5 >18  Left Sup Peroneal Anti Sensory (Ant Lat Mall)  34C  12 cm    2.6 <4.5 9.4 >5  Left Sural Anti Sensory (Lat Mall)  34C  Calf    2.9 <4.5 24.7 >5  Left Ulnar Anti Sensory (5th Digit)  34C  Wrist    3.0 <3.1 16.4 >12   Motor Summary Table   Stim Site NR Onset (ms) Norm Onset (ms) O-P Amp (mV) Norm O-P Amp Site1 Site2 Delta-0 (ms) Dist (cm) Vel (m/s) Norm Vel  (m/s)  Left Median Motor (Abd Poll Brev)  34C  Wrist    3.0 <3.9 12.6 >6 Elbow Wrist 4.3 26.0 60 >50  Elbow    7.3  12.3         Left Peroneal Motor (Ext Dig Brev)  34C  Ankle    3.8 <5.5 5.6 >3 B Fib Ankle 7.1 34.0 48 >40  B Fib    10.9  5.3  Poplt B Fib 1.3 7.0 54 >40  Poplt    12.2  5.1         Left Tibial Motor (Abd Hall Brev)  34C  Ankle    3.7 <6.0 17.5 >8 Knee Ankle 7.4 35.0 47 >40  Knee    11.1  14.8         Left Ulnar Motor (Abd Dig Minimi)  34C  Wrist    2.2 <3.1 10.0 >7 B Elbow Wrist 3.0 19.0 63 >50  B Elbow    5.2  9.0  A Elbow B Elbow 1.8 10.0 56 >50  A Elbow    7.0  8.8          Comparison Summary Table   Stim Site NR Peak (ms) Norm Peak (ms) P-T Amp (V) Site1 Site2 Delta-P (ms) Norm Delta (ms)  Left Median/Ulnar Palm Comparison (Wrist - 8cm)  34C  Median Palm    1.9 <2.2 39.0 Median Palm Ulnar Palm 0.1   Ulnar Nita Sickle  1.8 <2.2 12.3       F Wave Studies   NR F-Lat (ms) Lat Norm (ms) L-R F-Lat (ms)  Left Tibial (Mrkrs) (Abd Hallucis)  34C     43.60 <55    H Reflex Studies   NR H-Lat (ms) Lat Norm (ms) L-R H-Lat (ms)  Left Tibial (Gastroc)  34C     28.03 <35    EMG   Side Muscle Ins Act Fibs Psw Fasc Number Recrt Dur Dur. Amp Amp. Poly Poly. Comment  Left AntTibialis Nml Nml Nml Nml Nml Nml Nml Nml Nml Nml Nml Nml N/A  Left Gastroc Nml Nml Nml Nml Nml Nml Nml Nml Nml Nml Nml Nml N/A  Left Flex Dig Long Nml Nml Nml Nml Nml Nml Nml Nml Nml Nml Nml Nml N/A  Left RectFemoris Nml Nml Nml Nml Nml Nml Nml Nml Nml Nml Nml Nml N/A  Left GluteusMed Nml Nml Nml Nml Nml Nml Nml Nml Nml Nml Nml Nml N/A  Left Lumbo Parasp Low Nml Nml Nml Nml Nml Nml Nml Nml Nml Nml Nml Nml N/A  Left 1stDorInt Nml Nml Nml Nml Nml Nml Nml Nml Nml Nml Nml Nml N/A  Left PronatorTeres Nml Nml Nml Nml Nml Nml Nml Nml Nml Nml Nml Nml N/A  Left Biceps Nml Nml Nml Nml Nml Nml Nml Nml Nml Nml Nml Nml N/A  Left Triceps Nml Nml Nml Nml Nml Nml Nml Nml Nml Nml Nml Nml N/A  Left Deltoid Nml  Nml Nml Nml Nml Nml Nml Nml Nml Nml Nml Nml N/A      Waveforms:

## 2021-06-20 ENCOUNTER — Encounter: Payer: Self-pay | Admitting: Internal Medicine

## 2021-06-20 ENCOUNTER — Ambulatory Visit: Payer: Self-pay | Admitting: Internal Medicine

## 2021-06-20 VITALS — BP 138/90 | HR 84 | Resp 20 | Ht 62.0 in | Wt 113.0 lb

## 2021-06-20 DIAGNOSIS — F410 Panic disorder [episodic paroxysmal anxiety] without agoraphobia: Secondary | ICD-10-CM

## 2021-06-20 NOTE — Progress Notes (Signed)
    Subjective:    Patient ID: Audrey [name], female   DOB: 1981/06/17, 40 y.o.   MRN: [id number]   HPI  Interpreted by Tildon Husky  Panic disorder:  Unable to use Epic when last seen on 06/10/2021, so new start of Sertraline not documented in chart.  She finished her 7 days of 25 mg daily and increased to 50 mg yesterday.   Past 2 days, has felt warmth in chest.  Did just start her period and wonders if that could be why.   States her problems with headache, numbness and tingling, early satiety with eating are still present, but less so.  She actually relates she is feeling better.    No suicidal ideation. Nerve conduction study/EMG performed yesterday was normal.  She is no longer taking the Topamax.  Stopped about 1 week before coming to office on the 18th per patient.    Current Meds  Medication Sig   acetaminophen (TYLENOL) 500 MG tablet Take 1 tablet (500 mg total) by mouth every 8 (eight) hours as needed for moderate pain.   calcium-vitamin D (OSCAL WITH D) 500-200 MG-UNIT tablet Take 2 tablets by mouth daily with breakfast.   ferrous gluconate (FERGON) 324 MG tablet Take 1 tablet (324 mg total) by mouth 2 (two) times daily with a meal.   folic acid (FOLVITE) 1 MG tablet Take 1 mg by mouth daily.   hydroxychloroquine (PLAQUENIL) 200 MG tablet 2 tabs by mouth once daily   ibuprofen (ADVIL) 400 MG tablet Take 1 tablet (400 mg total) by mouth every 8 (eight) hours as needed for fever, headache or moderate pain.   levonorgestrel (MIRENA) 20 MCG/24HR IUD 1 each by Intrauterine route once.   methotrexate (RHEUMATREX) 2.5 MG tablet Take 2.5 mg by mouth once a week. Take 8 tablets by mouth once a week   sertraline (ZOLOFT) 100 MG tablet Take 100 mg by mouth daily. 1/2 tab by mouth daily to start when complete 25 mg tabs   sertraline (ZOLOFT) 25 MG tablet Take 25 mg by mouth daily. 1 tab by mouth daily for 7 days then take 2 tabs by mouth daily thereafter.   No Known  Allergies   Review of Systems    Objective:   BP 138/90 (BP Location: Left Arm, Patient Position: Sitting, Cuff Size: Normal)   Pulse 84   Resp 20   Ht 5\' 2"  (1.575 m)   Wt 113 lb (51.3 kg)   BMI 20.67 kg/m   Physical Exam NAD Appears more energetic today.     Assessment & Plan   Panic Disorder:  continue Sertraline 50 mg daily.  Will switch to 1/2 of the Sertraline 100 mg tab when out of the 25 mg tabs.   Follow up in 6 weeks.

## 2021-06-20 NOTE — Patient Instructions (Signed)
[facility]  Directions  Website Address: [address], [place], [place] [address]  Phone: [phone]    

## 2021-08-01 ENCOUNTER — Ambulatory Visit: Payer: Self-pay | Admitting: Internal Medicine

## 2021-08-05 ENCOUNTER — Encounter: Payer: Self-pay | Admitting: Internal Medicine

## 2021-08-05 ENCOUNTER — Ambulatory Visit: Payer: Self-pay | Admitting: Internal Medicine

## 2021-08-05 ENCOUNTER — Other Ambulatory Visit: Payer: Self-pay

## 2021-08-05 VITALS — BP 124/80 | HR 70 | Resp 12 | Ht 62.0 in | Wt 118.0 lb

## 2021-08-05 DIAGNOSIS — F410 Panic disorder [episodic paroxysmal anxiety] without agoraphobia: Secondary | ICD-10-CM

## 2021-08-05 DIAGNOSIS — M542 Cervicalgia: Secondary | ICD-10-CM

## 2021-08-05 DIAGNOSIS — M329 Systemic lupus erythematosus, unspecified: Secondary | ICD-10-CM

## 2021-08-05 DIAGNOSIS — M549 Dorsalgia, unspecified: Secondary | ICD-10-CM | POA: Insufficient documentation

## 2021-08-05 MED ORDER — SERTRALINE HCL 100 MG PO TABS: ORAL_TABLET | ORAL | 6 refills | Status: DC

## 2021-08-05 NOTE — Patient Instructions (Addendum)
[facility] Department [address] [place], [place], [address] [facility] at [phone] .   [facility]  Directions  Website Address: [address] [place], [place], [place] [address]  Phone: [phone]

## 2021-08-05 NOTE — Progress Notes (Signed)
    Subjective:    Patient ID: Audrey [name], female   DOB: Mar 30, 1981, 40 y.o.   MRN: [id number]   HPI   Panic Disorder:  Has been taking Sertraline now at 50 mg daily for about 10 weeks.  She feels she is back to being herself.  No panic attacks now for 3 weeks.  The last episode was also much less severe.  Her family has noted her to be more interested in going and doing things with them out in public.  Also will have more conversation with them.  Numbness and other neurologic symptoms have resolved with treatment for Panic disorder.   She has not contacted [facility] [place] for counseling to learn calming techniques, etc.    2.  IUD--needs to have removed/replaced:  Discussed can get this done at the [facility].    3.  SLE:  states her Rheumatologist has told her she has fibromyalgia as she is having upper back pain going up to her nuchal area.    Current Meds  Medication Sig   acetaminophen (TYLENOL) 500 MG tablet Take 1 tablet (500 mg total) by mouth every 8 (eight) hours as needed for moderate pain.   calcium-vitamin D (OSCAL WITH D) 500-200 MG-UNIT tablet Take 2 tablets by mouth daily with breakfast.   ferrous gluconate (FERGON) 324 MG tablet Take 1 tablet (324 mg total) by mouth 2 (two) times daily with a meal.   folic acid (FOLVITE) 1 MG tablet Take 1 mg by mouth daily.   hydroxychloroquine (PLAQUENIL) 200 MG tablet 2 tabs by mouth once daily   ibuprofen (ADVIL) 400 MG tablet Take 1 tablet (400 mg total) by mouth every 8 (eight) hours as needed for fever, headache or moderate pain.   levonorgestrel (MIRENA) 20 MCG/24HR IUD 1 each by Intrauterine route once.   methotrexate (RHEUMATREX) 2.5 MG tablet Take 2.5 mg by mouth once a week. Take 8 tablets by mouth once a week   sertraline (ZOLOFT) 100 MG tablet Take 100 mg by mouth daily. 1/2 tab by mouth daily to start when complete 25 mg tabs   No Known Allergies   Review of Systems    Objective:   BP 124/80 (BP  Location: Left Arm, Patient Position: Sitting, Cuff Size: Normal)   Pulse 70   Resp 12   Ht 5\' 2"  (1.575 m)   Wt 118 lb (53.5 kg)   BMI 21.58 kg/m   Physical Exam NAD Looks so much better.  Coloring is good again. Tender over bilateral traps, paraspinous cervical musculature and medial to left scapula where she has palpable muscle spasm.  Tender along nuchal ridge.  Poor posture with shoulders slumped forward and core not engaged.   Assessment & Plan    Panic Disorder:  much improved with Sertraline.  Encouraged connecting with [facility] for counseling.  Encouraged taking Sertraline for at least 1 year before reassessing.    2.  Neck and back pain with tension headaches:  Discussed improved posture and core strength.  Referral to PT.    3.  IUD:  contact information for [facility] for removal.

## 2021-08-19 ENCOUNTER — Ambulatory Visit: Payer: Self-pay | Admitting: Neurology

## 2021-12-05 ENCOUNTER — Encounter: Payer: Self-pay | Admitting: Internal Medicine

## 2021-12-05 ENCOUNTER — Ambulatory Visit: Payer: Self-pay | Admitting: Internal Medicine

## 2021-12-05 ENCOUNTER — Other Ambulatory Visit: Payer: Self-pay

## 2021-12-05 VITALS — BP 114/68 | HR 80 | Resp 12 | Ht 62.0 in | Wt 125.0 lb

## 2021-12-05 DIAGNOSIS — Z9109 Other allergy status, other than to drugs and biological substances: Secondary | ICD-10-CM

## 2021-12-05 DIAGNOSIS — Z23 Encounter for immunization: Secondary | ICD-10-CM

## 2021-12-05 DIAGNOSIS — F410 Panic disorder [episodic paroxysmal anxiety] without agoraphobia: Secondary | ICD-10-CM

## 2021-12-05 DIAGNOSIS — M549 Dorsalgia, unspecified: Secondary | ICD-10-CM

## 2021-12-05 DIAGNOSIS — M542 Cervicalgia: Secondary | ICD-10-CM

## 2021-12-05 DIAGNOSIS — M329 Systemic lupus erythematosus, unspecified: Secondary | ICD-10-CM

## 2021-12-05 MED ORDER — FLUTICASONE PROPIONATE 50 MCG/ACT NA SUSP: 2.0000 | Freq: Every day | NASAL | Status: AC

## 2021-12-05 NOTE — Progress Notes (Unsigned)
° ° °  Subjective:    Patient ID: Audrey [name], female   DOB: October 14, 1981, 41 y.o.   MRN: [id number]   HPI  [name] interprets  Neck/back pain:  resolved since panic disorder controlled.   2.  Ear complaints:  Describes a fullness inside left ear.  Like when she is congested.  Opens up at times.  Does currently have posterior pharyngeal drainage, No itching of nose, eyes or throat.  Has had in past and does not recall a season this tends to occur.  Does not seem to be associated with mask wearing.    3.  SLE:  well controlled.  Due for eye check in February.  Goes to [facility]  4.  Panic Disorder:  well controlled.  Has been more than 4 months since last episode.      Current Meds  Medication Sig   acetaminophen (TYLENOL) 500 MG tablet Take 1 tablet (500 mg total) by mouth every 8 (eight) hours as needed for moderate pain.   calcium-vitamin D (OSCAL WITH D) 500-200 MG-UNIT tablet Take 2 tablets by mouth daily with breakfast.   ferrous gluconate (FERGON) 324 MG tablet Take 1 tablet (324 mg total) by mouth 2 (two) times daily with a meal.   folic acid (FOLVITE) 1 MG tablet Take 1 mg by mouth daily.   hydroxychloroquine (PLAQUENIL) 200 MG tablet 2 tabs by mouth once daily   ibuprofen (ADVIL) 400 MG tablet Take 1 tablet (400 mg total) by mouth every 8 (eight) hours as needed for fever, headache or moderate pain.   levonorgestrel (MIRENA) 20 MCG/24HR IUD 1 each by Intrauterine route once. 10 years   methotrexate (RHEUMATREX) 2.5 MG tablet Take 2.5 mg by mouth once a week. Take 8 tablets by mouth once a week   sertraline (ZOLOFT) 100 MG tablet 1/2 tab by mouth daily.   No Known Allergies   Review of Systems    Objective:   BP 114/68 (BP Location: Left Arm, Patient Position: Sitting, Cuff Size: Normal)    Pulse 80    Resp 12    Ht 5\' 2"  (1.575 m)    Wt 125 lb (56.7 kg)    LMP 12/02/2021    BMI 22.86 kg/m   Physical Exam   Assessment & Plan   ***

## 2022-02-25 ENCOUNTER — Telehealth: Payer: Self-pay

## 2022-02-25 NOTE — Telephone Encounter (Signed)
Error

## 2022-02-27 ENCOUNTER — Other Ambulatory Visit: Payer: Self-pay

## 2022-02-27 MED ORDER — SERTRALINE HCL 100 MG PO TABS: ORAL_TABLET | ORAL | 8 refills | Status: DC

## 2022-04-04 ENCOUNTER — Ambulatory Visit: Payer: Self-pay | Admitting: Internal Medicine

## 2022-04-04 ENCOUNTER — Encounter: Payer: Self-pay | Admitting: Internal Medicine

## 2022-04-04 VITALS — BP 110/70 | HR 70 | Resp 12 | Ht 62.0 in | Wt 125.5 lb

## 2022-04-04 DIAGNOSIS — Z0000 Encounter for general adult medical examination without abnormal findings: Secondary | ICD-10-CM

## 2022-04-04 DIAGNOSIS — M329 Systemic lupus erythematosus, unspecified: Secondary | ICD-10-CM

## 2022-04-04 DIAGNOSIS — Z1231 Encounter for screening mammogram for malignant neoplasm of breast: Secondary | ICD-10-CM

## 2022-04-04 DIAGNOSIS — Z23 Encounter for immunization: Secondary | ICD-10-CM

## 2022-04-04 MED ORDER — METHOTREXATE 2.5 MG PO TABS: ORAL_TABLET | ORAL | 1 refills | Status: DC

## 2022-04-04 NOTE — Progress Notes (Unsigned)
? ? ?Subjective:  ?  ?Patient ID: Audrey [name], female   DOB: April 25, 1981, 41 y.o.   MRN: [id number] ? ? ?HPI ? ?CPE without pap ? ?1.  Pap:  Performed at [facility] in January 2023 when IUD placed.  Normal.  Normal here in 2021. ? ?2.  Mammogram:  Never.  No family history of breast cancer.   ? ?3.  Osteoprevention:  Eats yogurt and cheese 4 times weekly.  Does take Calcium and Vitamin D twice daily.  She believes it is what was recommended dosage wise.  Walks around neighborhood 5 days weekly.  30 minutes each time.   ? ?4.  Guaiac Cards/FIT:  Guaiac negative in 2020.   ? ?5.  Colonoscopy:   Never.  No family history of colon cancer.    ? ?6.  Immunizations:  Has not had Pneumococcal 23 vaccine. ? ?7.  Glucose/Cholesterol:  History of prediabetes, but able to get A1C well into normal range in recent years.  Cholesterol was better with increasing HDL last spring ?Lipid Panel  ?   ?Component Value Date/Time  ? CHOL 148 03/04/2021 1135  ? TRIG 117 03/04/2021 1135  ? HDL 40 03/04/2021 1135  ? CHOLHDL 3.9 Ratio 05/23/2009 2137  ? VLDL 22 05/23/2009 2137  ? LDLCALC 87 03/04/2021 1135  ? LABVLDL 21 03/04/2021 1135  ? ? ? ?8.  SLE:  she has been off Methotrexate for a month.  Was given a refill for 2 weeks when went in for labs beginning of April.  Very mild decrease in WBCs and hemoglobin, which she has had on and off without MTX.   Renal and liver function testing were fine.  She did not call back to find out if she could get more refills.  Appt with Rheum in June. ? ?Current Meds  ?Medication Sig  ?? acetaminophen (TYLENOL) 500 MG tablet Take 1 tablet (500 mg total) by mouth every 8 (eight) hours as needed for moderate pain.  ?? calcium-vitamin D (OSCAL WITH D) 500-200 MG-UNIT tablet Take 2 tablets by mouth daily with breakfast.  ?? ferrous gluconate (FERGON) 324 MG tablet Take 1 tablet (324 mg total) by mouth 2 (two) times daily with a meal.  ?? fluticasone (FLONASE) 50 MCG/ACT nasal spray Place 2 sprays into both  nostrils daily.  ?? folic acid (FOLVITE) 1 MG tablet Take 1 mg by mouth daily.  ?? hydroxychloroquine (PLAQUENIL) 200 MG tablet 2 tabs by mouth once daily  ?? ibuprofen (ADVIL) 400 MG tablet Take 1 tablet (400 mg total) by mouth every 8 (eight) hours as needed for fever, headache or moderate pain.  ?? levonorgestrel (MIRENA) 20 MCG/24HR IUD 1 each by Intrauterine route once. 10 years  ?? sertraline (ZOLOFT) 100 MG tablet 1/2 tab by mouth daily.  ? ?No Known Allergies ? ? ?Review of Systems  ?Musculoskeletal:   ?     No rash and joints doing fine.   ?Has been out of MTX for a month.   ?Has not heard back about labs from Rheum performed beginning of April--mild anemia and decrease in total WBC, but this is a chronic on and off issue.    ? ? ? ?Objective:  ? ?BP 110/70 (BP Location: Left Arm, Patient Position: Sitting, Cuff Size: Normal)   Pulse 70   Resp 12   Ht 5\' 2"  (1.575 m)   Wt 125 lb 8 oz (56.9 kg)   LMP 03/19/2022 (Exact Date)   BMI 22.95 kg/m?  ? ?Physical  Exam ? ? ?Assessment & Plan  ? ?*** ? ?

## 2022-04-08 ENCOUNTER — Other Ambulatory Visit: Payer: Self-pay

## 2022-04-08 DIAGNOSIS — Z1231 Encounter for screening mammogram for malignant neoplasm of breast: Secondary | ICD-10-CM

## 2022-04-15 ENCOUNTER — Other Ambulatory Visit: Payer: Self-pay | Admitting: Internal Medicine

## 2022-04-15 DIAGNOSIS — Z0000 Encounter for general adult medical examination without abnormal findings: Secondary | ICD-10-CM

## 2022-04-16 LAB — COMPREHENSIVE METABOLIC PANEL
ALT: 15 IU/L (ref 0–32)
AST: 22 IU/L (ref 0–40)
Albumin/Globulin Ratio: 1.3 (ref 1.2–2.2)
Albumin: 4 g/dL (ref 3.8–4.8)
Alkaline Phosphatase: 103 IU/L (ref 44–121)
BUN/Creatinine Ratio: 10 (ref 9–23)
BUN: 7 mg/dL (ref 6–24)
Bilirubin Total: 0.4 mg/dL (ref 0.0–1.2)
CO2: 22 mmol/L (ref 20–29)
Calcium: 8.2 mg/dL — ABNORMAL LOW (ref 8.7–10.2)
Chloride: 102 mmol/L (ref 96–106)
Creatinine, Ser: 0.68 mg/dL (ref 0.57–1.00)
Globulin, Total: 3.1 g/dL (ref 1.5–4.5)
Glucose: 82 mg/dL (ref 70–99)
Potassium: 4 mmol/L (ref 3.5–5.2)
Sodium: 135 mmol/L (ref 134–144)
Total Protein: 7.1 g/dL (ref 6.0–8.5)
eGFR: 113 mL/min/{1.73_m2} (ref >59)

## 2022-04-16 LAB — CBC WITH DIFFERENTIAL/PLATELET
Basophils Absolute: 0 10*3/uL (ref 0.0–0.2)
Basos: 1 %
EOS (ABSOLUTE): 0 10*3/uL (ref 0.0–0.4)
Eos: 1 %
Hematocrit: 36.1 % (ref 34.0–46.6)
Hemoglobin: 12.2 g/dL (ref 11.1–15.9)
Immature Grans (Abs): 0 10*3/uL (ref 0.0–0.1)
Immature Granulocytes: 0 %
Lymphocytes Absolute: 1.2 10*3/uL (ref 0.7–3.1)
Lymphs: 35 %
MCH: 28.8 pg (ref 26.6–33.0)
MCHC: 33.8 g/dL (ref 31.5–35.7)
MCV: 85 fL (ref 79–97)
Monocytes Absolute: 0.3 10*3/uL (ref 0.1–0.9)
Monocytes: 8 %
Neutrophils Absolute: 1.9 10*3/uL (ref 1.4–7.0)
Neutrophils: 55 %
Platelets: 180 10*3/uL (ref 150–450)
RBC: 4.24 x10E6/uL (ref 3.77–5.28)
RDW: 13.1 % (ref 11.7–15.4)
WBC: 3.4 10*3/uL (ref 3.4–10.8)

## 2022-04-16 LAB — LIPID PANEL W/O CHOL/HDL RATIO
Cholesterol, Total: 149 mg/dL (ref 100–199)
HDL: 36 mg/dL — ABNORMAL LOW (ref >39)
LDL Chol Calc (NIH): 88 mg/dL (ref 0–99)
Triglycerides: 138 mg/dL (ref 0–149)
VLDL Cholesterol Cal: 25 mg/dL (ref 5–40)

## 2022-04-16 LAB — HEMOGLOBIN A1C
Est. average glucose Bld gHb Est-mCnc: 103 mg/dL
Hgb A1c MFr Bld: 5.2 % (ref 4.8–5.6)

## 2022-04-22 ENCOUNTER — Encounter: Payer: Self-pay | Admitting: Internal Medicine

## 2022-04-22 ENCOUNTER — Ambulatory Visit: Payer: Self-pay | Admitting: Internal Medicine

## 2022-04-22 VITALS — BP 120/66 | HR 92 | Resp 12 | Ht 62.0 in | Wt 128.0 lb

## 2022-04-22 DIAGNOSIS — B029 Zoster without complications: Secondary | ICD-10-CM

## 2022-04-22 MED ORDER — HYDROCODONE-ACETAMINOPHEN 5-325 MG PO TABS: ORAL_TABLET | ORAL | 0 refills | Status: DC

## 2022-04-22 MED ORDER — ACYCLOVIR 800 MG PO TABS: ORAL_TABLET | ORAL | 0 refills | Status: DC

## 2022-04-22 MED ORDER — CAPSAICIN 0.025 % EX LOTN: TOPICAL_LOTION | CUTANEOUS | 0 refills | Status: DC

## 2022-04-22 NOTE — Progress Notes (Signed)
     Subjective:    Patient ID: Audrey [name], female   DOB: September 28, 1981, 41 y.o.   MRN: [id number]   HPI  Audrey [name] interprets  Has had what started as an itchy rash--describes bumps under her left breast-but now painful--like intermittent shocks.   States the rash is spreading along the same level of her chest--to her back.   Unable to wear bra due to discomfort She did take her MTX this past Friday (4 days ago)  Current Meds  Medication Sig   acetaminophen (TYLENOL) 500 MG tablet Take 1 tablet (500 mg total) by mouth every 8 (eight) hours as needed for moderate pain.   calcium-vitamin D (OSCAL WITH D) 500-200 MG-UNIT tablet Take 2 tablets by mouth daily with breakfast.   ferrous gluconate (FERGON) 324 MG tablet Take 1 tablet (324 mg total) by mouth 2 (two) times daily with a meal.   fluticasone (FLONASE) 50 MCG/ACT nasal spray Place 2 sprays into both nostrils daily.   folic acid (FOLVITE) 1 MG tablet Take 1 mg by mouth daily.   hydroxychloroquine (PLAQUENIL) 200 MG tablet 2 tabs by mouth once daily   ibuprofen (ADVIL) 400 MG tablet Take 1 tablet (400 mg total) by mouth every 8 (eight) hours as needed for fever, headache or moderate pain.   levonorgestrel (MIRENA) 20 MCG/24HR IUD 1 each by Intrauterine route once. 10 years   methotrexate (RHEUMATREX) 2.5 MG tablet 8 tabs by mouth every Friday.   sertraline (ZOLOFT) 100 MG tablet 1/2 tab by mouth daily.   No Known Allergies   Review of Systems    Objective:   BP 120/66 (BP Location: Left Arm, Patient Position: Sitting, Cuff Size: Normal)   Pulse 92   Resp 12   Ht 5\' 2"  (1.575 m)   Wt 128 lb (58.1 kg)   LMP 04/13/2022   BMI 23.41 kg/m   Physical Exam NAD Vesicles in clusters--appear new on back at about T6 level-on erythematous base.  Older lesions, many scabbed over under left breast and axillary line.  Newer lesions at midline anteriorly at same T6 level    Assessment & Plan  Herpes Zoster:  Left T6  level.   Hold Methotrexate for at least this week.  If not healing by next week, will hold two weeks. Acyclovir 800 mg 5 times daily for 10 days. Capsaicin cream only to lesions not scabbed or open. Cool water soaks or showers. Push fluids. Hydrocodone for severe pain-especially at bedtime. Call progress on Friday. Will vaccinate after healed.  Discussed labs other than HDL being a bit low are fine--recommending regular exercise once she is feeling better.

## 2022-04-22 NOTE — Patient Instructions (Signed)
Cool bath soaks or showers

## 2022-04-25 ENCOUNTER — Telehealth: Payer: Self-pay

## 2022-04-25 NOTE — Telephone Encounter (Signed)
Since last visit patient has had fever 2 different night, sore throat and pain in her joints. Frontal temp 100.2 on Tuesday and Wednesday night. Has not had fever or sore throat since Wednesday, but has continued to have joint pain and headache. Skin looks like it is healing. Skin looks less red, less swollen, and no new lesions. Less pain in the area, and less itching.

## 2022-04-29 ENCOUNTER — Ambulatory Visit: Payer: Self-pay | Admitting: Internal Medicine

## 2022-04-30 ENCOUNTER — Encounter: Payer: Self-pay | Admitting: Internal Medicine

## 2022-04-30 ENCOUNTER — Ambulatory Visit: Payer: Self-pay | Admitting: Internal Medicine

## 2022-04-30 VITALS — BP 104/60 | HR 72 | Resp 12 | Ht 62.0 in | Wt 127.0 lb

## 2022-04-30 DIAGNOSIS — B029 Zoster without complications: Secondary | ICD-10-CM

## 2022-04-30 NOTE — Progress Notes (Signed)
    Subjective:    Patient ID: Audrey [name], female   DOB: Sep 27, 1981, 41 y.o.   MRN: [id number]   HPI  Herpes Zoster exanthem, left thoracic area.  Pain is much decreased.  Continues to use Capsaicin cream as needed.  Has hydrocodone as needed for nighttime/sleep with pain. One more day of 10 day course of Acyclovir left. She held her MTX last Friday only.  Due again this Friday.  Current Meds  Medication Sig   acetaminophen (TYLENOL) 500 MG tablet Take 1 tablet (500 mg total) by mouth every 8 (eight) hours as needed for moderate pain.   acyclovir (ZOVIRAX) 800 MG tablet 1 tab by mouth 5 times daily for 10 days.   calcium-vitamin D (OSCAL WITH D) 500-200 MG-UNIT tablet Take 2 tablets by mouth daily with breakfast.   Capsaicin 0.025 % LOTN Apply to rash without open lesions every 8 hours as needed.   ferrous gluconate (FERGON) 324 MG tablet Take 1 tablet (324 mg total) by mouth 2 (two) times daily with a meal.   fluticasone (FLONASE) 50 MCG/ACT nasal spray Place 2 sprays into both nostrils daily.   folic acid (FOLVITE) 1 MG tablet Take 1 mg by mouth daily.   HYDROcodone-acetaminophen (NORCO/VICODIN) 5-325 MG tablet 1 tab by mouth every 6 hours as needed for pain especially at bedtime   hydroxychloroquine (PLAQUENIL) 200 MG tablet 2 tabs by mouth once daily   ibuprofen (ADVIL) 400 MG tablet Take 1 tablet (400 mg total) by mouth every 8 (eight) hours as needed for fever, headache or moderate pain.   levonorgestrel (MIRENA) 20 MCG/24HR IUD 1 each by Intrauterine route once. 10 years   sertraline (ZOLOFT) 100 MG tablet 1/2 tab by mouth daily.   No Known Allergies   Review of Systems    Objective:   BP 104/60 (BP Location: Left Arm, Patient Position: Sitting, Cuff Size: Normal)   Pulse 72   Resp 12   Ht 5\' 2"  (1.575 m)   Wt 127 lb (57.6 kg)   LMP 04/13/2022   BMI 23.23 kg/m   Physical Exam  Clusters of scabbed over ulcerative lesions along likely T6 level front to  back on left.  No underlying erythema.  Very dry.   Assessment & Plan   Herpes Zoster, left thoracic area.  Healing nicely. To take MTX this Friday. Return in 1 month to see if pain resolved and recommend Shingrix to start then.

## 2022-05-15 ENCOUNTER — Ambulatory Visit: Payer: Self-pay | Admitting: *Deleted

## 2022-05-15 ENCOUNTER — Ambulatory Visit
Admission: RE | Admit: 2022-05-15 | Discharge: 2022-05-15 | Disposition: A | Discharge status: Home / Self Care | Payer: No Typology Code available for payment source | Source: Ambulatory Visit | Attending: Internal Medicine | Admitting: Internal Medicine

## 2022-05-15 VITALS — BP 118/70 | Wt 129.3 lb

## 2022-05-15 DIAGNOSIS — Z1231 Encounter for screening mammogram for malignant neoplasm of breast: Secondary | ICD-10-CM

## 2022-05-15 DIAGNOSIS — Z1239 Encounter for other screening for malignant neoplasm of breast: Secondary | ICD-10-CM

## 2022-05-15 NOTE — Patient Instructions (Signed)
Explained breast self awareness with [name]. Patient did not need a Pap smear today due to last Pap smear was 02/08/2020. Let her know BCCCP will cover Pap smears every 3 years unless has a history of abnormal Pap smears. Referred patient to the [facility] for a screening mammogram on mobile unit. Appointment scheduled Thursday, May 15, 2022 at 1430. Patient aware of appointment and will be there. Let patient know the Breast Center will follow up with her within the next couple weeks with results of mammogram by letter or phone. Audrey [name] verbalized understanding.  [name], Kathaleen Maser, RN 2:20 PM

## 2022-05-15 NOTE — Progress Notes (Signed)
[name] is a 41 y.o. female who presents to [facility] clinic today with no complaints.    Pap Smear: Pap smear not completed today. Last Pap smear was 02/08/2020 at [facility] clinic and was normal. Per patient has history of an abnormal Pap smear 17 years ago that a repeat Pap smear was completed for follow up. Patient stated all Pap smears have been normal since repeat Pap smear and that she has had at least three normal Pap smears. Last Pap smear result is available in Epic.   Physical exam: Breasts Breasts symmetrical. No skin abnormalities bilateral breasts. No nipple retraction bilateral breasts. No nipple discharge bilateral breasts. No lymphadenopathy. No lumps palpated bilateral breasts. No complaints of pain or tenderness on exam.   Pelvic/Bimanual Pap is not indicated today per BCCCP guidelines.   Smoking History: Patient has never smoked.   Patient Navigation: Patient education provided. Access to services provided for patient through [employer] program. Spanish interpreter Alene Mires at [facility] provided.    Breast and Cervical Cancer Risk Assessment: Patient does not have family history of breast cancer, known genetic mutations, or radiation treatment to the chest before age 60. Patient does not have history of cervical dysplasia, immunocompromised, or DES exposure in-utero.  Risk Assessment     Risk Scores       05/15/2022   Last edited by: Narda Rutherford, LPN   5-year risk: 0.3 %   Lifetime risk: 6.4 %            A: BCCCP exam without pap smear No complaints.  P: Referred patient to the [facility] for a screening mammogram on mobile unit. Appointment scheduled Thursday, May 15, 2022 at 1430.  Priscille Heidelberg, RN 05/15/2022 2:20 PM

## 2022-06-04 ENCOUNTER — Ambulatory Visit: Payer: Self-pay | Admitting: Internal Medicine

## 2022-06-04 ENCOUNTER — Encounter: Payer: Self-pay | Admitting: Internal Medicine

## 2022-06-04 VITALS — BP 110/70 | HR 62 | Resp 12 | Ht 61.0 in | Wt 127.5 lb

## 2022-06-04 DIAGNOSIS — Z23 Encounter for immunization: Secondary | ICD-10-CM

## 2022-06-04 DIAGNOSIS — B029 Zoster without complications: Secondary | ICD-10-CM

## 2022-06-04 NOTE — Progress Notes (Addendum)
    Subjective:    Patient ID: Audrey [name], female   DOB: [birth year], 41 y.o.   MRN: [id number]   HPI  Follow up of shingles.  Rash and pain are gone.  Feeling well. She is ready to be vaccinated with Shingrix.   Patient is at risk for recurrence with immunosuppression.    Current Meds  Medication Sig   calcium-vitamin D (OSCAL WITH D) 500-200 MG-UNIT tablet Take 2 tablets by mouth daily with breakfast.   ferrous gluconate (FERGON) 324 MG tablet Take 1 tablet (324 mg total) by mouth 2 (two) times daily with a meal.   fluticasone (FLONASE) 50 MCG/ACT nasal spray Place 2 sprays into both nostrils daily.   folic acid (FOLVITE) 1 MG tablet Take 1 mg by mouth daily.   hydroxychloroquine (PLAQUENIL) 200 MG tablet 2 tabs by mouth once daily   ibuprofen (ADVIL) 400 MG tablet Take 1 tablet (400 mg total) by mouth every 8 (eight) hours as needed for fever, headache or moderate pain.   levonorgestrel (MIRENA) 20 MCG/24HR IUD 1 each by Intrauterine route once. 10 years   methotrexate (RHEUMATREX) 2.5 MG tablet 8 tabs by mouth every Friday.   sertraline (ZOLOFT) 100 MG tablet 1/2 tab by mouth daily.   No Known Allergies   Review of Systems    Objective:   BP 110/70 (BP Location: Right Arm, Patient Position: Sitting, Cuff Size: Normal)   Pulse 62   Resp 12   Ht 5\' 1"  (1.549 m)   Wt 127 lb 8 oz (57.8 kg)   LMP 05/30/2022 (Exact Date)   BMI 24.09 kg/m   Physical Exam No rash, though left with significant hyperpigmentation with scarring from herpes zoster lesions, left mid thorax, including anterior/lateral/posterior chest NAD  Assessment & Plan   Herpes Zoster resolved Shingrix today at an early age due to recent disease and immunosuppression.

## 2022-06-19 ENCOUNTER — Other Ambulatory Visit: Payer: Self-pay

## 2022-06-19 MED ORDER — METHOTREXATE 2.5 MG PO TABS: ORAL_TABLET | ORAL | 0 refills | Status: AC

## 2022-10-07 ENCOUNTER — Ambulatory Visit: Payer: Self-pay | Admitting: Internal Medicine

## 2022-10-07 VITALS — BP 130/74 | HR 76 | Resp 16 | Ht 61.0 in | Wt 135.0 lb

## 2022-10-07 DIAGNOSIS — Z23 Encounter for immunization: Secondary | ICD-10-CM

## 2022-10-07 DIAGNOSIS — Z79899 Other long term (current) drug therapy: Secondary | ICD-10-CM

## 2022-10-07 DIAGNOSIS — M329 Systemic lupus erythematosus, unspecified: Secondary | ICD-10-CM

## 2022-10-07 DIAGNOSIS — R7989 Other specified abnormal findings of blood chemistry: Secondary | ICD-10-CM

## 2022-10-07 NOTE — Progress Notes (Incomplete)
    Subjective:    Patient ID: Audrey [name], female   DOB: 1981/09/03, 41 y.o.   MRN: [id number]   HPI  Tildon Husky interprets   Herpes Zoster:  rash resolved and no residual pain.    2.  SLE: patient was off MTX for time period of herpes zoster exanthem. Was to restart on 05/02/2022 after follow up and doing well with treatment.  She thinks she did do this and was not off for 1 to 2 months as has stated in past.  Feels SLE well controlled.  No joint pain.  Also, taking Plaquenil and states she is being seen once yearly now by ophthalmology at [facility]. Looking at her chart, she has not been seen there since 02/2021.  States last visit coincided with Rheum and so has not rescheduled.   Labs recently okay save for low C4 (chronic)  3.  Low serum calcium:  earlier thought due to low albumin with chronic illness, but albumin in normal range last check in May.  Taking Calcium/Vitamin D supplement once daily.   Walks 3-4 times weekly.     4.  Panic Disorder/anxiety:  No symptoms.  Taking Sertraline as recommended.    No side effects.    5.  HM:  needs second Shingrix in January and new COVID vaccine.  Current Meds  Medication Sig  . calcium-vitamin D (OSCAL WITH D) 500-200 MG-UNIT tablet Take 2 tablets by mouth daily with breakfast.  . ferrous gluconate (FERGON) 324 MG tablet Take 1 tablet (324 mg total) by mouth 2 (two) times daily with a meal.  . fluticasone (FLONASE) 50 MCG/ACT nasal spray Place 2 sprays into both nostrils daily.  . folic acid (FOLVITE) 1 MG tablet Take 1 mg by mouth daily.  . hydroxychloroquine (PLAQUENIL) 200 MG tablet 2 tabs by mouth once daily  . ibuprofen (ADVIL) 400 MG tablet Take 1 tablet (400 mg total) by mouth every 8 (eight) hours as needed for fever, headache or moderate pain.  Marland Kitchen levonorgestrel (MIRENA) 20 MCG/24HR IUD 1 each by Intrauterine route once. 10 years  . methotrexate (RHEUMATREX) 2.5 MG tablet 8 tabs by mouth every Friday.  . sertraline  (ZOLOFT) 100 MG tablet 1/2 tab by mouth daily.   No Known Allergies   Review of Systems    Objective:   BP 130/74 (BP Location: Left Arm, Patient Position: Sitting, Cuff Size: Normal)   Pulse 76   Resp 16   Ht 5\' 1"  (1.549 m)   Wt 135 lb (61.2 kg)   BMI 25.51 kg/m   Physical Exam NAD HEENT:  PERRL EOMI, Neck:  Supple, No adenopathy Chest:     Assessment & Plan   ***

## 2022-10-08 LAB — COMPREHENSIVE METABOLIC PANEL
ALT: 37 IU/L — ABNORMAL HIGH (ref 0–32)
AST: 30 IU/L (ref 0–40)
Albumin/Globulin Ratio: 1.7 (ref 1.2–2.2)
Albumin: 4.4 g/dL (ref 3.9–4.9)
Alkaline Phosphatase: 87 IU/L (ref 44–121)
BUN/Creatinine Ratio: 18 (ref 9–23)
BUN: 10 mg/dL (ref 6–24)
Bilirubin Total: 0.4 mg/dL (ref 0.0–1.2)
CO2: 22 mmol/L (ref 20–29)
Calcium: 8.6 mg/dL — ABNORMAL LOW (ref 8.7–10.2)
Chloride: 103 mmol/L (ref 96–106)
Creatinine, Ser: 0.57 mg/dL (ref 0.57–1.00)
Globulin, Total: 2.6 g/dL (ref 1.5–4.5)
Glucose: 100 mg/dL — ABNORMAL HIGH (ref 70–99)
Potassium: 3.7 mmol/L (ref 3.5–5.2)
Sodium: 139 mmol/L (ref 134–144)
Total Protein: 7 g/dL (ref 6.0–8.5)
eGFR: 117 mL/min/{1.73_m2} (ref >59)

## 2022-10-22 ENCOUNTER — Other Ambulatory Visit: Payer: Self-pay

## 2022-10-22 DIAGNOSIS — E559 Vitamin D deficiency, unspecified: Secondary | ICD-10-CM

## 2022-10-22 DIAGNOSIS — R7989 Other specified abnormal findings of blood chemistry: Secondary | ICD-10-CM

## 2022-10-24 LAB — CALCIUM, IONIZED: Calcium, Ion: 4.8 mg/dL (ref 4.5–5.6)

## 2022-10-24 LAB — VITAMIN D 25 HYDROXY (VIT D DEFICIENCY, FRACTURES): Vit D, 25-Hydroxy: 23.6 ng/mL — ABNORMAL LOW (ref 30.0–100.0)

## 2022-10-27 ENCOUNTER — Other Ambulatory Visit: Payer: Self-pay

## 2022-10-27 DIAGNOSIS — R0989 Other specified symptoms and signs involving the circulatory and respiratory systems: Secondary | ICD-10-CM

## 2022-10-27 LAB — POC COVID19 BINAXNOW: SARS Coronavirus 2 Ag: NEGATIVE

## 2022-10-27 NOTE — Progress Notes (Signed)
Patient came in because she was having headache and runny nose. Started this past Friday 10/24/22. Patient has been taking ibuprofen for headache which has been helping.  Covid test was negative. Patient told to call if symptoms worsen or if she develops any new symptom.

## 2022-11-24 DIAGNOSIS — E785 Hyperlipidemia, unspecified: Secondary | ICD-10-CM | POA: Insufficient documentation

## 2022-12-05 ENCOUNTER — Ambulatory Visit: Payer: Self-pay | Admitting: Internal Medicine

## 2022-12-12 ENCOUNTER — Ambulatory Visit (INDEPENDENT_AMBULATORY_CARE_PROVIDER_SITE_OTHER): Payer: Self-pay | Admitting: Internal Medicine

## 2022-12-12 DIAGNOSIS — Z23 Encounter for immunization: Secondary | ICD-10-CM

## 2023-02-25 ENCOUNTER — Other Ambulatory Visit: Payer: Self-pay

## 2023-02-25 DIAGNOSIS — R0981 Nasal congestion: Secondary | ICD-10-CM

## 2023-02-25 LAB — POC COVID19 BINAXNOW: SARS Coronavirus 2 Ag: NEGATIVE

## 2023-02-25 LAB — POCT RAPID STREP A (OFFICE): Rapid Strep A Screen: NEGATIVE

## 2023-02-25 LAB — POCT INFLUENZA A/B
Influenza A, POC: NEGATIVE
Influenza B, POC: NEGATIVE

## 2023-02-25 MED ORDER — AMOXICILLIN 500 MG PO CAPS: 500.0000 mg | ORAL_CAPSULE | Freq: Two times a day (BID) | ORAL | 0 refills | Status: AC

## 2023-02-25 NOTE — Progress Notes (Signed)
Patient came in for strep, covid and influenza test after experiencing symptom since yesterday midnight. Symptoms include headache, abdominal pain, joint pain, dizzy, congestion and light cough. Patient suspects strep as her daughter tested positive yesterday. Patient has taken ibuprofen 200mg  and tylenol 500mg  as needed.   While doing strep test, tonsil did not appear swollen. Tongue appeared yellow coating.  After discussing with Dr Amil Amen, amoxicillin 500mg  twice daily for 10 days was sent to the pharmacy. Patient was notified of Rx and was instructed to call us if symptoms do not improve.

## 2023-04-07 ENCOUNTER — Encounter: Payer: Self-pay | Admitting: Internal Medicine

## 2023-04-16 IMAGING — MR MR [PERSON_NAME] HEAD
3 series · 47 of 48 positions shown · non-contrast
Comparison: MRI head May 28, 2021.

CLINICAL DATA: Dural venous sinus thrombosis suspected.  Headache.

EXAM:
MRI head venogram.
TECHNIQUE: Multiplanar, multi-echo pulse sequences of the brain and surrounding
structures were acquired without intravenous contrast. Images of the
dural venous sinuses were acquired using MRV technique without
intravenous contrast.

[Series 9: tof_fl2d_paracor · coronal · 2.0mm · 0.98mm/px · 12 of 132 slices shown]
[im 1/132]
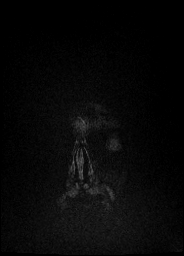
[im 12/132]
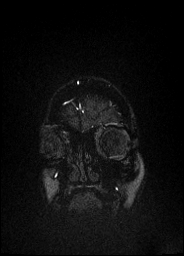
[im 24/132]
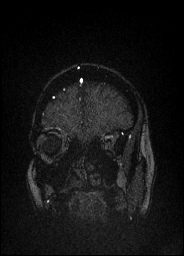
[im 36/132]
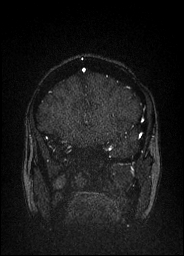
[im 48/132]
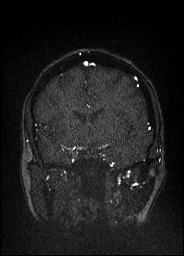
[im 60/132]
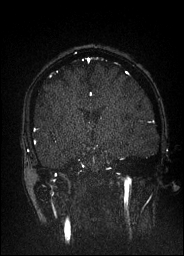
[im 72/132]
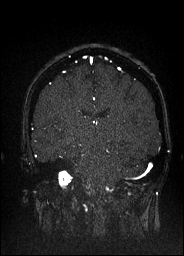
[im 84/132]
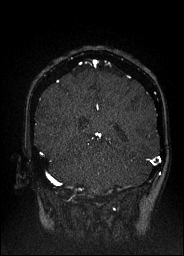
[im 96/132]
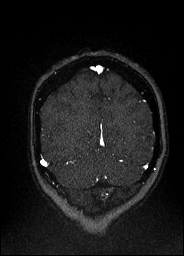
[im 108/132]
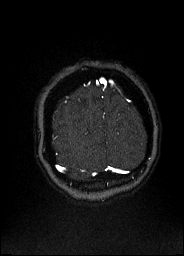
[im 120/132]
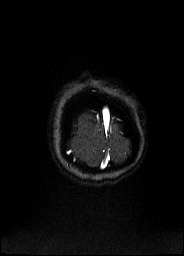
[im 132/132]
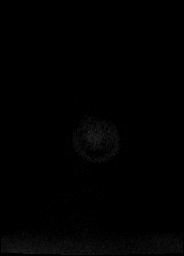

[Series 13: venous inhance coronal · coronal · portal-venous · 0.9mm · 0.57mm/px · 18 of 208 slices shown]
[im 1/208]
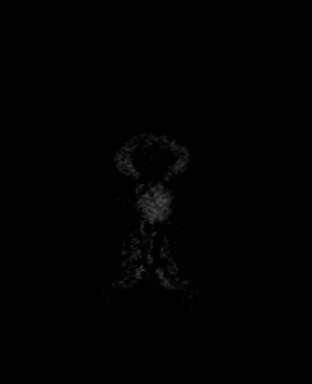
[im 13/208]
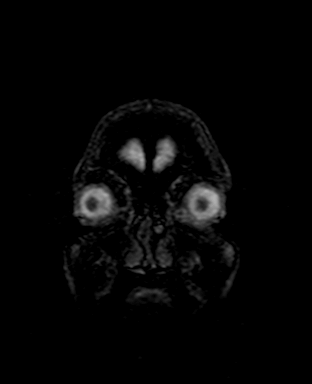
[im 25/208]
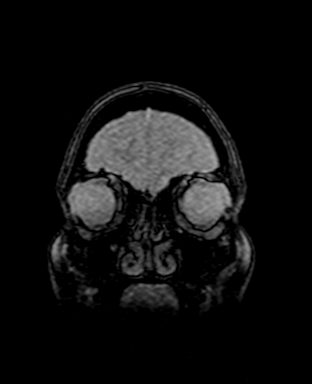
[im 37/208]
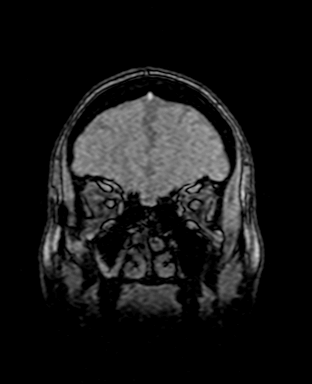
[im 49/208]
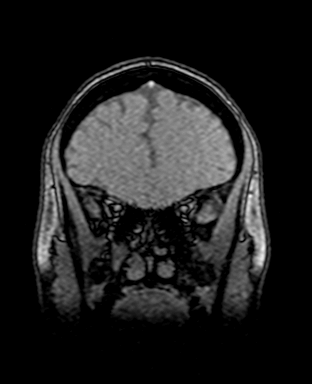
[im 61/208]
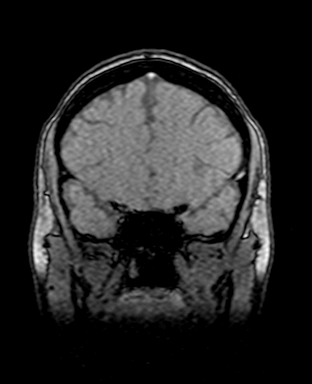
[im 74/208]
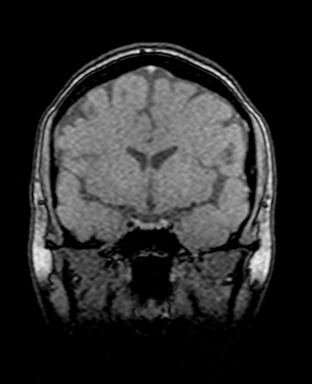
[im 86/208]
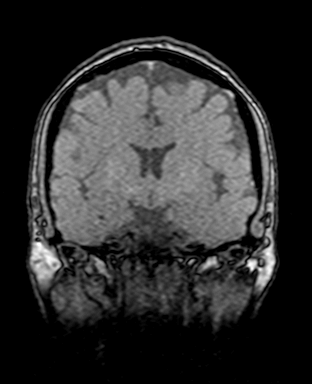
[im 98/208]
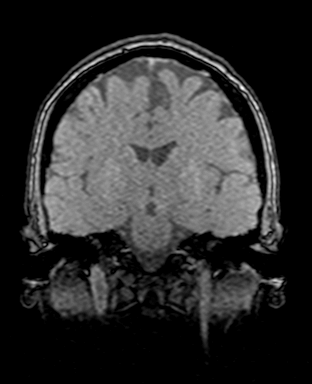
[im 110/208]
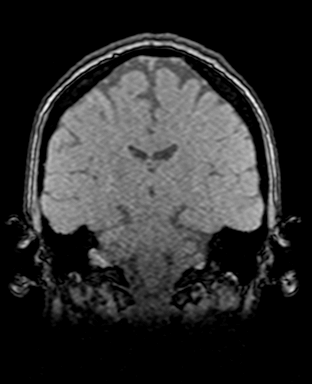
[im 122/208]
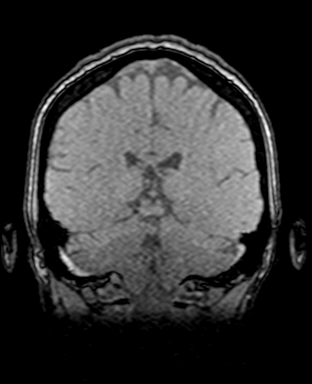
[im 134/208]
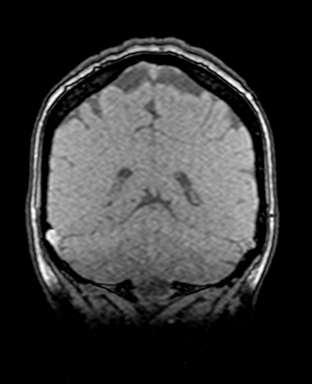
[im 147/208]
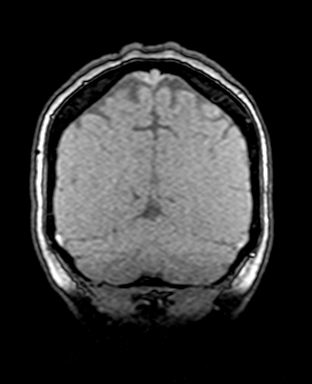
[im 159/208]
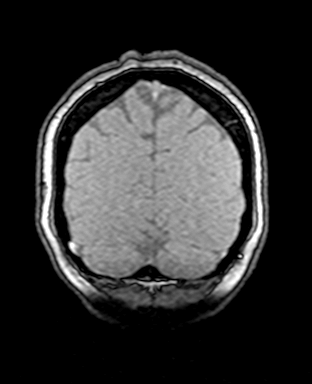
[im 171/208]
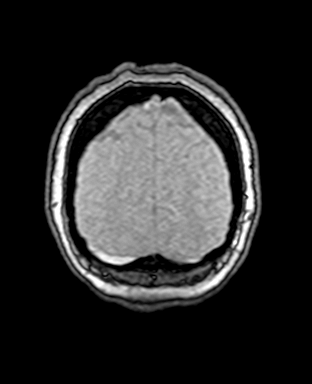
[im 183/208]
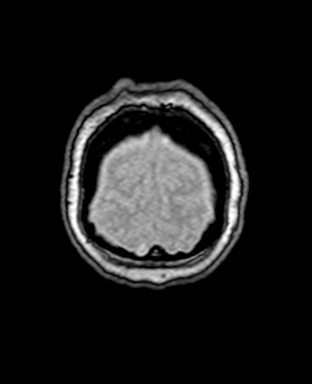
[im 195/208]
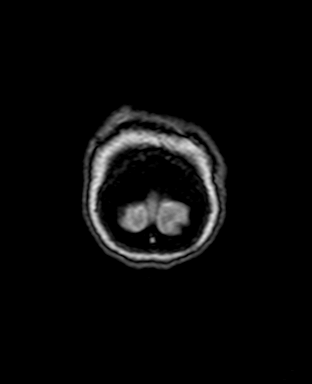
[im 208/208]
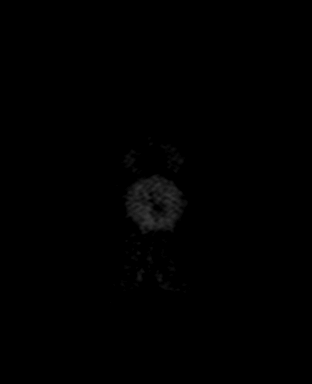

[Series 14: venous inhance coronal_msum · coronal · portal-venous · 0.9mm · 0.57mm/px · 17 of 200 slices shown]
[im 1/200]
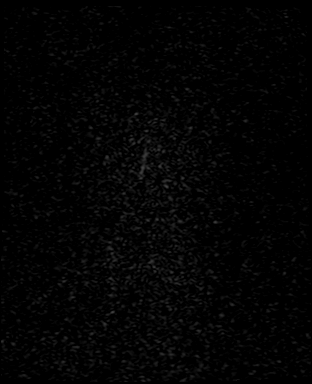
[im 12/200]
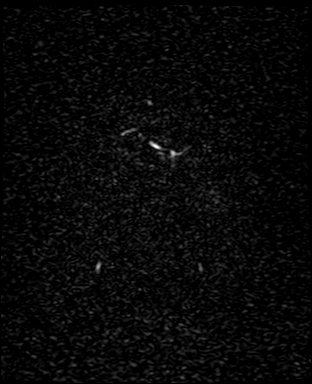
[im 24/200]
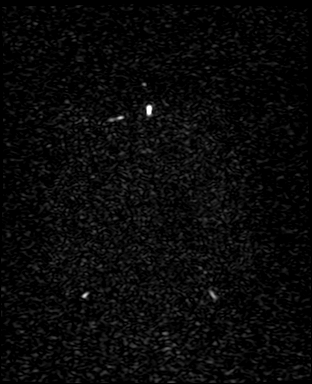
[im 36/200]
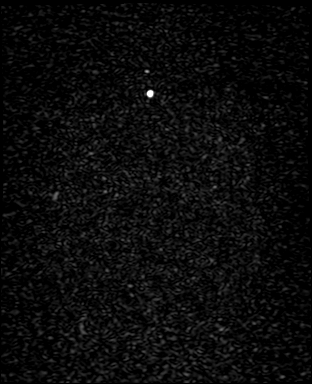
[im 47/200]
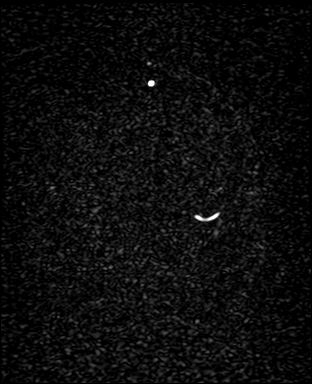
[im 59/200]
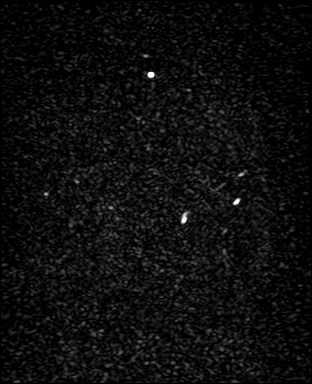
[im 71/200]
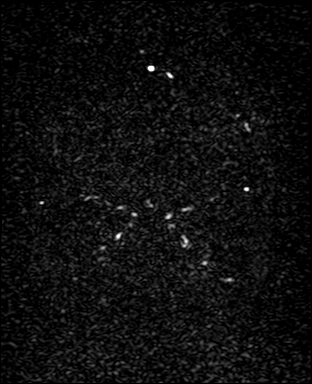
[im 82/200]
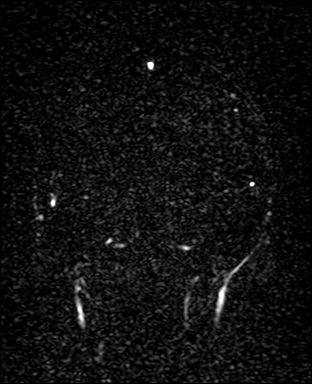
[im 94/200]
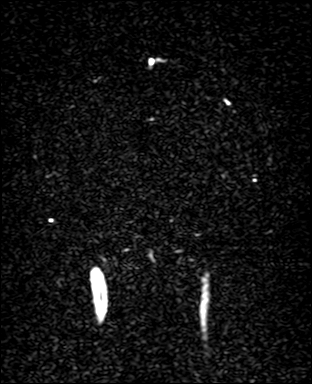
[im 106/200]
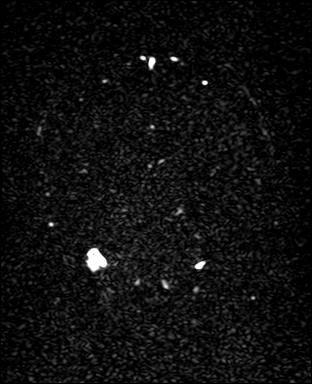
[im 118/200]
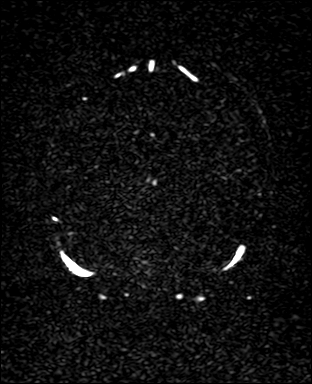
[im 129/200]
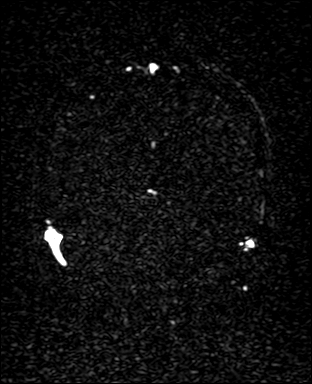
[im 141/200]
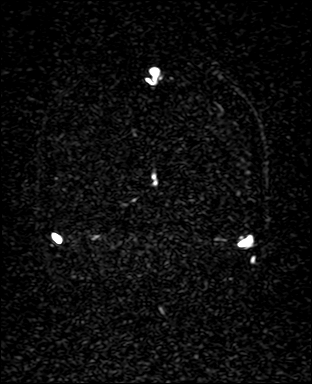
[im 153/200]
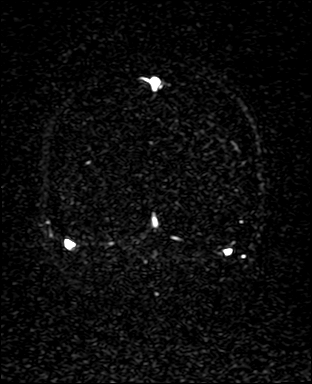
[im 164/200]
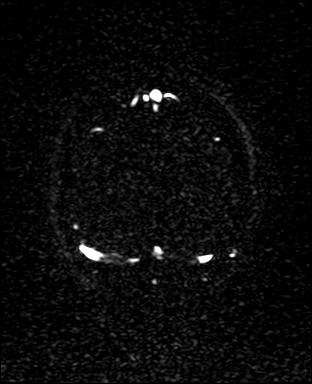
[im 176/200]
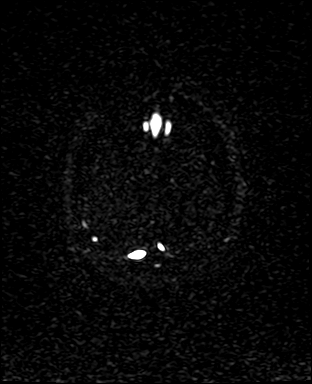
[im 188/200]
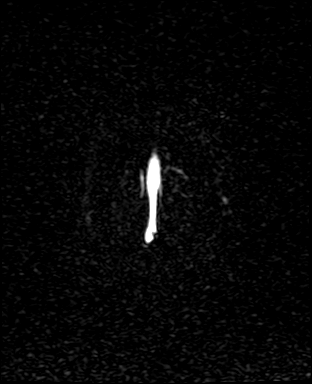

[47 of 48 positions shown; findings below may reference images not displayed]

FINDINGS: No evidence of dural venous sinus thrombosis. Patent superior
sagittal sinus, transverse and sigmoid sinuses, jugular bulbs, and
straight sinus. The visualized deep cerebral veins and cortical
draining veins are patent. Discrete, rounded filling defects within
the superior sagittal sinus and distal left transverse sinus are
compatible with arachnoid granulations.
IMPRESSION: No evidence of dural venous sinus thrombosis.

## 2023-04-17 IMAGING — DX DG ABDOMEN 1V
1 series · 1 of 1 positions shown · non-contrast
Comparison: None.

CLINICAL DATA: Nausea and vomiting.

EXAM:
ABDOMEN - 1 VIEW

[abdomen]
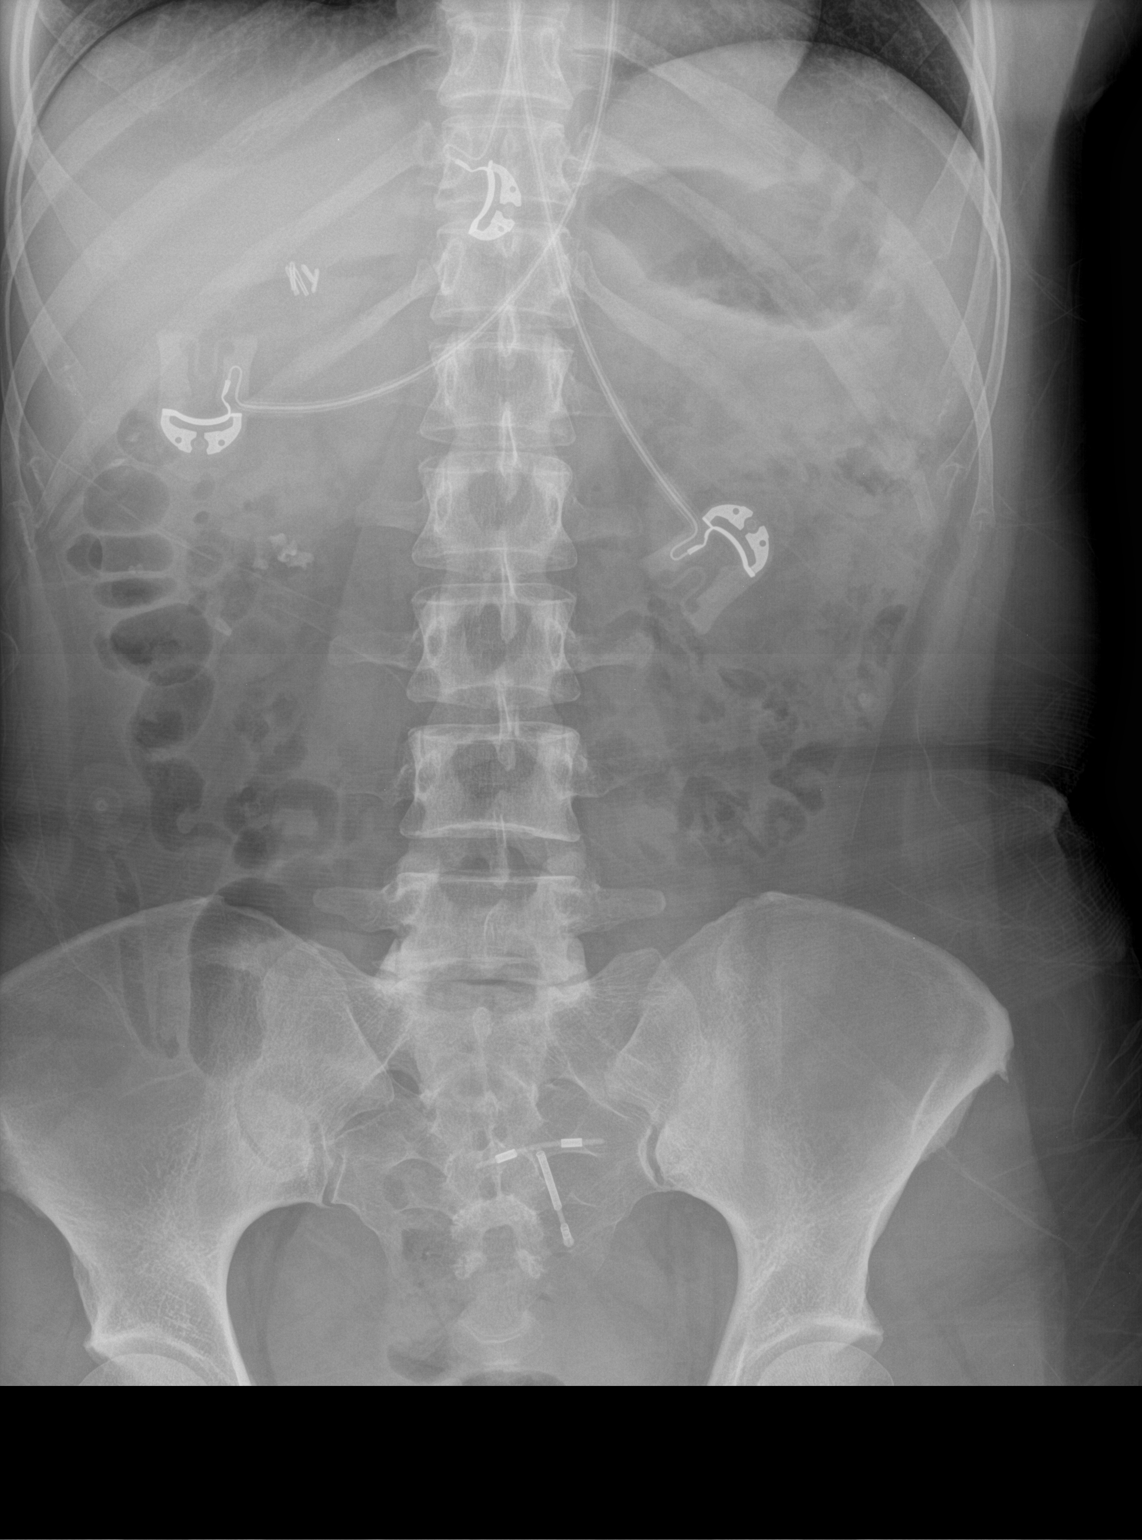

[1 of 1 positions shown; findings below may reference images not displayed]

FINDINGS: Nonobstructive bowel gas pattern. Multiple calcifications within the
RIGHT upper abdomen, presumably renal stones, largest measuring
approximately 6 mm. Cholecystectomy clips in the RIGHT upper
quadrant. IUD appropriately positioned in the midline pelvis.

No evidence of abnormal fluid collection or free intraperitoneal
air. Visualized osseous structures are unremarkable.
IMPRESSION: 1. Nonobstructive bowel gas pattern.
2. Multiple calcifications within the RIGHT upper abdomen,
presumably renal stones, largest measuring approximately 6 mm.

## 2023-04-21 ENCOUNTER — Other Ambulatory Visit: Payer: Self-pay | Admitting: Internal Medicine

## 2023-06-22 ENCOUNTER — Other Ambulatory Visit: Payer: Self-pay | Admitting: Internal Medicine

## 2023-07-17 ENCOUNTER — Encounter: Payer: Self-pay | Admitting: Internal Medicine

## 2023-09-04 ENCOUNTER — Telehealth: Payer: Self-pay | Admitting: Internal Medicine

## 2023-09-09 NOTE — Telephone Encounter (Signed)
Patient has been scheduled

## 2023-10-20 ENCOUNTER — Other Ambulatory Visit: Payer: Self-pay

## 2023-10-20 DIAGNOSIS — Z13220 Encounter for screening for lipoid disorders: Secondary | ICD-10-CM

## 2023-10-20 DIAGNOSIS — Z79899 Other long term (current) drug therapy: Secondary | ICD-10-CM

## 2023-10-20 DIAGNOSIS — E559 Vitamin D deficiency, unspecified: Secondary | ICD-10-CM

## 2023-10-21 LAB — COMPREHENSIVE METABOLIC PANEL
ALT: 21 [IU]/L (ref 0–32)
AST: 26 [IU]/L (ref 0–40)
Albumin: 4 g/dL (ref 3.9–4.9)
Alkaline Phosphatase: 90 [IU]/L (ref 44–121)
BUN/Creatinine Ratio: 13 (ref 9–23)
BUN: 9 mg/dL (ref 6–24)
Bilirubin Total: 0.4 mg/dL (ref 0.0–1.2)
CO2: 22 mmol/L (ref 20–29)
Calcium: 8.3 mg/dL — ABNORMAL LOW (ref 8.7–10.2)
Chloride: 104 mmol/L (ref 96–106)
Creatinine, Ser: 0.69 mg/dL (ref 0.57–1.00)
Globulin, Total: 2.7 g/dL (ref 1.5–4.5)
Glucose: 85 mg/dL (ref 70–99)
Potassium: 4.1 mmol/L (ref 3.5–5.2)
Sodium: 138 mmol/L (ref 134–144)
Total Protein: 6.7 g/dL (ref 6.0–8.5)
eGFR: 111 mL/min/{1.73_m2} (ref >59)

## 2023-10-21 LAB — CBC WITH DIFFERENTIAL/PLATELET
Basophils Absolute: 0 10*3/uL (ref 0.0–0.2)
Basos: 1 %
EOS (ABSOLUTE): 0 10*3/uL (ref 0.0–0.4)
Eos: 1 %
Hematocrit: 40.3 % (ref 34.0–46.6)
Hemoglobin: 13.4 g/dL (ref 11.1–15.9)
Immature Grans (Abs): 0 10*3/uL (ref 0.0–0.1)
Immature Granulocytes: 1 %
Lymphocytes Absolute: 1.2 10*3/uL (ref 0.7–3.1)
Lymphs: 28 %
MCH: 30.5 pg (ref 26.6–33.0)
MCHC: 33.3 g/dL (ref 31.5–35.7)
MCV: 92 fL (ref 79–97)
Monocytes Absolute: 0.4 10*3/uL (ref 0.1–0.9)
Monocytes: 10 %
Neutrophils Absolute: 2.4 10*3/uL (ref 1.4–7.0)
Neutrophils: 59 %
Platelets: 223 10*3/uL (ref 150–450)
RBC: 4.39 x10E6/uL (ref 3.77–5.28)
RDW: 12.9 % (ref 11.7–15.4)
WBC: 4.1 10*3/uL (ref 3.4–10.8)

## 2023-10-21 LAB — LIPID PANEL W/O CHOL/HDL RATIO
Cholesterol, Total: 151 mg/dL (ref 100–199)
HDL: 31 mg/dL — ABNORMAL LOW (ref >39)
LDL Chol Calc (NIH): 93 mg/dL (ref 0–99)
Triglycerides: 152 mg/dL — ABNORMAL HIGH (ref 0–149)
VLDL Cholesterol Cal: 27 mg/dL (ref 5–40)

## 2023-10-21 LAB — VITAMIN D 25 HYDROXY (VIT D DEFICIENCY, FRACTURES): Vit D, 25-Hydroxy: 21.3 ng/mL — ABNORMAL LOW (ref 30.0–100.0)

## 2023-10-27 ENCOUNTER — Ambulatory Visit: Payer: Self-pay | Admitting: Internal Medicine

## 2023-10-27 ENCOUNTER — Encounter: Payer: Self-pay | Admitting: Internal Medicine

## 2023-10-27 ENCOUNTER — Other Ambulatory Visit: Payer: Self-pay | Admitting: Internal Medicine

## 2023-10-27 VITALS — BP 132/80 | HR 68 | Resp 16 | Ht 61.0 in | Wt 148.0 lb

## 2023-10-27 DIAGNOSIS — M329 Systemic lupus erythematosus, unspecified: Secondary | ICD-10-CM

## 2023-10-27 DIAGNOSIS — E559 Vitamin D deficiency, unspecified: Secondary | ICD-10-CM

## 2023-10-27 DIAGNOSIS — Z0000 Encounter for general adult medical examination without abnormal findings: Secondary | ICD-10-CM

## 2023-10-27 DIAGNOSIS — Z8742 Personal history of other diseases of the female genital tract: Secondary | ICD-10-CM

## 2023-10-27 DIAGNOSIS — Z1231 Encounter for screening mammogram for malignant neoplasm of breast: Secondary | ICD-10-CM

## 2023-10-27 DIAGNOSIS — Z124 Encounter for screening for malignant neoplasm of cervix: Secondary | ICD-10-CM

## 2023-10-27 DIAGNOSIS — Z9189 Other specified personal risk factors, not elsewhere classified: Secondary | ICD-10-CM

## 2023-10-27 DIAGNOSIS — Z23 Encounter for immunization: Secondary | ICD-10-CM

## 2023-10-27 MED ORDER — VITAMIN D3 25 MCG (1000 UT) PO CAPS: ORAL_CAPSULE | ORAL | Status: AC

## 2023-10-27 NOTE — Progress Notes (Unsigned)
Subjective:    Patient ID: Audrey [name], female   DOB: [birth year], 42 y.o.   MRN: [id number]   HPI  Audrey [name] interprets  CPE with pap  1.  Pap:  Last 01/2020 with benign reparative changes.  History of abnormal pap 18 years ago.    2.  Mammogram:  Last 04/2022 and normal.  No family history of breast cancer.    3.  Osteoprevention:  1 to 2 servings of yogurt and milk daily.  She is taking Calcium and vitamin D.  She does not know how much calcium she is taking daily.  She has a history of Vitamin D deficiency and recent level remains low at 21.3.  Walks daily, though has not been good with this over the holiday.    4.  Guaiac Cards/FIT:  Last performed in 2020 and negative.  Has not returned sample to clinic since  5.  Colonoscopy:  Never.  No family history of colon cancer.    6.  Immunizations:  Has not had influenza nor COVID booster this year.   Immunization History  Administered Date(s) Administered  . Fluad Quad(high Dose 65+) 12/05/2021  . Influenza Inj Mdck Quad Pf 10/19/2018  . Influenza,inj,Quad PF,6+ Mos 02/26/2018  . Influenza-Unspecified 09/25/2019, 08/13/2020, 09/24/2022  . Moderna Covid-19 Fall Seasonal Vaccine 108yrs & older 10/07/2022  . Moderna Covid-19 Vaccine Bivalent Booster 25yrs & up 12/05/2021  . Moderna Sars-Covid-2 Vaccination 02/13/2020, 03/12/2020, 12/03/2020  . Pneumococcal Polysaccharide-23 04/04/2022  . Td 05/23/2009  . Tdap 03/11/2015  . Zoster Recombinant(Shingrix) 06/04/2022, 12/12/2022     7.  Glucose/Cholesterol:  glucose recently fine.  Still with dyslipidemia with low HDL and now high triglycerides.    Lipid Panel     Component Value Date/Time   CHOL 151 10/20/2023 0826   TRIG 152 (H) 10/20/2023 0826   HDL 31 (L) 10/20/2023 0826   CHOLHDL 3.9 Ratio 05/23/2009 2137   VLDL 22 05/23/2009 2137   LDLCALC 93 10/20/2023 0826   LABVLDL 27 10/20/2023 0826     Current Meds  Medication Sig  . calcium-vitamin D (OSCAL  WITH D) 500-200 MG-UNIT tablet Take 2 tablets by mouth daily with breakfast.  . ferrous gluconate (FERGON) 324 MG tablet Take 1 tablet (324 mg total) by mouth 2 (two) times daily with a meal.  . fluticasone (FLONASE) 50 MCG/ACT nasal spray Place 2 sprays into both nostrils daily.  . folic acid (FOLVITE) 1 MG tablet Take 1 mg by mouth daily.  . hydroxychloroquine (PLAQUENIL) 200 MG tablet 2 tabs by mouth once daily  . ibuprofen (ADVIL) 400 MG tablet Take 1 tablet (400 mg total) by mouth every 8 (eight) hours as needed for fever, headache or moderate pain.  Marland Kitchen levonorgestrel (MIRENA) 20 MCG/24HR IUD 1 each by Intrauterine route once. 10 years  . methotrexate (RHEUMATREX) 2.5 MG tablet 8 tabs by mouth every Friday.  . sertraline (ZOLOFT) 100 MG tablet Take 1/2 (one-half) tablet by mouth once daily   No Known Allergies  Past Medical History:  Diagnosis Date  . History of abnormal cervical Pap smear 2006   Had biopsy and normalized on repeat pap 6 months later:  PHD  . Lupus   . Symptomatic anemia 12/03/2018   Past Surgical History:  Procedure Laterality Date  . DILATION AND CURETTAGE OF UTERUS  2007   following SAB  . LAPAROSCOPIC CHOLECYSTECTOMY  2009   Family History  Problem Relation Age of Onset  . Cancer Mother  Esophageal.  . Diabetes Mother   . Hypertension Father   . Irritable bowel syndrome Sister   . Hyperlipidemia Son   . Breast cancer Neg Hx    Social History   Socioeconomic History  . Marital status: Married    Spouse name: Clyda Hurdle  . Number of children: 3  . Years of education: 9  . Highest education level: 9th grade  Occupational History  . Occupation: Housewife  . Occupation: housecleaning service  Tobacco Use  . Smoking status: Never    Passive exposure: Never  . Smokeless tobacco: Never  Vaping Use  . Vaping status: Never Used  Substance and Sexual Activity  . Alcohol use: No  . Drug use: Never  . Sexual activity: Yes    Birth  control/protection: I.U.D.  Other Topics Concern  . Not on file  Social History Narrative   Lives with husband and 3 children kids.   Social Determinants of Health   Financial Resource Strain: Low Risk  (10/27/2023)   Overall Financial Resource Strain (CARDIA)   . Difficulty of Paying Living Expenses: Not very hard  Food Insecurity: No Food Insecurity (10/27/2023)   Hunger Vital Sign   . Worried About Programme researcher, broadcasting/film/video in the Last Year: Never true   . Ran Out of Food in the Last Year: Never true  Transportation Needs: No Transportation Needs (05/15/2022)   PRAPARE - Transportation   . Lack of Transportation (Medical): No   . Lack of Transportation (Non-Medical): No  Physical Activity: Not on file  Stress: Not on file  Social Connections: Unknown (04/04/2022)   Received [facility]   Social Network   . Social Network: Not on file  Intimate Partner Violence: Not At Risk (10/27/2023)   Humiliation, Afraid, Rape, and Kick questionnaire   . Fear of Current or Ex-Partner: No   . Emotionally Abused: No   . Physically Abused: No   . Sexually Abused: No      Review of Systems  HENT:  Negative for dental problem.   Eyes:  Negative for visual disturbance (wears glasses.  Vision fine with current prescription.).  Respiratory:  Negative for shortness of breath.   Cardiovascular:  Negative for chest pain, palpitations and leg swelling.  Gastrointestinal:  Negative for abdominal pain and blood in stool (No melena.).  Genitourinary:  Negative for menstrual problem.  Musculoskeletal:  Positive for arthralgias (Mild pain in finger joints first thing in morning.  No rash.  Has SLE on Plaquenil.  She is getting eyes checked every year).  Neurological:  Negative for weakness and numbness.      Objective:   BP 132/80 (BP Location: Left Arm, Patient Position: Sitting, Cuff Size: Normal)   Pulse 68   Resp 16   Ht 5\' 1"  (1.549 m)   Wt 148 lb (67.1 kg)   LMP 10/21/2023    BMI 27.96 kg/m   Physical Exam   Assessment & Plan   CPE with pap Mammogram for June with [facility] FIT to return in 2 weeks. Influenza vaccination Call back for COVID booster.  2.  Vitamin D deficiency:  D3 1000 units daily and recheck level 3 months.    Encouraged drinking/eating dairy 3-4 times daily and getting back to walking daily.    3.  Dyslipidemia:  discussed eating more fish, nuts, olives, avocados and getting back to walking.

## 2023-10-28 LAB — CYTOLOGY - PAP

## 2023-11-05 ENCOUNTER — Other Ambulatory Visit: Payer: Self-pay

## 2023-11-05 DIAGNOSIS — Z1211 Encounter for screening for malignant neoplasm of colon: Secondary | ICD-10-CM

## 2023-11-05 LAB — POC FIT TEST STOOL: Fecal Occult Blood: NEGATIVE

## 2023-12-04 ENCOUNTER — Encounter: Payer: Self-pay | Admitting: Internal Medicine

## 2023-12-04 DIAGNOSIS — Z9109 Other allergy status, other than to drugs and biological substances: Secondary | ICD-10-CM | POA: Insufficient documentation

## 2024-01-14 ENCOUNTER — Inpatient Hospital Stay: Admission: RE | Admit: 2024-01-14 | Payer: No Typology Code available for payment source | Source: Ambulatory Visit

## 2024-01-21 ENCOUNTER — Ambulatory Visit
Admission: RE | Admit: 2024-01-21 | Discharge: 2024-01-21 | Disposition: A | Discharge status: Home / Self Care | Payer: No Typology Code available for payment source | Source: Ambulatory Visit | Attending: Internal Medicine | Admitting: Internal Medicine

## 2024-01-21 DIAGNOSIS — Z1231 Encounter for screening mammogram for malignant neoplasm of breast: Secondary | ICD-10-CM

## 2024-01-26 ENCOUNTER — Other Ambulatory Visit: Payer: Self-pay

## 2024-01-26 DIAGNOSIS — E559 Vitamin D deficiency, unspecified: Secondary | ICD-10-CM

## 2024-02-06 LAB — VITAMIN D 1,25 DIHYDROXY
Vitamin D 1, 25 (OH)2 Total: 55 pg/mL
Vitamin D2 1, 25 (OH)2: <10 pg/mL
Vitamin D3 1, 25 (OH)2: 52 pg/mL

## 2024-04-17 ENCOUNTER — Other Ambulatory Visit: Payer: Self-pay | Admitting: Internal Medicine

## 2024-04-26 ENCOUNTER — Ambulatory Visit: Payer: Self-pay | Admitting: Internal Medicine

## 2024-04-26 ENCOUNTER — Encounter: Payer: Self-pay | Admitting: Internal Medicine

## 2024-04-26 VITALS — BP 110/80 | HR 64 | Resp 16 | Ht 61.0 in | Wt 145.0 lb

## 2024-04-26 DIAGNOSIS — E559 Vitamin D deficiency, unspecified: Secondary | ICD-10-CM

## 2024-04-26 DIAGNOSIS — Z9109 Other allergy status, other than to drugs and biological substances: Secondary | ICD-10-CM

## 2024-04-26 DIAGNOSIS — M329 Systemic lupus erythematosus, unspecified: Secondary | ICD-10-CM

## 2024-04-26 NOTE — Progress Notes (Signed)
    Subjective:    Patient ID: Audrey [name], female   DOB: [birth year], 43 y.o.   MRN: [id number]   HPI  Audrey [name] inteprets   SLE:  continues on Hydroxychloroquine  400 mg once daily and Methotrexate  eight 19 mg once weekly, which she was taking back in December when having more joint discomfort in her hands.  She feels she is asymptomatic at this point and that the colder weather exacerbates her symptoms.  She does take folic acid 1 mg daily as well.  Had eye exam for Hydroxychloroquine  use with no signs of toxicity on 04/12/2024.    2.  Low vitamin D :  last level with supplementation in March was 55.  She decreased supplementation to 3 times weekly.    3.  Allergies:  no problem this spring with high pollen.  Using Flonase  only 2 times weekly.  Current Meds  Medication Sig   calcium -vitamin D  (OSCAL WITH D) 500-200 MG-UNIT tablet Take 2 tablets by mouth daily with breakfast.   Cholecalciferol (VITAMIN D3) 25 MCG (1000 UT) CAPS 1 cap by mouth daily.   ferrous gluconate  (FERGON) 324 MG tablet Take 1 tablet (324 mg total) by mouth 2 (two) times daily with a meal.   fluticasone  (FLONASE ) 50 MCG/ACT nasal spray Place 2 sprays into both nostrils daily.   folic acid (FOLVITE) 1 MG tablet Take 1 mg by mouth daily.   hydroxychloroquine  (PLAQUENIL ) 200 MG tablet 2 tabs by mouth once daily   ibuprofen  (ADVIL ) 400 MG tablet Take 1 tablet (400 mg total) by mouth every 8 (eight) hours as needed for fever, headache or moderate pain.   levonorgestrel (MIRENA) 20 MCG/24HR IUD 1 each by Intrauterine route once. 10 years   methotrexate  (RHEUMATREX) 2.5 MG tablet 8 tabs by mouth every Friday.   No Known Allergies   Review of Systems    Objective:   BP 110/80 (BP Location: Left Arm, Patient Position: Sitting, Cuff Size: Normal)   Pulse 64   Resp 16   Ht 5\' 1"  (1.549 m)   Wt 145 lb (65.8 kg)   LMP 04/22/2024   SpO2 99%   BMI 27.40 kg/m   Physical Exam NAD HEENT:  PERRL,  EOMI, TMs pearly gray, nasal mucosa without edema.  Throat without injection Neck:  Supple, No adenopathy Chest:  CTA CV:  RRR without murmur or rub.  Radial and DP pulses normal and equal Abd:  S, NT, No HSM or mass, + BS LE:  No edema Skin:  no rash MS:  no swelling or erythema of joints of hands.  Full ROM   Assessment & Plan   SLE:  controlled with Hydroxychloroquine  and methotrexate .  Recent eye exam without concern.    2.  Allergies:  controlled,  Reminded her Flonase  does not work well as needed--to use regularly to prevent symptoms.  3.  Vitamin D  deficiency:  good level in March and decreased supplementation.  Will recheck level in December prior to CPE

## 2024-06-28 ENCOUNTER — Telehealth: Payer: Self-pay | Admitting: Internal Medicine

## 2024-06-28 ENCOUNTER — Other Ambulatory Visit: Payer: Self-pay

## 2024-06-28 DIAGNOSIS — R300 Dysuria: Secondary | ICD-10-CM

## 2024-06-28 LAB — POCT URINALYSIS DIPSTICK
Bilirubin, UA: NEGATIVE
Glucose, UA: NEGATIVE
Ketones, UA: NEGATIVE
Nitrite, UA: NEGATIVE
Protein, UA: NEGATIVE
Spec Grav, UA: <=1.005 — AB (ref 1.010–1.025)
Urobilinogen, UA: 1 U/dL
pH, UA: 6.5 (ref 5.0–8.0)

## 2024-06-28 MED ORDER — CIPROFLOXACIN HCL 500 MG PO TABS: 500.0000 mg | ORAL_TABLET | Freq: Two times a day (BID) | ORAL | 0 refills | Status: AC

## 2024-06-28 NOTE — Telephone Encounter (Signed)
 Per Doctor Adella called patient to notified her that antibiotic medication had been sent to the [facility] pharmacy on [address].  Medication name   ciprofloxacin  (CIPRO ) 500 MG tablet [[id number]] .  Notified patient she is to take 1 tablet (500 mg total) by mouth 2 (two) times daily for 3 days.   Also, notified patient we will call her back with results from the Urine Culture.

## 2024-06-28 NOTE — Progress Notes (Signed)
 UA with moderate Leuks and trace blood.  Sending for culture Cipro  500 mg twice daily for 3 days.

## 2024-06-28 NOTE — Telephone Encounter (Signed)
 Patient needs an appointment for   Patient states for the past three days patient goes to urine and has pain while urinating.   Patient states after urinating she feels like she did not empty completely patient tries to urinate again but does nothing comes out. Patient denies fever .  Patient has not taken anything for symptoms.

## 2024-06-28 NOTE — Progress Notes (Signed)
 See telephone..Urine abnormal

## 2024-06-28 NOTE — Addendum Note (Signed)
 Addended by: ADELLA ALMARIE HERO on: 06/28/2024 05:38 PM   Modules accepted: Orders

## 2024-06-28 NOTE — Telephone Encounter (Signed)
 Patient has been scheduled for a UA today 06/28/24 at 4:00 pm.

## 2024-06-28 NOTE — Addendum Note (Signed)
 Addended by: DASIE VIKI SAILOR on: 06/28/2024 05:02 PM   Modules accepted: Orders

## 2024-10-14 ENCOUNTER — Other Ambulatory Visit: Payer: Self-pay

## 2024-10-18 ENCOUNTER — Other Ambulatory Visit: Payer: Self-pay

## 2024-10-18 DIAGNOSIS — E559 Vitamin D deficiency, unspecified: Secondary | ICD-10-CM

## 2024-10-18 DIAGNOSIS — Z0000 Encounter for general adult medical examination without abnormal findings: Secondary | ICD-10-CM

## 2024-10-19 LAB — COMPREHENSIVE METABOLIC PANEL WITH GFR
ALT: 17 IU/L (ref 0–32)
AST: 25 IU/L (ref 0–40)
Albumin: 4.4 g/dL (ref 3.9–4.9)
Alkaline Phosphatase: 103 IU/L (ref 41–116)
BUN/Creatinine Ratio: 11 (ref 9–23)
BUN: 7 mg/dL (ref 6–24)
Bilirubin Total: 0.4 mg/dL (ref 0.0–1.2)
CO2: 18 mmol/L — ABNORMAL LOW (ref 20–29)
Calcium: 8.8 mg/dL (ref 8.7–10.2)
Chloride: 101 mmol/L (ref 96–106)
Creatinine, Ser: 0.63 mg/dL (ref 0.57–1.00)
Globulin, Total: 2.9 g/dL (ref 1.5–4.5)
Glucose: 79 mg/dL (ref 70–99)
Potassium: 4.2 mmol/L (ref 3.5–5.2)
Sodium: 135 mmol/L (ref 134–144)
Total Protein: 7.3 g/dL (ref 6.0–8.5)
eGFR: 113 mL/min/1.73 (ref >59)

## 2024-10-19 LAB — CBC WITH DIFFERENTIAL/PLATELET
Basophils Absolute: 0 x10E3/uL (ref 0.0–0.2)
Basos: 1 %
EOS (ABSOLUTE): 0 x10E3/uL (ref 0.0–0.4)
Eos: 1 %
Hematocrit: 46 % (ref 34.0–46.6)
Hemoglobin: 15 g/dL (ref 11.1–15.9)
Immature Grans (Abs): 0 x10E3/uL (ref 0.0–0.1)
Immature Granulocytes: 0 %
Lymphocytes Absolute: 1.6 x10E3/uL (ref 0.7–3.1)
Lymphs: 39 %
MCH: 29.6 pg (ref 26.6–33.0)
MCHC: 32.6 g/dL (ref 31.5–35.7)
MCV: 91 fL (ref 79–97)
Monocytes Absolute: 0.4 x10E3/uL (ref 0.1–0.9)
Monocytes: 9 %
Neutrophils Absolute: 2.1 x10E3/uL (ref 1.4–7.0)
Neutrophils: 50 %
Platelets: 222 x10E3/uL (ref 150–450)
RBC: 5.07 x10E6/uL (ref 3.77–5.28)
RDW: 12.9 % (ref 11.7–15.4)
WBC: 4.1 x10E3/uL (ref 3.4–10.8)

## 2024-10-19 LAB — LIPID PANEL
Chol/HDL Ratio: 5.1 ratio — ABNORMAL HIGH (ref 0.0–4.4)
Cholesterol, Total: 163 mg/dL (ref 100–199)
HDL: 32 mg/dL — ABNORMAL LOW (ref >39)
LDL Chol Calc (NIH): 94 mg/dL (ref 0–99)
Triglycerides: 216 mg/dL — ABNORMAL HIGH (ref 0–149)
VLDL Cholesterol Cal: 37 mg/dL (ref 5–40)

## 2024-10-19 LAB — VITAMIN D 25 HYDROXY (VIT D DEFICIENCY, FRACTURES): Vit D, 25-Hydroxy: 16.4 ng/mL — ABNORMAL LOW (ref 30.0–100.0)

## 2024-10-24 ENCOUNTER — Other Ambulatory Visit: Payer: Self-pay

## 2024-10-26 ENCOUNTER — Ambulatory Visit: Payer: Self-pay | Admitting: Internal Medicine

## 2024-10-26 ENCOUNTER — Encounter: Payer: Self-pay | Admitting: Internal Medicine

## 2024-10-26 VITALS — BP 130/80 | HR 58 | Resp 14 | Ht 60.0 in | Wt 153.0 lb

## 2024-10-26 DIAGNOSIS — E785 Hyperlipidemia, unspecified: Secondary | ICD-10-CM

## 2024-10-26 DIAGNOSIS — M329 Systemic lupus erythematosus, unspecified: Secondary | ICD-10-CM

## 2024-10-26 DIAGNOSIS — Z0000 Encounter for general adult medical examination without abnormal findings: Secondary | ICD-10-CM

## 2024-10-26 DIAGNOSIS — Z1231 Encounter for screening mammogram for malignant neoplasm of breast: Secondary | ICD-10-CM

## 2024-10-26 DIAGNOSIS — Z23 Encounter for immunization: Secondary | ICD-10-CM

## 2024-10-26 MED ORDER — FISH OIL 1000 MG PO CAPS: ORAL_CAPSULE | ORAL | Status: AC

## 2024-10-26 NOTE — Progress Notes (Signed)
 Subjective:    Patient ID: Audrey [name], female   DOB: 12/30/1980, 43 y.o.   MRN: [id number]   HPI  Erminio Bloomer interprets  CPE without pap  1.  Pap:  Last 10/2023 and normal.    2.  Mammogram:  Last 12/2023 and normal.  Has not received a letter to reschedule.  No family history of breast cancer.    3.  Osteoprevention:  Vitamin D  level 16.4 recently.  She is not taking her calcium  and vitamin D  regularly.  Lower than last year.  She is working as fish farm manager.  No energy to be active outside of work.  Not going outside due to cold.    4.  Guaiac Cards/FIT:  Last checked 10/2023 and negative.    5.  Colonoscopy:  Never.  No family history of colon cancer  6.  Immunizations:  Has not had influenza or COVID vaccines this year.   Immunization History  Administered Date(s) Administered   Fluad Quad(high Dose 65+) 12/05/2021   Hep A / Hep B 05/09/2015, 06/20/2015, 11/23/2015   Influenza Inj Mdck Quad Pf 10/19/2018   Influenza, Mdck, Trivalent,PF 6+ MOS(egg free) 10/27/2023   Influenza, Seasonal, Injecte, Preservative Fre 10/21/2010   Influenza,inj,Quad PF,6+ Mos 02/26/2018   Influenza-Unspecified 09/25/2019, 08/13/2020, 09/24/2022   MMR 05/09/2015   Moderna Covid-19 Fall Seasonal Vaccine 37yrs & older 10/07/2022   Moderna Covid-19 Vaccine Bivalent Booster 52yrs & up 12/05/2021   Moderna Sars-Covid-2 Vaccination 02/13/2020, 03/12/2020, 12/03/2020   Novel Infuenza-h1n1-09 11/08/2008   Pneumococcal Polysaccharide-23 04/04/2022   Td 05/23/2009   Tdap 03/11/2015   Varicella 05/09/2015   Zoster Recombinant(Shingrix ) 06/04/2022, 12/12/2022     7.  Glucose/Cholesterol:  Fasting glucose fine.  Dyslipidemia with increasing trigs and low HDL.  Total cholesterol and LDL fine.   Lipid Panel     Component Value Date/Time   CHOL 163 10/18/2024 0834   TRIG 216 (H) 10/18/2024 0834   HDL 32 (L) 10/18/2024 0834   CHOLHDL 5.1 (H) 10/18/2024 0834   CHOLHDL 3.9 Ratio  05/23/2009 7862   VLDL 22 05/23/2009 2137   LDLCALC 94 10/18/2024 0834   LABVLDL 37 10/18/2024 0834     Current Meds  Medication Sig   calcium -vitamin D  (OSCAL WITH D) 500-200 MG-UNIT tablet Take 2 tablets by mouth daily with breakfast.   Cholecalciferol (VITAMIN D3) 25 MCG (1000 UT) CAPS 1 cap by mouth daily.   fluticasone  (FLONASE ) 50 MCG/ACT nasal spray Place 2 sprays into both nostrils daily.   folic acid (FOLVITE) 1 MG tablet Take 1 mg by mouth daily.   hydroxychloroquine  (PLAQUENIL ) 200 MG tablet 2 tabs by mouth once daily   ibuprofen  (ADVIL ) 400 MG tablet Take 1 tablet (400 mg total) by mouth every 8 (eight) hours as needed for fever, headache or moderate pain.   levonorgestrel (MIRENA) 20 MCG/24HR IUD 1 each by Intrauterine route once. 10 years   methotrexate  (RHEUMATREX) 2.5 MG tablet 8 tabs by mouth every Friday.   sertraline  (ZOLOFT ) 100 MG tablet Take 1/2 (one-half) tablet by mouth once daily   No Known Allergies  Past Medical History:  Diagnosis Date   Dyslipidemia 2024   History of abnormal cervical Pap smear 2006   Had biopsy and normalized on repeat pap 6 months later:  PHD   Lupus    Panic disorder 2022   Symptomatic anemia 12/03/2018   Past Surgical History:  Procedure Laterality Date   DILATION AND CURETTAGE OF UTERUS  2007  following SAB   LAPAROSCOPIC CHOLECYSTECTOMY  2009   Family History  Problem Relation Age of Onset   Cancer Mother        Esophageal.   Diabetes Mother    Hypertension Father    Irritable bowel syndrome Sister    Hyperlipidemia Son    Breast cancer Neg Hx    Social History   Socioeconomic History   Marital status: Married    Spouse name: Lynda Miles   Number of children: 3   Years of education: 9   Highest education level: 9th grade  Occupational History   Occupation: Housewife   Occupation: equities trader  Tobacco Use   Smoking status: Never    Passive exposure: Never   Smokeless tobacco: Never  Vaping  Use   Vaping status: Never Used  Substance and Sexual Activity   Alcohol use: No   Drug use: Never   Sexual activity: Yes    Birth control/protection: I.U.D.    Comment: IUD expires 2033  Other Topics Concern   Not on file  Social History Narrative   Lives with husband and 3 children kids.   Social Drivers of Corporate Investment Banker Strain: Low Risk  (10/26/2024)   Overall Financial Resource Strain (CARDIA)    Difficulty of Paying Living Expenses: Not hard at all  Food Insecurity: No Food Insecurity (10/26/2024)   Hunger Vital Sign    Worried About Running Out of Food in the Last Year: Never true    Ran Out of Food in the Last Year: Never true  Transportation Needs: No Transportation Needs (10/26/2024)   PRAPARE - Administrator, Civil Service (Medical): No    Lack of Transportation (Non-Medical): No  Physical Activity: Not on file  Stress: Not on file  Social Connections: Unknown (04/04/2022)   Received from [facility]   Social Network    Social Network: Not on file  Intimate Partner Violence: Not At Risk (10/26/2024)   Humiliation, Afraid, Rape, and Kick questionnaire    Fear of Current or Ex-Partner: No    Emotionally Abused: No    Physically Abused: No    Sexually Abused: No      Review of Systems  HENT:  Negative for dental problem (No orange card currently).   Eyes:  Negative for visual disturbance (Gets eye check with [facility] Deborrah at [facility] --switching to yearly.  Taking plaquenil  for SLE.).  Respiratory:  Negative for shortness of breath.   Cardiovascular:  Negative for chest pain, palpitations and leg swelling.  Gastrointestinal:  Negative for abdominal pain and blood in stool (No melena.).       No heartburn.  Musculoskeletal:  Positive for arthralgias (Pain in hands with cold weather when awakens in morning.  She uses gloves rather than mittens when outside.).       Other than hand pain with cold, feels her SLE is well controlled with current  medications.  Skin:  Negative for rash.  Neurological:  Negative for weakness and numbness.  Psychiatric/Behavioral:  Negative for dysphoric mood. The patient is not nervous/anxious (No panic attacks.).        Objective:   BP 130/80 (BP Location: Right Arm, Patient Position: Sitting, Cuff Size: Normal)   Pulse (!) 58   Resp 14   Ht 5' (1.524 m)   Wt 153 lb (69.4 kg)   BMI 29.88 kg/m   Physical Exam HENT:     Head: Normocephalic and atraumatic.     Right  Ear: Tympanic membrane, ear canal and external ear normal.     Left Ear: Tympanic membrane, ear canal and external ear normal.     Nose: Nose normal.     Mouth/Throat:     Mouth: Mucous membranes are moist.     Pharynx: Oropharynx is clear.  Eyes:     Extraocular Movements: Extraocular movements intact.     Conjunctiva/sclera: Conjunctivae normal.     Pupils: Pupils are equal, round, and reactive to light.     Comments: Discs sharp  Neck:     Thyroid: No thyroid mass or thyromegaly.  Cardiovascular:     Rate and Rhythm: Normal rate and regular rhythm.     Heart sounds: S1 normal and S2 normal. No murmur heard.    No friction rub. No S3 or S4 sounds.     Comments: No carotid bruits.  Carotid, radial, femoral, DP and PT pulses normal and equal.   Pulmonary:     Effort: Pulmonary effort is normal.     Breath sounds: Normal breath sounds and air entry.  Chest:  Breasts:    Right: No inverted nipple, mass or nipple discharge.     Left: No inverted nipple, mass or nipple discharge.  Abdominal:     General: Bowel sounds are normal.     Palpations: Abdomen is soft. There is no hepatomegaly, splenomegaly or mass.     Tenderness: There is no abdominal tenderness.     Hernia: No hernia is present.  Genitourinary:    General: Normal vulva.     Comments: Normal external female genitalia Having some blood flow--states period started yesterday No uterine or adnexal mass or tenderness. Musculoskeletal:        General: No  swelling or tenderness. Normal range of motion.     Cervical back: Normal range of motion and neck supple.     Right lower leg: No edema.     Left lower leg: No edema.  Lymphadenopathy:     Head:     Right side of head: No submental or submandibular adenopathy.     Left side of head: No submental or submandibular adenopathy.     Cervical: No cervical adenopathy.     Upper Body:     Right upper body: No supraclavicular or axillary adenopathy.     Left upper body: No supraclavicular or axillary adenopathy.     Lower Body: No right inguinal adenopathy. No left inguinal adenopathy.  Skin:    General: Skin is warm.     Capillary Refill: Capillary refill takes less than 2 seconds.     Findings: No rash.  Neurological:     General: No focal deficit present.     Mental Status: She is alert and oriented to person, place, and time.     Cranial Nerves: Cranial nerves 2-12 are intact.     Sensory: Sensation is intact.     Motor: Motor function is intact.     Coordination: Coordination is intact.     Gait: Gait is intact.     Comments: Unable to obtain reflexes--difficulty relaxing.  Psychiatric:        Mood and Affect: Mood normal.        Behavior: Behavior normal.      Assessment & Plan   CPE without pap Flucelvax and list for COVID vaccine. FIT to return in 2 weeks. Mammogram in Feb 2026.  2.  SLE:  controlled.  R[facility]um and [facility]ho at [facility]  3.  Vitamin  D deficiency:  encouraged her to take her D3 daily and will repeat level in 3 months.  Suspect she will need chronic supplementation.  Encouraged her to get outside--bundle up with long johns, use mittens during cold months rather than gloves.    4.  Panic Disorder:  controlled with Sertraline .  5.  Dyslipidemia:  adding fish oil 10000 mg twice daily.

## 2024-11-03 ENCOUNTER — Other Ambulatory Visit (INDEPENDENT_AMBULATORY_CARE_PROVIDER_SITE_OTHER): Payer: Self-pay

## 2024-11-03 DIAGNOSIS — Z0000 Encounter for general adult medical examination without abnormal findings: Secondary | ICD-10-CM

## 2024-11-03 LAB — POC FIT TEST STOOL: Fecal Occult Blood: NEGATIVE

## 2024-12-28 ENCOUNTER — Other Ambulatory Visit: Payer: Self-pay | Admitting: Obstetrics and Gynecology

## 2024-12-28 DIAGNOSIS — Z1231 Encounter for screening mammogram for malignant neoplasm of breast: Secondary | ICD-10-CM

## 2025-01-26 ENCOUNTER — Other Ambulatory Visit: Payer: Self-pay

## 2025-03-02 ENCOUNTER — Encounter

## 2025-03-02 ENCOUNTER — Ambulatory Visit

## 2025-04-27 ENCOUNTER — Ambulatory Visit: Payer: Self-pay | Admitting: Internal Medicine

## 2025-10-27 ENCOUNTER — Other Ambulatory Visit: Payer: Self-pay | Admitting: Internal Medicine

## 2025-10-31 ENCOUNTER — Encounter: Payer: Self-pay | Admitting: Internal Medicine
